# Patient Record
Sex: Male | Born: 1965 | Race: White | Hispanic: No | Marital: Married | Smoking: Never smoker
Health system: Southern US, Community
[De-identification: ages and names within clinical notes are randomized; demographics above are authoritative.]

## PROBLEM LIST (undated history)

## (undated) DIAGNOSIS — G4730 Sleep apnea, unspecified: Secondary | ICD-10-CM

## (undated) DIAGNOSIS — I10 Essential (primary) hypertension: Secondary | ICD-10-CM

## (undated) DIAGNOSIS — E119 Type 2 diabetes mellitus without complications: Secondary | ICD-10-CM

## (undated) DIAGNOSIS — I4891 Unspecified atrial fibrillation: Secondary | ICD-10-CM

## (undated) DIAGNOSIS — J342 Deviated nasal septum: Secondary | ICD-10-CM

## (undated) DIAGNOSIS — Z973 Presence of spectacles and contact lenses: Secondary | ICD-10-CM

## (undated) HISTORY — DX: Unspecified atrial fibrillation: I48.91

## (undated) HISTORY — PX: HERNIA REPAIR: SHX51

---

## 1986-08-09 HISTORY — PX: ANKLE FRACTURE SURGERY: SHX122

## 2013-11-19 ENCOUNTER — Ambulatory Visit (HOSPITAL_COMMUNITY)
Admission: RE | Admit: 2013-11-19 | Discharge: 2013-11-19 | Disposition: A | Discharge status: Home / Self Care | Payer: BC Managed Care – PPO | Source: Ambulatory Visit | Attending: Internal Medicine | Admitting: Internal Medicine

## 2013-11-19 ENCOUNTER — Other Ambulatory Visit (HOSPITAL_COMMUNITY): Payer: Self-pay | Admitting: Internal Medicine

## 2013-11-19 DIAGNOSIS — R911 Solitary pulmonary nodule: Secondary | ICD-10-CM | POA: Insufficient documentation

## 2013-11-19 DIAGNOSIS — R059 Cough, unspecified: Secondary | ICD-10-CM

## 2013-11-19 DIAGNOSIS — R042 Hemoptysis: Secondary | ICD-10-CM

## 2013-11-19 DIAGNOSIS — J438 Other emphysema: Secondary | ICD-10-CM | POA: Insufficient documentation

## 2013-11-19 DIAGNOSIS — R509 Fever, unspecified: Secondary | ICD-10-CM | POA: Insufficient documentation

## 2013-11-19 DIAGNOSIS — R918 Other nonspecific abnormal finding of lung field: Secondary | ICD-10-CM | POA: Insufficient documentation

## 2013-11-19 DIAGNOSIS — R05 Cough: Secondary | ICD-10-CM

## 2013-11-20 ENCOUNTER — Other Ambulatory Visit (HOSPITAL_COMMUNITY): Payer: Self-pay | Admitting: Internal Medicine

## 2013-11-20 DIAGNOSIS — J189 Pneumonia, unspecified organism: Secondary | ICD-10-CM

## 2013-11-22 ENCOUNTER — Ambulatory Visit (HOSPITAL_COMMUNITY): Payer: BC Managed Care – PPO

## 2015-05-16 ENCOUNTER — Other Ambulatory Visit (HOSPITAL_COMMUNITY): Payer: Self-pay | Admitting: Respiratory Therapy

## 2015-05-16 DIAGNOSIS — G4730 Sleep apnea, unspecified: Secondary | ICD-10-CM

## 2015-08-27 ENCOUNTER — Institutional Professional Consult (permissible substitution): Payer: Self-pay | Admitting: Neurology

## 2015-09-06 ENCOUNTER — Ambulatory Visit (HOSPITAL_BASED_OUTPATIENT_CLINIC_OR_DEPARTMENT_OTHER): Payer: BLUE CROSS/BLUE SHIELD | Attending: Pulmonary Disease | Admitting: Sleep Medicine

## 2015-09-06 VITALS — Ht 71.0 in | Wt 330.0 lb

## 2015-09-06 DIAGNOSIS — I493 Ventricular premature depolarization: Secondary | ICD-10-CM | POA: Insufficient documentation

## 2015-09-06 DIAGNOSIS — G4730 Sleep apnea, unspecified: Secondary | ICD-10-CM

## 2015-09-06 DIAGNOSIS — G4733 Obstructive sleep apnea (adult) (pediatric): Secondary | ICD-10-CM

## 2015-09-12 NOTE — Sleep Study (Signed)
  Hissop A. Merlene Laughter, MD     [email or web address]             NOCTURNAL POLYSOMNOGRAPHY   LOCATION: [facility]  Patient Name: Kenneth Bridges, Kenneth Bridges Date: 09/06/2015 Gender: Not Specified D.O.B: 1966/07/08 Age (years): 3 Referring Provider: Not Available Height (inches): 71 Interpreting Physician: Phillips Odor MD, ABSM Weight (lbs): 330 RPSGT: Rosebud Poles BMI: 46 MRN: [id number] Neck Size: 21.00 CLINICAL INFORMATION Sleep Study Type: Split Night CPAP Indication for sleep study: N/A Epworth Sleepiness Score: SLEEP STUDY TECHNIQUE As per the AASM Manual for the Scoring of Sleep and Associated Events v2.3 (April 2016) with a hypopnea requiring 4% desaturations. The channels recorded and monitored were frontal, central and occipital EEG, electrooculogram (EOG), submentalis EMG (chin), nasal and oral airflow, thoracic and abdominal wall motion, anterior tibialis EMG, snore microphone, electrocardiogram, and pulse oximetry. Continuous positive airway pressure (CPAP) was initiated when the patient met split night criteria and was titrated according to treat sleep-disordered breathing. MEDICATIONS Medications taken by the patient : N/A Medications administered by patient during sleep study : No sleep medicine administered. Prior to Admission medications   Not on File    RESPIRATORY PARAMETERS Diagnostic Total AHI (/hr): 82.3 RDI (/hr): 82.3 OA Index (/hr): 30.3 CA Index (/hr): 0.0 REM AHI (/hr): 76.4 NREM AHI (/hr): 83.1 Supine AHI (/hr): 81.7 Non-supine AHI (/hr): 84.71 Min O2 Sat (%): 70.00 Mean O2 (%): 89.72 Time below 88% (min): 53.1     Titration Optimal Pressure (cm): 13 AHI at Optimal Pressure (/hr): 11 Min O2 at Optimal Pressure (%): 89.00 Supine % at Optimal (%): N/A Sleep % at Optimal (%): N/A     SLEEP ARCHITECTURE The recording time for the entire night was 399.1 minutes. During a baseline period of 164.1 minutes, the patient slept for  148.7 minutes in REM and nonREM, yielding a sleep efficiency of 90.6%. Sleep onset after lights out was 7.9 minutes with a REM latency of 90.5 minutes. The patient spent 8.74% of the night in stage N1 sleep, 80.16% in stage N2 sleep, 0.00% in stage N3 and 11.10% in REM. During the titration period of 224.3 minutes, the patient slept for 203.7 minutes in REM and nonREM, yielding a sleep efficiency of 90.8%. Sleep onset after CPAP initiation was 6.7 minutes with a REM latency of 60.0 minutes. The patient spent 10.56% of the night in stage N1 sleep, 27.57% in stage N2 sleep, 26.52% in stage N3 and 35.35% in REM. CARDIAC DATA The 2 lead EKG demonstrated sinus rhythm. The mean heart rate was 71.52 beats per minute. Other EKG findings include: PVCs. LEG MOVEMENT DATA The total Periodic Limb Movements of Sleep (PLMS) were 1. The PLMS index was 0.17.  IMPRESSIONS - Severe obstructive sleep apnea occurred during the diagnostic portion of the study (AHI = 82.3/hour). The optimal CPAP is 13.   Delano Metz, MD Diplomate, American Board of Sleep Medicine.

## 2015-11-23 DIAGNOSIS — G4733 Obstructive sleep apnea (adult) (pediatric): Secondary | ICD-10-CM | POA: Diagnosis not present

## 2015-12-01 DIAGNOSIS — G4733 Obstructive sleep apnea (adult) (pediatric): Secondary | ICD-10-CM | POA: Diagnosis not present

## 2015-12-04 DIAGNOSIS — E291 Testicular hypofunction: Secondary | ICD-10-CM | POA: Diagnosis not present

## 2015-12-04 DIAGNOSIS — I10 Essential (primary) hypertension: Secondary | ICD-10-CM | POA: Diagnosis not present

## 2015-12-04 DIAGNOSIS — R7301 Impaired fasting glucose: Secondary | ICD-10-CM | POA: Diagnosis not present

## 2015-12-09 DIAGNOSIS — R7301 Impaired fasting glucose: Secondary | ICD-10-CM | POA: Diagnosis not present

## 2015-12-09 DIAGNOSIS — I10 Essential (primary) hypertension: Secondary | ICD-10-CM | POA: Diagnosis not present

## 2015-12-09 DIAGNOSIS — E782 Mixed hyperlipidemia: Secondary | ICD-10-CM | POA: Diagnosis not present

## 2015-12-09 DIAGNOSIS — E291 Testicular hypofunction: Secondary | ICD-10-CM | POA: Diagnosis not present

## 2015-12-23 DIAGNOSIS — G4733 Obstructive sleep apnea (adult) (pediatric): Secondary | ICD-10-CM | POA: Diagnosis not present

## 2016-01-22 DIAGNOSIS — G4733 Obstructive sleep apnea (adult) (pediatric): Secondary | ICD-10-CM | POA: Diagnosis not present

## 2016-01-22 DIAGNOSIS — J343 Hypertrophy of nasal turbinates: Secondary | ICD-10-CM | POA: Insufficient documentation

## 2016-01-22 DIAGNOSIS — J342 Deviated nasal septum: Secondary | ICD-10-CM | POA: Diagnosis not present

## 2016-01-23 DIAGNOSIS — G4733 Obstructive sleep apnea (adult) (pediatric): Secondary | ICD-10-CM | POA: Diagnosis not present

## 2016-02-04 ENCOUNTER — Other Ambulatory Visit: Payer: Self-pay | Admitting: Otolaryngology

## 2016-02-04 ENCOUNTER — Encounter (HOSPITAL_COMMUNITY): Payer: Self-pay

## 2016-02-04 ENCOUNTER — Encounter (HOSPITAL_COMMUNITY)
Admission: RE | Admit: 2016-02-04 | Discharge: 2016-02-04 | Disposition: A | Discharge status: Home / Self Care | Payer: BLUE CROSS/BLUE SHIELD | Source: Ambulatory Visit | Attending: Otolaryngology | Admitting: Otolaryngology

## 2016-02-04 DIAGNOSIS — J343 Hypertrophy of nasal turbinates: Secondary | ICD-10-CM | POA: Diagnosis not present

## 2016-02-04 DIAGNOSIS — Z01818 Encounter for other preprocedural examination: Secondary | ICD-10-CM | POA: Insufficient documentation

## 2016-02-04 DIAGNOSIS — I10 Essential (primary) hypertension: Secondary | ICD-10-CM | POA: Diagnosis not present

## 2016-02-04 DIAGNOSIS — J342 Deviated nasal septum: Secondary | ICD-10-CM | POA: Insufficient documentation

## 2016-02-04 DIAGNOSIS — Z01812 Encounter for preprocedural laboratory examination: Secondary | ICD-10-CM | POA: Insufficient documentation

## 2016-02-04 HISTORY — DX: Sleep apnea, unspecified: G47.30

## 2016-02-04 HISTORY — DX: Presence of spectacles and contact lenses: Z97.3

## 2016-02-04 HISTORY — DX: Deviated nasal septum: J34.2

## 2016-02-04 HISTORY — DX: Essential (primary) hypertension: I10

## 2016-02-04 LAB — BASIC METABOLIC PANEL
ANION GAP: 9 (ref 5–15)
BUN: 14 mg/dL (ref 6–20)
CALCIUM: 9.3 mg/dL (ref 8.9–10.3)
CO2: 25 mmol/L (ref 22–32)
Chloride: 104 mmol/L (ref 101–111)
Creatinine, Ser: 0.96 mg/dL (ref 0.61–1.24)
Glucose, Bld: 104 mg/dL — ABNORMAL HIGH (ref 65–99)
POTASSIUM: 3.9 mmol/L (ref 3.5–5.1)
Sodium: 138 mmol/L (ref 135–145)

## 2016-02-04 LAB — CBC
HEMATOCRIT: 43.3 % (ref 39.0–52.0)
HEMOGLOBIN: 15.3 g/dL (ref 13.0–17.0)
MCH: 30.5 pg (ref 26.0–34.0)
MCHC: 35.3 g/dL (ref 30.0–36.0)
MCV: 86.3 fL (ref 78.0–100.0)
Platelets: 167 10*3/uL (ref 150–400)
RBC: 5.02 MIL/uL (ref 4.22–5.81)
RDW: 12.9 % (ref 11.5–15.5)
WBC: 8 10*3/uL (ref 4.0–10.5)

## 2016-02-04 NOTE — Progress Notes (Signed)
Pt denies SOB, chest pain, and being under the care of a cardiologist. Pt denies having a stress test, echo and cardiac cath. Pt denies having an EKG and chest x ray within the last 12 months. Pt denies having labs drawn within the last 2 weeks.

## 2016-02-13 ENCOUNTER — Ambulatory Visit (HOSPITAL_COMMUNITY): Payer: BLUE CROSS/BLUE SHIELD | Admitting: Anesthesiology

## 2016-02-13 ENCOUNTER — Encounter (HOSPITAL_COMMUNITY)
Admission: RE | Disposition: A | Discharge status: Home / Self Care | Payer: Self-pay | Source: Ambulatory Visit | Attending: Otolaryngology

## 2016-02-13 ENCOUNTER — Observation Stay (HOSPITAL_COMMUNITY)
Admission: RE | Admit: 2016-02-13 | Discharge: 2016-02-14 | Disposition: A | Discharge status: Home / Self Care | Payer: BLUE CROSS/BLUE SHIELD | Source: Ambulatory Visit | Attending: Otolaryngology | Admitting: Otolaryngology

## 2016-02-13 ENCOUNTER — Encounter (HOSPITAL_COMMUNITY): Payer: Self-pay | Admitting: Surgery

## 2016-02-13 DIAGNOSIS — I10 Essential (primary) hypertension: Secondary | ICD-10-CM | POA: Diagnosis not present

## 2016-02-13 DIAGNOSIS — J343 Hypertrophy of nasal turbinates: Secondary | ICD-10-CM | POA: Diagnosis not present

## 2016-02-13 DIAGNOSIS — Z6841 Body Mass Index (BMI) 40.0 and over, adult: Secondary | ICD-10-CM | POA: Insufficient documentation

## 2016-02-13 DIAGNOSIS — J342 Deviated nasal septum: Principal | ICD-10-CM | POA: Diagnosis present

## 2016-02-13 DIAGNOSIS — G4733 Obstructive sleep apnea (adult) (pediatric): Secondary | ICD-10-CM | POA: Diagnosis not present

## 2016-02-13 HISTORY — PX: NASAL SEPTOPLASTY W/ TURBINOPLASTY: SHX2070

## 2016-02-13 SURGERY — SEPTOPLASTY, NOSE, WITH NASAL TURBINATE REDUCTION
Anesthesia: General | Site: Nose | Laterality: Bilateral

## 2016-02-13 MED ORDER — PROPOFOL 10 MG/ML IV BOLUS
INTRAVENOUS | Status: AC
  Filled 2016-02-13: qty 20

## 2016-02-13 MED ORDER — KCL IN DEXTROSE-NACL 20-5-0.45 MEQ/L-%-% IV SOLN
INTRAVENOUS | Status: DC
  Administered 2016-02-13: 13:00:00 via INTRAVENOUS
  Filled 2016-02-13: qty 1000

## 2016-02-13 MED ORDER — MIDAZOLAM HCL 5 MG/5ML IJ SOLN
INTRAMUSCULAR | Status: DC | PRN
  Administered 2016-02-13: 2 mg via INTRAVENOUS

## 2016-02-13 MED ORDER — HYDRALAZINE HCL 20 MG/ML IJ SOLN
INTRAMUSCULAR | Status: AC
  Administered 2016-02-13: 11:00:00
  Filled 2016-02-13: qty 1

## 2016-02-13 MED ORDER — SUCCINYLCHOLINE CHLORIDE 20 MG/ML IJ SOLN
INTRAMUSCULAR | Status: DC | PRN
  Administered 2016-02-13: 100 mg via INTRAVENOUS

## 2016-02-13 MED ORDER — SUGAMMADEX SODIUM 200 MG/2ML IV SOLN
INTRAVENOUS | Status: DC | PRN
  Administered 2016-02-13: 200 mg via INTRAVENOUS

## 2016-02-13 MED ORDER — DEXAMETHASONE SODIUM PHOSPHATE 10 MG/ML IJ SOLN
INTRAMUSCULAR | Status: AC
  Filled 2016-02-13: qty 1

## 2016-02-13 MED ORDER — PROPOFOL 10 MG/ML IV BOLUS
INTRAVENOUS | Status: DC | PRN
  Administered 2016-02-13: 200 mg via INTRAVENOUS
  Administered 2016-02-13: 50 mg via INTRAVENOUS

## 2016-02-13 MED ORDER — 0.9 % SODIUM CHLORIDE (POUR BTL) OPTIME
TOPICAL | Status: DC | PRN
  Administered 2016-02-13: 1000 mL

## 2016-02-13 MED ORDER — DEXAMETHASONE SODIUM PHOSPHATE 10 MG/ML IJ SOLN
INTRAMUSCULAR | Status: DC | PRN
  Administered 2016-02-13: 5 mg via INTRAVENOUS

## 2016-02-13 MED ORDER — PROMETHAZINE HCL 25 MG PO TABS: 25.0000 mg | ORAL_TABLET | Freq: Four times a day (QID) | ORAL | Status: DC | PRN

## 2016-02-13 MED ORDER — MIDAZOLAM HCL 2 MG/2ML IJ SOLN
INTRAMUSCULAR | Status: AC
  Filled 2016-02-13: qty 2

## 2016-02-13 MED ORDER — MUPIROCIN 2 % EX OINT
TOPICAL_OINTMENT | CUTANEOUS | Status: AC
  Filled 2016-02-13: qty 22

## 2016-02-13 MED ORDER — HYDRALAZINE HCL 20 MG/ML IJ SOLN
10.0000 mg | Freq: Once | INTRAMUSCULAR | Status: AC
  Administered 2016-02-13: 10 mg via INTRAVENOUS

## 2016-02-13 MED ORDER — ROCURONIUM BROMIDE 100 MG/10ML IV SOLN
INTRAVENOUS | Status: DC | PRN
  Administered 2016-02-13: 40 mg via INTRAVENOUS

## 2016-02-13 MED ORDER — ROCURONIUM BROMIDE 50 MG/5ML IV SOLN
INTRAVENOUS | Status: AC
  Filled 2016-02-13: qty 1

## 2016-02-13 MED ORDER — PROMETHAZINE HCL 25 MG RE SUPP: 25.0000 mg | Freq: Four times a day (QID) | RECTAL | Status: DC | PRN

## 2016-02-13 MED ORDER — HYDROCODONE-ACETAMINOPHEN 5-325 MG PO TABS
1.0000 | ORAL_TABLET | ORAL | Status: DC | PRN
  Administered 2016-02-13 – 2016-02-14 (×3): 2 via ORAL
  Filled 2016-02-13 (×3): qty 2

## 2016-02-13 MED ORDER — LIDOCAINE 2% (20 MG/ML) 5 ML SYRINGE
INTRAMUSCULAR | Status: AC
  Filled 2016-02-13: qty 5

## 2016-02-13 MED ORDER — MEPERIDINE HCL 25 MG/ML IJ SOLN: 6.2500 mg | INTRAMUSCULAR | Status: DC | PRN

## 2016-02-13 MED ORDER — FENTANYL CITRATE (PF) 100 MCG/2ML IJ SOLN
INTRAMUSCULAR | Status: DC | PRN
  Administered 2016-02-13 (×2): 50 ug via INTRAVENOUS
  Administered 2016-02-13: 100 ug via INTRAVENOUS

## 2016-02-13 MED ORDER — LIDOCAINE-EPINEPHRINE 1 %-1:100000 IJ SOLN
INTRAMUSCULAR | Status: AC
  Filled 2016-02-13: qty 1

## 2016-02-13 MED ORDER — IRBESARTAN 300 MG PO TABS
300.0000 mg | ORAL_TABLET | Freq: Every day | ORAL | Status: DC
  Administered 2016-02-13: 300 mg via ORAL
  Filled 2016-02-13: qty 1

## 2016-02-13 MED ORDER — CEFAZOLIN SODIUM-DEXTROSE 2-3 GM-% IV SOLR
2.0000 g | Freq: Once | INTRAVENOUS | Status: DC
  Administered 2016-02-13: 2 g via INTRAVENOUS

## 2016-02-13 MED ORDER — SUGAMMADEX SODIUM 500 MG/5ML IV SOLN
INTRAVENOUS | Status: AC
  Filled 2016-02-13: qty 5

## 2016-02-13 MED ORDER — PHENYLEPHRINE HCL 10 MG/ML IJ SOLN
INTRAMUSCULAR | Status: DC | PRN
  Administered 2016-02-13: 80 ug via INTRAVENOUS

## 2016-02-13 MED ORDER — PROMETHAZINE HCL 25 MG/ML IJ SOLN: 6.2500 mg | INTRAMUSCULAR | Status: DC | PRN

## 2016-02-13 MED ORDER — CHLORHEXIDINE GLUCONATE CLOTH 2 % EX PADS: 6.0000 | MEDICATED_PAD | Freq: Once | CUTANEOUS | Status: DC

## 2016-02-13 MED ORDER — HYDROMORPHONE HCL 1 MG/ML IJ SOLN: 0.2500 mg | INTRAMUSCULAR | Status: DC | PRN

## 2016-02-13 MED ORDER — CEPHALEXIN 500 MG PO CAPS: 500.0000 mg | ORAL_CAPSULE | Freq: Three times a day (TID) | ORAL | Status: DC

## 2016-02-13 MED ORDER — ONDANSETRON HCL 4 MG/2ML IJ SOLN
INTRAMUSCULAR | Status: DC | PRN
  Administered 2016-02-13: 4 mg via INTRAVENOUS

## 2016-02-13 MED ORDER — LACTATED RINGERS IV SOLN
INTRAVENOUS | Status: DC
  Administered 2016-02-13: 09:00:00 via INTRAVENOUS

## 2016-02-13 MED ORDER — OXYMETAZOLINE HCL 0.05 % NA SOLN
NASAL | Status: DC | PRN
  Administered 2016-02-13: 1

## 2016-02-13 MED ORDER — HYDROCODONE-ACETAMINOPHEN 5-325 MG PO TABS: 1.0000 | ORAL_TABLET | Freq: Four times a day (QID) | ORAL | Status: DC | PRN

## 2016-02-13 MED ORDER — KCL IN DEXTROSE-NACL 20-5-0.45 MEQ/L-%-% IV SOLN
INTRAVENOUS | Status: AC
  Filled 2016-02-13: qty 1000

## 2016-02-13 MED ORDER — CEFAZOLIN SODIUM-DEXTROSE 2-4 GM/100ML-% IV SOLN
INTRAVENOUS | Status: AC
  Filled 2016-02-13: qty 100

## 2016-02-13 MED ORDER — HYDROCHLOROTHIAZIDE 12.5 MG PO CAPS
12.5000 mg | ORAL_CAPSULE | Freq: Every day | ORAL | Status: DC
  Administered 2016-02-13: 12.5 mg via ORAL
  Filled 2016-02-13: qty 1

## 2016-02-13 MED ORDER — ONDANSETRON HCL 4 MG/2ML IJ SOLN
INTRAMUSCULAR | Status: AC
  Filled 2016-02-13: qty 2

## 2016-02-13 MED ORDER — OXYMETAZOLINE HCL 0.05 % NA SOLN
NASAL | Status: AC
  Filled 2016-02-13: qty 15

## 2016-02-13 MED ORDER — MORPHINE SULFATE (PF) 2 MG/ML IV SOLN: 2.0000 mg | INTRAVENOUS | Status: DC | PRN

## 2016-02-13 MED ORDER — CEPHALEXIN 500 MG PO CAPS
500.0000 mg | ORAL_CAPSULE | Freq: Three times a day (TID) | ORAL | Status: DC
  Administered 2016-02-13 – 2016-02-14 (×3): 500 mg via ORAL
  Filled 2016-02-13 (×3): qty 1

## 2016-02-13 MED ORDER — MUPIROCIN 2 % EX OINT
TOPICAL_OINTMENT | CUTANEOUS | Status: DC | PRN
  Administered 2016-02-13: 1 via NASAL

## 2016-02-13 MED ORDER — TELMISARTAN-HCTZ 80-12.5 MG PO TABS: 1.0000 | ORAL_TABLET | Freq: Every day | ORAL | Status: DC

## 2016-02-13 MED ORDER — HYDRALAZINE HCL 20 MG/ML IJ SOLN
5.0000 mg | Freq: Once | INTRAMUSCULAR | Status: AC
  Administered 2016-02-13: 5 mg via INTRAVENOUS

## 2016-02-13 MED ORDER — LIDOCAINE-EPINEPHRINE 1 %-1:100000 IJ SOLN
INTRAMUSCULAR | Status: DC | PRN
  Administered 2016-02-13: 20 mL

## 2016-02-13 MED ORDER — FENTANYL CITRATE (PF) 250 MCG/5ML IJ SOLN
INTRAMUSCULAR | Status: AC
  Filled 2016-02-13: qty 5

## 2016-02-13 SURGICAL SUPPLY — 19 items
BLADE INF TURB ROT M4 2 5PK (BLADE) ×2 IMPLANT
CANISTER SUCTION 2500CC (MISCELLANEOUS) ×2 IMPLANT
CRADLE DONUT ADULT HEAD (MISCELLANEOUS) IMPLANT
DRAPE PROXIMA HALF (DRAPES) ×2 IMPLANT
DRSG TELFA 3X8 NADH (GAUZE/BANDAGES/DRESSINGS) ×2 IMPLANT
GLOVE BIO SURGEON STRL SZ7.5 (GLOVE) ×2 IMPLANT
GOWN STRL REUS W/ TWL LRG LVL3 (GOWN DISPOSABLE) ×2 IMPLANT
GOWN STRL REUS W/TWL LRG LVL3 (GOWN DISPOSABLE) ×2
KIT BASIN OR (CUSTOM PROCEDURE TRAY) ×2 IMPLANT
KIT ROOM TURNOVER OR (KITS) ×2 IMPLANT
NEEDLE HYPO 25GX1X1/2 BEV (NEEDLE) IMPLANT
NEEDLE HYPO 25X1 1.5 SAFETY (NEEDLE) ×2 IMPLANT
NS IRRIG 1000ML POUR BTL (IV SOLUTION) ×2 IMPLANT
PAD ARMBOARD 7.5X6 YLW CONV (MISCELLANEOUS) ×2 IMPLANT
PATTIES SURGICAL .5 X3 (DISPOSABLE) ×2 IMPLANT
SPLINT NASAL DOYLE BI-VL (GAUZE/BANDAGES/DRESSINGS) ×2 IMPLANT
SUT CHROMIC 4 0 PS 2 18 (SUTURE) ×2 IMPLANT
SUT CHROMIC GUT 2 0 PS 2 27 (SUTURE) ×4 IMPLANT
TRAY ENT MC OR (CUSTOM PROCEDURE TRAY) ×2 IMPLANT

## 2016-02-13 NOTE — Transfer of Care (Signed)
Immediate Anesthesia Transfer of Care Note  Patient: Kenneth Bridges  Procedure(s) Performed: Procedure(s): NASAL SEPTOPLASTY WITH TURBINATE REDUCTION (Bilateral)  Patient Location: PACU  Anesthesia Type:General  Level of Consciousness: awake, alert  and oriented  Airway & Oxygen Therapy: Patient Spontanous Breathing and Patient connected to nasal cannula oxygen  Post-op Assessment: Report given to RN, Post -op Vital signs reviewed and stable and Patient moving all extremities X 4  Post vital signs: Reviewed and stable  Last Vitals:  Filed Vitals:   02/13/16 0804  BP: 165/94  Pulse: 63  Temp: 37.1 C  Resp: 18    Last Pain: There were no vitals filed for this visit.    Patients Stated Pain Goal: 4 (02/13/16 0757)  Complications: No apparent anesthesia complications

## 2016-02-13 NOTE — Brief Op Note (Signed)
02/13/2016  10:45 AM  PATIENT:  Kenneth Bridges  50 y.o. male  PRE-OPERATIVE DIAGNOSIS:  deviated septum/turbinate hypertrophy  POST-OPERATIVE DIAGNOSIS:  deviated septum/turbinate hypertrophy  PROCEDURE:  Procedure(s): NASAL SEPTOPLASTY WITH TURBINATE REDUCTION (Bilateral)  SURGEON:  Surgeon(s) and Role:    * Christia Readingwight Skyler Dusing, MD - Primary  PHYSICIAN ASSISTANT:   ASSISTANTS: none   ANESTHESIA:   general  EBL:     BLOOD ADMINISTERED:none  DRAINS: none   LOCAL MEDICATIONS USED:  LIDOCAINE   SPECIMEN:  No Specimen  DISPOSITION OF SPECIMEN:  N/A  COUNTS:  YES  TOURNIQUET:  * No tourniquets in log *  DICTATION: .Other Dictation: Dictation Number [id number]  PLAN OF CARE: Admit for overnight observation  PATIENT DISPOSITION:  PACU - hemodynamically stable.   Delay start of Pharmacological VTE agent (>24hrs) due to surgical blood loss or risk of bleeding: yes

## 2016-02-13 NOTE — Anesthesia Preprocedure Evaluation (Signed)
Anesthesia Evaluation  Patient identified by MRN, date of birth, ID band Patient awake    Reviewed: Allergy & Precautions, NPO status , Patient's Chart, lab work & pertinent test results  Airway Mallampati: II  TM Distance: >3 FB Neck ROM: Full    Dental no notable dental hx.    Pulmonary sleep apnea ,    Pulmonary exam normal breath sounds clear to auscultation       Cardiovascular hypertension, Pt. on medications Normal cardiovascular exam Rhythm:Regular Rate:Normal     Neuro/Psych negative neurological ROS  negative psych ROS   GI/Hepatic negative GI ROS, Neg liver ROS,   Endo/Other  Morbid obesity  Renal/GU negative Renal ROS     Musculoskeletal negative musculoskeletal ROS (+)   Abdominal   Peds  Hematology negative hematology ROS (+)   Anesthesia Other Findings   Reproductive/Obstetrics negative OB ROS                             Anesthesia Physical Anesthesia Plan  ASA: III  Anesthesia Plan: General   Post-op Pain Management:    Induction: Intravenous  Airway Management Planned: Oral ETT  Additional Equipment:   Intra-op Plan:   Post-operative Plan: Extubation in OR  Informed Consent: I have reviewed the patients History and Physical, chart, labs and discussed the procedure including the risks, benefits and alternatives for the proposed anesthesia with the patient or authorized representative who has indicated his/her understanding and acceptance.   Dental advisory given  Plan Discussed with: CRNA  Anesthesia Plan Comments:         Anesthesia Quick Evaluation

## 2016-02-13 NOTE — H&P (Signed)
Kenneth BellsCharles Bridges is an 50 y.o. male.   Chief Complaint: nasal obstruction HPI: 50 year old male with sleep apnea and nasal obstruction due to septal deviation and turbinate hypertrophy.  Nasal steroid spray has not been very helpful.  Past Medical History  Diagnosis Date  . Wears glasses   . Hypertension   . 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 Sleep apnea     Past Surgical History  Procedure Laterality Date  . Hernia repair      Family History  Problem Relation Age of Onset  . Cancer Mother    Social History:  reports that he has never smoked. He has never used smokeless tobacco. He reports that he drinks alcohol. He reports that he does not use illicit drugs.  Allergies: Not on File  Medications Prior to Admission  Medication Sig Dispense Refill  . telmisartan-hydrochlorothiazide (MICARDIS HCT) 80-12.5 MG tablet Take 1 tablet by mouth daily.      No results found for this or any previous visit (from the past 48 hour(s)). No results found.  Review of Systems  All other systems reviewed and are negative.   Blood pressure 165/94, pulse 63, temperature 98.8 F (37.1 C), resp. rate 18, height 5\' 11"  (1.803 m), weight 143.337 kg (316 lb), SpO2 94 %. Physical Exam  Constitutional: He is oriented to person, place, and time. He appears well-developed and well-nourished. No distress.  HENT:  Head: Normocephalic and atraumatic.  Right Ear: External ear normal.  Left Ear: External ear normal.  Nose: Nose normal.  Mouth/Throat: Oropharynx is clear and moist.  Eyes: Conjunctivae and EOM are normal. Pupils are equal, round, and reactive to light.  Neck: Normal range of motion. Neck supple.  Cardiovascular: Normal rate.   Respiratory: Effort normal.  Musculoskeletal: Normal range of motion.  Neurological: He is alert and oriented to person, place, and time. No cranial nerve deficit.  Skin: Skin is warm and dry.  Psychiatric: He has a normal  mood and affect. His behavior is normal. Judgment and thought content normal.     Assessment/Plan Septal deviation, turbinate hypertrophy and sleep apnea To OR for septoplasty and turbinate reduction.  Overnight observation after surgery.  Christia ReadingBATES, Sylver Vantassell, MD 02/13/2016, 9:27 AM

## 2016-02-13 NOTE — Anesthesia Procedure Notes (Signed)
Procedure Name: Intubation Date/Time: 02/13/2016 10:05 AM Performed by: Marena ChancyBECKNER, Markeem Noreen S Pre-anesthesia Checklist: Patient identified, Emergency Drugs available, Suction available and Patient being monitored Patient Re-evaluated:Patient Re-evaluated prior to inductionOxygen Delivery Method: Circle system utilized Preoxygenation: Pre-oxygenation with 100% oxygen Intubation Type: IV induction Ventilation: Oral airway inserted - appropriate to patient size and Two handed mask ventilation required Laryngoscope Size: Glidescope and 3 Grade View: Grade I Tube type: Oral Tube size: 7.5 mm Number of attempts: 1 Airway Equipment and Method: Rigid stylet Placement Confirmation: ETT inserted through vocal cords under direct vision,  positive ETCO2 and breath sounds checked- equal and bilateral Secured at: 22 cm Tube secured with: Tape Dental Injury: Teeth and Oropharynx as per pre-operative assessment

## 2016-02-14 DIAGNOSIS — J342 Deviated nasal septum: Secondary | ICD-10-CM | POA: Diagnosis not present

## 2016-02-14 DIAGNOSIS — J343 Hypertrophy of nasal turbinates: Secondary | ICD-10-CM | POA: Diagnosis not present

## 2016-02-14 DIAGNOSIS — G4733 Obstructive sleep apnea (adult) (pediatric): Secondary | ICD-10-CM | POA: Diagnosis not present

## 2016-02-14 DIAGNOSIS — Z6841 Body Mass Index (BMI) 40.0 and over, adult: Secondary | ICD-10-CM | POA: Diagnosis not present

## 2016-02-14 DIAGNOSIS — I10 Essential (primary) hypertension: Secondary | ICD-10-CM | POA: Diagnosis not present

## 2016-02-14 NOTE — Op Note (Signed)
NAMETORIE, PRIEBE NO.:  [id number]  MEDICAL RECORD NO.:  [id number]  LOCATION:  [facility]                        FACILITY:  [facility]  PHYSICIAN:  Antony Contras, MD     DATE OF BIRTH:  August 07, 1966  DATE OF PROCEDURE:  02/13/2016 DATE OF DISCHARGE:                              OPERATIVE REPORT   PREOPERATIVE DIAGNOSES: 1. Nasal septal deviation. 2. Inferior turbinate hypertrophy. 3. Obstructive sleep apnea.  POSTOPERATIVE DIAGNOSES: 1. Nasal septal deviation. 2. Inferior turbinate hypertrophy. 3. Obstructive sleep apnea.  PROCEDURES: 1. Nasal septoplasty. 2. Bilateral inferior turbinate reductions.  SURGEON:  Antony Contras, MD  ANESTHESIA:  General endotracheal anesthesia.  COMPLICATIONS:  None.  INDICATION:  The patient is a 50 year old male with a long history of sleep apnea and nasal obstruction, found to be related to septal deviation and turbinate hypertrophy.  He has had limited benefit from nasal steroid therapy and presents to the operating room for surgical management.  FINDINGS:  The nasal septum had a significant deviation toward the left with a fold in the cartilage as well as a spur inferiorly that extended out in both sides of the nasal passages.  Inferior turbinates were enlarged as well.  DESCRIPTION OF PROCEDURE:  The patient was identified in the holding room, informed consent having been obtained including the discussion of risks, benefits and alternatives, the patient was brought to the operative suite and put on the operative table in supine position. Anesthesia was induced and the patient was intubated by the Anesthesia Team without difficulty.  The patient was given intravenous steroids and antibiotics during the case.  The eyes were taped closed and the midface was prepped and draped in sterile fashion.  Afrin soaked pledgets were placed on both sides of the nose for several minutes and then removed. The nasal septum was  injected on both sides using 1% lidocaine with 1:100,000 epinephrine.  A Killian incision was made on the left side and the subperichondrial flap was elevated down the left side of the septum. The folds and the cartilage were vertical in orientation and trying to dissect the soft tissues off the posterior extent of the fold was difficult and a hole resulted in the soft tissue flap.  The cartilage was incised vertically anterior to the fold and the anterior segment of the fold was then removed using a Therapist, nutritional.  The soft tissues were then elevated down the each side of the posterior extent of the fold at this point successfully.  The remainder of that portion of the quadrangular cartilage was then removed in a piecemeal fashion and it was a portion of the posterior septal bone.  Soft tissues were then dissected off each side of the inferior septum where there was a spur extending to both sides.  An osteotome was then used to remove the septal spur.  At this point, the soft tissues were redraped.  The left- sided flap had a hole and there was also a hole performed on the right side during removal of the spur.  Soft tissues were redraped and the septum was found to be in good midline position.  At this point, the inferior turbinates were injected with 1% lidocaine  with 1:100,000 epinephrine.  The anterior extent of each inferior turbinate was then incised with a 15-blade scalpel and soft tissues were elevated off the underlying bone using a Therapist, nutritionalreer elevator on both sides.  Submucosal tissues were then removed using the microdebrider with a turbinate blade.  Soft tissues were then redraped and the turbinates were lateralized on both sides.  The nasal passages were suctioned.  Doyle splints coated with Bactroban ointment were then placed to both sides of the nasal passages.  The Killian incision was closed with 4-0 chromic suture in a simple interrupted fashion.  The stents were then placed  in the proper position and secured with a single 2-0 chromic through and through mattress suture.  The throat was suctioned and the patient was returned to anesthesia for wake-up.  He was extubated and moved to the recovery room in stable condition.     Antony Contraswight D Nemiah Bubar, MD     DDB/MEDQ  D:  02/13/2016  T:  02/14/2016  Job:  [id number]

## 2016-02-14 NOTE — Progress Notes (Signed)
Pt discharged to home via ambulatory accompanied by wife.  DC instructions reviewed and copy given.  Rx given and explained.

## 2016-02-14 NOTE — Discharge Summary (Signed)
Physician Discharge Summary  Patient ID: Kenneth Bridges MRN: [id number] DOB/AGE: 52-28-1967 52 y.o.  Admit date: 02/13/2016 Discharge date: 02/14/2016  Admission Diagnoses:  Discharge Diagnoses:  Active Problems:   Nasal septal deviation   Discharged Condition: good  Hospital Course: Patient admitted to the hospital after septoplasty and turbinate reduction for his sleep apnea. He is done well. No problems or complaints. Bleeding has stopped. He has no sleepiness and is fully awake this morning. He is ready to go home. Will follow-up on Wednesday with [name] sooner if he has any issues.  Consults: None  Significant Diagnostic Studies: None  Treatments: surgery: Septoplasty and turbinate reduction  Discharge Exam: Blood pressure 162/87, pulse 61, temperature 97.9 F (36.6 C), temperature source Oral, resp. rate 18, height 5\' 11"  (1.803 m), weight 143.29 kg (315 lb 14.4 oz), SpO2 96 %. Awake and alert. He has some crusting in both sides of his nose but no bleeding. Oral cavity/oropharynx-no lesions. Neck no swelling. Heart is regular. Lungs are normal effort. Abdomen is soft. There is no swelling of the extremities.  Disposition: Final discharge disposition not confirmed  Discharge Instructions    Diet - low sodium heart healthy    Complete by:  As directed      Discharge instructions    Complete by:  As directed   Keep head elevated.  Change drip pad as needed until bleeding stops.  Spray each nasal passage with saline spray every 2-4 hours while awake.  Use Afrin-type spray if bleeding becomes heavy.  No strenuous activity.  Normal diet.     Increase activity slowly    Complete by:  As directed             Medication List    TAKE these medications        cephALEXin 500 MG capsule  Commonly known as:  KEFLEX  Take 1 capsule (500 mg total) by mouth 3 (three) times daily.     HYDROcodone-acetaminophen 5-325 MG tablet  Commonly known as:  NORCO/VICODIN  Take 1-2  tablets by mouth every 6 (six) hours as needed for moderate pain.     telmisartan-hydrochlorothiazide 80-12.5 MG tablet  Commonly known as:  MICARDIS HCT  Take 1 tablet by mouth daily.           Follow-up Information    Follow up with BATES, DWIGHT, MD. Schedule an appointment as soon as possible for a visit on 02/18/2016.   Specialty:  Otolaryngology   Contact information:   [address] [place] [address] [phone]       Signed: Suzanna ObeyBYERS, Kenneth Bridges 02/14/2016, 5:55 AM

## 2016-02-14 NOTE — Anesthesia Postprocedure Evaluation (Signed)
Anesthesia Post Note  Patient: Kenneth Bridges  Procedure(s) Performed: Procedure(s) (LRB): NASAL SEPTOPLASTY WITH TURBINATE REDUCTION (Bilateral)  Patient location during evaluation: PACU Anesthesia Type: General Level of consciousness: sedated and patient cooperative Pain management: pain level controlled Vital Signs Assessment: post-procedure vital signs reviewed and stable Respiratory status: spontaneous breathing Cardiovascular status: stable Anesthetic complications: no    Last Vitals:  Filed Vitals:   02/14/16 0245 02/14/16 0517  BP: 149/79 162/87  Pulse: 66 61  Temp: 37.2 C 36.6 C  Resp: 18 18    Last Pain:  Filed Vitals:   02/14/16 0544  PainSc: 4                  Lewie LoronJohn Dwain Huhn

## 2016-02-16 ENCOUNTER — Encounter (HOSPITAL_COMMUNITY): Payer: Self-pay | Admitting: Otolaryngology

## 2016-02-22 DIAGNOSIS — G4733 Obstructive sleep apnea (adult) (pediatric): Secondary | ICD-10-CM | POA: Diagnosis not present

## 2016-03-17 ENCOUNTER — Telehealth: Payer: Self-pay

## 2016-03-17 NOTE — Telephone Encounter (Signed)
Pt received a triage letter from DS and needs to schedule colonoscopy. 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 He would like to be triaged today if possible. I told him that I would let DS know to be expecting his call.

## 2016-03-24 DIAGNOSIS — G4733 Obstructive sleep apnea (adult) (pediatric): Secondary | ICD-10-CM | POA: Diagnosis not present

## 2016-04-01 NOTE — Telephone Encounter (Signed)
See separate triage.  

## 2016-04-13 NOTE — Telephone Encounter (Signed)
Chronic ETOH use (6 pack every weekend), on Vicodin 1-2 tab q 6 hours. Should have OV to triage for possible need for sedation augmentation.

## 2016-04-13 NOTE — Telephone Encounter (Signed)
Gastroenterology Pre-Procedure Review  Request Date: 03/17/2016 Requesting Physician: Dwana MelenaZack Bridges  PATIENT REVIEW QUESTIONS: The patient responded to the following health history questions as indicated:    1. Diabetes Melitis: no 2. Joint replacements in the past 12 months: no 3. Major health problems in the past 3 months: no 4. Has an artificial valve or MVP: no 5. Has a defibrillator: no 6. Has been advised in past to take antibiotics in advance of a procedure like teeth cleaning: no 7. Family history of colon cancer: No  8. Alcohol Use: YES   A 6 pack on weekends 9. History of sleep apnea: YES    MEDICATIONS & ALLERGIES:    Patient reports the following regarding taking any blood thinners:   Plavix? no Aspirin? no Coumadin? no  Patient confirms/reports the following medications:  Current Outpatient Prescriptions  Medication Sig Dispense Refill  . telmisartan-hydrochlorothiazide (MICARDIS HCT) 80-12.5 MG tablet Take 1 tablet by mouth daily.    . cephALEXin (KEFLEX) 500 MG capsule Take 1 capsule (500 mg total) by mouth 3 (three) times daily. (Patient not taking: Reported on 03/17/2016) 15 capsule 0  . HYDROcodone-acetaminophen (NORCO/VICODIN) 5-325 MG tablet Take 1-2 tablets by mouth every 6 (six) hours as needed for moderate pain. (Patient not taking: Reported on 03/17/2016) 30 tablet 0   No current facility-administered medications for this visit.     Patient confirms/reports the following allergies:  No Known Allergies  No orders of the defined types were placed in this encounter.   AUTHORIZATION INFORMATION Primary Insurance:   ID #:  Group #:  Pre-Cert / Auth required:  Pre-Cert / Auth #:   Secondary Insurance:   ID #:   Group #:  Pre-Cert / Auth required: Pre-Cert / Auth #:   SCHEDULE INFORMATION: Procedure has been scheduled as follows:  Date:                      Time:   Location:   This Gastroenterology Pre-Precedure Review Form is being routed to the following  provider(s): R. Roetta SessionsMichael Rourk, MD

## 2016-04-13 NOTE — Telephone Encounter (Signed)
Pt is aware and OV has been scheduled with Wynne DustEric Gill, NP on 05/04/2016 at 11:00 AM.

## 2016-04-24 DIAGNOSIS — G4733 Obstructive sleep apnea (adult) (pediatric): Secondary | ICD-10-CM | POA: Diagnosis not present

## 2016-05-04 ENCOUNTER — Ambulatory Visit: Payer: BLUE CROSS/BLUE SHIELD | Admitting: Nurse Practitioner

## 2016-05-04 ENCOUNTER — Telehealth: Payer: Self-pay | Admitting: Nurse Practitioner

## 2016-05-04 ENCOUNTER — Encounter: Payer: Self-pay | Admitting: Nurse Practitioner

## 2016-05-04 NOTE — Telephone Encounter (Signed)
PATIENT WAS A NO SHOW AND LETTER SENT  °

## 2016-05-05 ENCOUNTER — Encounter: Payer: Self-pay | Admitting: Gastroenterology

## 2016-05-05 ENCOUNTER — Other Ambulatory Visit: Payer: Self-pay

## 2016-05-05 ENCOUNTER — Ambulatory Visit (INDEPENDENT_AMBULATORY_CARE_PROVIDER_SITE_OTHER): Payer: BLUE CROSS/BLUE SHIELD | Admitting: Gastroenterology

## 2016-05-05 DIAGNOSIS — Z1211 Encounter for screening for malignant neoplasm of colon: Secondary | ICD-10-CM | POA: Diagnosis not present

## 2016-05-05 MED ORDER — PEG 3350-KCL-NA BICARB-NACL 420 G PO SOLR: 4000.0000 mL | ORAL | 0 refills | Status: DC

## 2016-05-05 NOTE — Telephone Encounter (Signed)
Noted  

## 2016-05-05 NOTE — Assessment & Plan Note (Signed)
49 year o21ld male with need for initial screening colonoscopy. No concerning lower or upper GI symptoms. No family history of colon cancer of polyps. Very minimal social alcohol use (a few beers on the weekend) reported. No chronic narcotics or any other medications that would cause issues with sedation. I feel he would do fine with the addition of Phenergan to augment standard sedation.   Proceed with TCS with Dr. Jena Gaussourk in near future: the risks, benefits, and alternatives have been discussed with the patient in detail. The patient states understanding and desires to proceed. Phenergan 25 mg IV on call

## 2016-05-05 NOTE — Patient Instructions (Signed)
We have scheduled you for a colonoscopy with Dr. Rourk in the near future.  Further recommendations to follow!   

## 2016-05-05 NOTE — Progress Notes (Signed)
Primary Care Physician:  Dwana MelenaZack Hall, MD Primary Gastroenterologist:  Dr. Jena Gaussourk   Chief Complaint  Patient presents with  . Colonoscopy    no problems    HPI:   Kenneth Bridges is a 50 y.o. male presenting today at the request of Dr. Margo AyeHall for a colonoscopy. He was brought in for an office visit prior to colonoscopy to assess sedation requirements.   No rectal bleeding. No constipation or diarrhea. No abdominal pain. No nausea or vomiting. No reflux issues. No dysphagia. No prior colonoscopy. No FH of colon cancer. No FH of polyps. He drinks 2 beers on Friday and Saturday night on the weekends. There was a question of narcotic use, but this was only a short time several months ago for a surgery that he had. He does not take any pain medication routinely.   Past Medical History:  Diagnosis Date  . Hypertension   . 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 Sleep apnea   . Wears glasses     Past Surgical History:  Procedure Laterality Date  . HERNIA REPAIR    . NASAL SEPTOPLASTY W/ TURBINOPLASTY  02/13/2016   NASAL SEPTOPLASTY WITH TURBINATE REDUCTION (Bilateral)  . NASAL SEPTOPLASTY W/ TURBINOPLASTY Bilateral 02/13/2016   Procedure: NASAL SEPTOPLASTY WITH TURBINATE REDUCTION;  Surgeon: Christia Readingwight Bates, MD;  [facility];  Service: ENT;  Laterality: Bilateral;    Current Outpatient Prescriptions  Medication Sig Dispense Refill  . telmisartan-hydrochlorothiazide (MICARDIS HCT) 80-12.5 MG tablet Take 1 tablet by mouth daily.     No current facility-administered medications for this visit.     Allergies as of 05/05/2016  . (No Known Allergies)    Family History  Problem Relation Age of Onset  . Cancer Mother   . Colon cancer Neg Hx   . Colon polyps Neg Hx     Social History   Social History  . Marital status: Married    Spouse name: N/A  . Number of children: N/A  . Years of education: N/A   Occupational History  . Not on file.     Social History Main Topics  . Smoking status: Never Smoker  . Smokeless tobacco: Never Used  . Alcohol use Yes     Comment: social beer drinker on the weekends (drinks 2 beers on Friday and 2 beers on Saturday)   . Drug use: No  . Sexual activity: Not on file   Other Topics Concern  . Not on file   Social History Narrative  . No narrative on file    Review of Systems: Gen: Denies any fever, chills, fatigue, weight loss, lack of appetite.  CV: Denies chest pain, heart palpitations, peripheral edema, syncope.  Resp: Denies shortness of breath at rest or with exertion. Denies wheezing or cough.  GI: see HPI  GU : Denies urinary burning, urinary frequency, urinary hesitancy MS: Denies joint pain, muscle weakness, cramps, or limitation of movement.  Derm: Denies rash, itching, dry skin Psych: Denies depression, anxiety, memory loss, and confusion Heme: Denies bruising, bleeding, and enlarged lymph nodes.  Physical Exam: BP (!) 159/91   Pulse 67   Temp 98.3 F (36.8 C) (Oral)   Ht 5\' 11"  (1.803 m)   Wt (!) 308 lb 9.6 oz (140 kg)   BMI 43.04 kg/m  General:   Alert and oriented. Pleasant and cooperative. Well-nourished and well-developed.  Head:  Normocephalic and atraumatic. Eyes:  Without icterus, sclera clear  and conjunctiva pink.  Ears:  Normal auditory acuity. Nose:  No deformity, discharge,  or lesions. Mouth:  No deformity or lesions, oral mucosa pink.  Lungs:  Clear to auscultation bilaterally. No wheezes, rales, or rhonchi. No distress.  Heart:  S1, S2 present without murmurs appreciated.  Abdomen:  +BS, soft, non-tender and non-distended. No HSM noted. No guarding or rebound. No masses appreciated.  Rectal:  Deferred  Msk:  Symmetrical without gross deformities. Normal posture. Extremities:  Without edema. Neurologic:  Alert and  oriented x4;  grossly normal neurologically. Psych:  Alert and cooperative. Normal mood and affect.

## 2016-05-05 NOTE — Progress Notes (Signed)
cc'ed to pcp °

## 2016-05-19 ENCOUNTER — Encounter (HOSPITAL_COMMUNITY)
Admission: RE | Disposition: A | Discharge status: Home / Self Care | Payer: Self-pay | Source: Ambulatory Visit | Attending: Internal Medicine

## 2016-05-19 ENCOUNTER — Ambulatory Visit (HOSPITAL_COMMUNITY)
Admission: RE | Admit: 2016-05-19 | Discharge: 2016-05-19 | Disposition: A | Discharge status: Home / Self Care | Payer: BLUE CROSS/BLUE SHIELD | Source: Ambulatory Visit | Attending: Internal Medicine | Admitting: Internal Medicine

## 2016-05-19 ENCOUNTER — Encounter (HOSPITAL_COMMUNITY): Payer: Self-pay | Admitting: *Deleted

## 2016-05-19 DIAGNOSIS — K5730 Diverticulosis of large intestine without perforation or abscess without bleeding: Secondary | ICD-10-CM | POA: Insufficient documentation

## 2016-05-19 DIAGNOSIS — I10 Essential (primary) hypertension: Secondary | ICD-10-CM | POA: Insufficient documentation

## 2016-05-19 DIAGNOSIS — G4730 Sleep apnea, unspecified: Secondary | ICD-10-CM | POA: Diagnosis not present

## 2016-05-19 DIAGNOSIS — Z1212 Encounter for screening for malignant neoplasm of rectum: Secondary | ICD-10-CM | POA: Diagnosis not present

## 2016-05-19 DIAGNOSIS — Z1211 Encounter for screening for malignant neoplasm of colon: Secondary | ICD-10-CM | POA: Diagnosis not present

## 2016-05-19 DIAGNOSIS — Z79899 Other long term (current) drug therapy: Secondary | ICD-10-CM | POA: Insufficient documentation

## 2016-05-19 HISTORY — PX: COLONOSCOPY: SHX5424

## 2016-05-19 SURGERY — COLONOSCOPY
Anesthesia: Moderate Sedation

## 2016-05-19 MED ORDER — SODIUM CHLORIDE 0.9% FLUSH
INTRAVENOUS | Status: AC
  Filled 2016-05-19: qty 10

## 2016-05-19 MED ORDER — MEPERIDINE HCL 100 MG/ML IJ SOLN
INTRAMUSCULAR | Status: DC | PRN
  Administered 2016-05-19: 50 mg via INTRAVENOUS

## 2016-05-19 MED ORDER — PROMETHAZINE HCL 25 MG/ML IJ SOLN
25.0000 mg | Freq: Once | INTRAMUSCULAR | Status: AC
  Administered 2016-05-19: 25 mg via INTRAVENOUS

## 2016-05-19 MED ORDER — PROMETHAZINE HCL 25 MG/ML IJ SOLN
INTRAMUSCULAR | Status: AC
  Filled 2016-05-19: qty 1

## 2016-05-19 MED ORDER — SODIUM CHLORIDE 0.9 % IV SOLN
INTRAVENOUS | Status: DC
  Administered 2016-05-19: 09:00:00 via INTRAVENOUS

## 2016-05-19 MED ORDER — MEPERIDINE HCL 100 MG/ML IJ SOLN
INTRAMUSCULAR | Status: AC
  Filled 2016-05-19: qty 2

## 2016-05-19 MED ORDER — MIDAZOLAM HCL 5 MG/5ML IJ SOLN
INTRAMUSCULAR | Status: AC
  Filled 2016-05-19: qty 10

## 2016-05-19 MED ORDER — MIDAZOLAM HCL 5 MG/5ML IJ SOLN
INTRAMUSCULAR | Status: DC | PRN
  Administered 2016-05-19: 1 mg via INTRAVENOUS
  Administered 2016-05-19: 2 mg via INTRAVENOUS

## 2016-05-19 MED ORDER — ONDANSETRON HCL 4 MG/2ML IJ SOLN
INTRAMUSCULAR | Status: AC
  Filled 2016-05-19: qty 2

## 2016-05-19 MED ORDER — ONDANSETRON HCL 4 MG/2ML IJ SOLN
INTRAMUSCULAR | Status: DC | PRN
  Administered 2016-05-19: 4 mg via INTRAVENOUS

## 2016-05-19 NOTE — H&P (View-Only) (Signed)
Primary Care Physician:  Dwana MelenaZack Hall, MD Primary Gastroenterologist:  Dr. Jena Gaussourk   Chief Complaint  Patient presents with  . Colonoscopy    no problems    HPI:   Kenneth Bridges is a 50 y.o. male presenting today at the request of Dr. Margo AyeHall for a colonoscopy. He was brought in for an office visit prior to colonoscopy to assess sedation requirements.   No rectal bleeding. No constipation or diarrhea. No abdominal pain. No nausea or vomiting. No reflux issues. No dysphagia. No prior colonoscopy. No FH of colon cancer. No FH of polyps. He drinks 2 beers on Friday and Saturday night on the weekends. There was a question of narcotic use, but this was only a short time several months ago for a surgery that he had. He does not take any pain medication routinely.   Past Medical History:  Diagnosis Date  . Hypertension   . 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 Sleep apnea   . Wears glasses     Past Surgical History:  Procedure Laterality Date  . HERNIA REPAIR    . NASAL SEPTOPLASTY W/ TURBINOPLASTY  02/13/2016   NASAL SEPTOPLASTY WITH TURBINATE REDUCTION (Bilateral)  . NASAL SEPTOPLASTY W/ TURBINOPLASTY Bilateral 02/13/2016   Procedure: NASAL SEPTOPLASTY WITH TURBINATE REDUCTION;  Surgeon: Christia Readingwight Bates, MD;  [facility];  Service: ENT;  Laterality: Bilateral;    Current Outpatient Prescriptions  Medication Sig Dispense Refill  . telmisartan-hydrochlorothiazide (MICARDIS HCT) 80-12.5 MG tablet Take 1 tablet by mouth daily.     No current facility-administered medications for this visit.     Allergies as of 05/05/2016  . (No Known Allergies)    Family History  Problem Relation Age of Onset  . Cancer Mother   . Colon cancer Neg Hx   . Colon polyps Neg Hx     Social History   Social History  . Marital status: Married    Spouse name: N/A  . Number of children: N/A  . Years of education: N/A   Occupational History  . Not on file.     Social History Main Topics  . Smoking status: Never Smoker  . Smokeless tobacco: Never Used  . Alcohol use Yes     Comment: social beer drinker on the weekends (drinks 2 beers on Friday and 2 beers on Saturday)   . Drug use: No  . Sexual activity: Not on file   Other Topics Concern  . Not on file   Social History Narrative  . No narrative on file    Review of Systems: Gen: Denies any fever, chills, fatigue, weight loss, lack of appetite.  CV: Denies chest pain, heart palpitations, peripheral edema, syncope.  Resp: Denies shortness of breath at rest or with exertion. Denies wheezing or cough.  GI: see HPI  GU : Denies urinary burning, urinary frequency, urinary hesitancy MS: Denies joint pain, muscle weakness, cramps, or limitation of movement.  Derm: Denies rash, itching, dry skin Psych: Denies depression, anxiety, memory loss, and confusion Heme: Denies bruising, bleeding, and enlarged lymph nodes.  Physical Exam: BP (!) 159/91   Pulse 67   Temp 98.3 F (36.8 C) (Oral)   Ht 5\' 11"  (1.803 m)   Wt (!) 308 lb 9.6 oz (140 kg)   BMI 43.04 kg/m  General:   Alert and oriented. Pleasant and cooperative. Well-nourished and well-developed.  Head:  Normocephalic and atraumatic. Eyes:  Without icterus, sclera clear  and conjunctiva pink.  Ears:  Normal auditory acuity. Nose:  No deformity, discharge,  or lesions. Mouth:  No deformity or lesions, oral mucosa pink.  Lungs:  Clear to auscultation bilaterally. No wheezes, rales, or rhonchi. No distress.  Heart:  S1, S2 present without murmurs appreciated.  Abdomen:  +BS, soft, non-tender and non-distended. No HSM noted. No guarding or rebound. No masses appreciated.  Rectal:  Deferred  Msk:  Symmetrical without gross deformities. Normal posture. Extremities:  Without edema. Neurologic:  Alert and  oriented x4;  grossly normal neurologically. Psych:  Alert and cooperative. Normal mood and affect.

## 2016-05-19 NOTE — Op Note (Signed)
[facility] Patient Name: Kenneth Bridges Procedure Date: 05/19/2016 9:29 AM MRN: [id number] Date of Birth: February 02, 1966 Attending MD: Gennette Pac , MD CSN: [id number] Age: 50 Admit Type: Outpatient Procedure:                Colonoscopy Indications:              Screening for colorectal malignant neoplasm Providers:                Gennette Pac, MD, Nena Polio, RN, Birder Robson, Technician Referring MD:              Medicines:                Midazolam 3 mg IV, Meperidine 50 mg IV,                            Promethazine 25 mg IV Complications:            No immediate complications. Estimated Blood Loss:     Estimated blood loss: none. Procedure:                Pre-Anesthesia Assessment:                           - Prior to the procedure, a History and Physical                            was performed, and patient medications and                            allergies were reviewed. The patient's tolerance of                            previous anesthesia was also reviewed. The risks                            and benefits of the procedure and the sedation                            options and risks were discussed with the patient.                            All questions were answered, and informed consent                            was obtained. Prior Anticoagulants: The patient has                            taken no previous anticoagulant or antiplatelet                            agents. ASA Grade Assessment: II - A patient with  mild systemic disease. After reviewing the risks                            and benefits, the patient was deemed in                            satisfactory condition to undergo the procedure.                           After obtaining informed consent, the colonoscope                            was passed under direct vision. Throughout the                            procedure,  the patient's blood pressure, pulse, and                            oxygen saturations were monitored continuously. The                            EC-[id number] ([id number] scope was introduced through                            the anus and advanced to the the cecum, identified                            by appendiceal orifice and ileocecal valve. The                            colonoscopy was performed without difficulty. The                            patient tolerated the procedure well. The quality                            of the bowel preparation was adequate. The entire                            colon was well visualized. The ileocecal valve,                            appendiceal orifice, and rectum were photographed. Scope In: 9:39:46 AM Scope Out: 9:52:29 AM Scope Withdrawal Time: 0 hours 8 minutes 12 seconds  Total Procedure Duration: 0 hours 12 minutes 43 seconds  Findings:      The perianal and digital rectal examinations were normal.      Scattered small and large-mouthed diverticula were found in the sigmoid       colon and descending colon.      The exam was otherwise without abnormality on direct and retroflexion       views. Impression:               - Diverticulosis in the sigmoid colon and in the  descending colon.                           - The examination was otherwise normal on direct                            and retroflexion views.                           - No specimens collected. Moderate Sedation:      Moderate (conscious) sedation was administered by the endoscopy nurse       and supervised by the endoscopist. The following parameters were       monitored: oxygen saturation, heart rate, blood pressure, respiratory       rate, EKG, adequacy of pulmonary ventilation, and response to care.       Total physician intraservice time was 18 minutes. Recommendation:           - Patient has a contact number available for                             emergencies. The signs and symptoms of potential                            delayed complications were discussed with the                            patient. Return to normal activities tomorrow.                            Written discharge instructions were provided to the                            patient.                           - Resume previous diet.                           - Continue present medications.                           - Repeat colonoscopy in 10 years for screening                            purposes.                           - Return to GI clinic PRN. Procedure Code(s):        --- Professional ---                           [id number], Colonoscopy, flexible; diagnostic, including                            collection of specimen(s) by brushing or washing,  when performed (separate procedure)                           99152, Moderate sedation services provided by the                            same physician or other qualified health care                            professional performing the diagnostic or                            therapeutic service that the sedation supports,                            requiring the presence of an independent trained                            observer to assist in the monitoring of the                            patient's level of consciousness and physiological                            status; initial 15 minutes of intraservice time,                            patient age 29 years or older Diagnosis Code(s):        --- Professional ---                           Z12.11, Encounter for screening for malignant                            neoplasm of colon                           K57.30, Diverticulosis of large intestine without                            perforation or abscess without bleeding CPT copyright 2016 American Medical Association. All rights reserved. The codes documented in this report are  preliminary and upon coder review may  be revised to meet current compliance requirements. Gerrit Friends. Rosanne Wohlfarth, MD Gennette Pac, MD 05/19/2016 10:00:31 AM This report has been signed electronically. Number of Addenda: 0

## 2016-05-19 NOTE — Discharge Instructions (Signed)
Colonoscopy Discharge Instructions  Read the instructions outlined below and refer to this sheet in the next few weeks. These discharge instructions provide you with general information on caring for yourself after you leave the hospital. Your doctor may also give you specific instructions. While your treatment has been planned according to the most current medical practices available, unavoidable complications occasionally occur. If you have any problems or questions after discharge, call Dr. Jena Gaussourk at [phone]. ACTIVITY  You may resume your regular activity, but move at a slower pace for the next 24 hours.   Take frequent rest periods for the next 24 hours.   Walking will help get rid of the air and reduce the bloated feeling in your belly (abdomen).   No driving for 24 hours (because of the medicine (anesthesia) used during the test).    Do not sign any important legal documents or operate any machinery for 24 hours (because of the anesthesia used during the test).  NUTRITION  Drink plenty of fluids.   You may resume your normal diet as instructed by your doctor.   Begin with a light meal and progress to your normal diet. Heavy or fried foods are harder to digest and may make you feel sick to your stomach (nauseated).   Avoid alcoholic beverages for 24 hours or as instructed.  MEDICATIONS  You may resume your normal medications unless your doctor tells you otherwise.  WHAT YOU CAN EXPECT TODAY  Some feelings of bloating in the abdomen.   Passage of more gas than usual.   Spotting of blood in your stool or on the toilet paper.  IF YOU HAD POLYPS REMOVED DURING THE COLONOSCOPY:  No aspirin products for 7 days or as instructed.   No alcohol for 7 days or as instructed.   Eat a soft diet for the next 24 hours.  FINDING OUT THE RESULTS OF YOUR TEST Not all test results are available during your visit. If your test results are not back during the visit, make an appointment  with your caregiver to find out the results. Do not assume everything is normal if you have not heard from your caregiver or the medical facility. It is important for you to follow up on all of your test results.  SEEK IMMEDIATE MEDICAL ATTENTION IF:  You have more than a spotting of blood in your stool.   Your belly is swollen (abdominal distention).   You are nauseated or vomiting.   You have a temperature over 101.   You have abdominal pain or discomfort that is severe or gets worse throughout the day.    Diverticulosis information provided  Repeat colonoscopy for screening purposes in 10 years    Diverticulosis Diverticulosis is the condition that develops when small pouches (diverticula) form in the wall of your colon. Your colon, or large intestine, is where water is absorbed and stool is formed. The pouches form when the inside layer of your colon pushes through weak spots in the outer layers of your colon. CAUSES  No one knows exactly what causes diverticulosis. RISK FACTORS  Being older than 50. Your risk for this condition increases with age. Diverticulosis is rare in people younger than 40 years. By age 52, almost everyone has it.  Eating a low-fiber diet.  Being frequently constipated.  Being overweight.  Not getting enough exercise.  Smoking.  Taking over-the-counter pain medicines, like aspirin and ibuprofen. SYMPTOMS  Most people with diverticulosis do not have symptoms. DIAGNOSIS  Because diverticulosis  often has no symptoms, health care providers often discover the condition during an exam for other colon problems. In many cases, a health care provider will diagnose diverticulosis while using a flexible scope to examine the colon (colonoscopy). TREATMENT  If you have never developed an infection related to diverticulosis, you may not need treatment. If you have had an infection before, treatment may include:  Eating more fruits, vegetables, and  grains.  Taking a fiber supplement.  Taking a live bacteria supplement (probiotic).  Taking medicine to relax your colon. HOME CARE INSTRUCTIONS   Drink at least 6-8 glasses of water each day to prevent constipation.  Try not to strain when you have a bowel movement.  Keep all follow-up appointments. If you have had an infection before:  Increase the fiber in your diet as directed by your health care provider or dietitian.  Take a dietary fiber supplement if your health care provider approves.  Only take medicines as directed by your health care provider. SEEK MEDICAL CARE IF:   You have abdominal pain.  You have bloating.  You have cramps.  You have not gone to the bathroom in 3 days. SEEK IMMEDIATE MEDICAL CARE IF:   Your pain gets worse.  Yourbloating becomes very bad.  You have a fever or chills, and your symptoms suddenly get worse.  You begin vomiting.  You have bowel movements that are bloody or black. MAKE SURE YOU:  Understand these instructions.  Will watch your condition.  Will get help right away if you are not doing well or get worse.   This information is not intended to replace advice given to you by your health care provider. Make sure you discuss any questions you have with your health care provider.   Document Released: 04/22/2004 Document Revised: 07/31/2013 Document Reviewed: 06/20/2013 Elsevier Interactive Patient Education [employer].

## 2016-05-19 NOTE — Interval H&P Note (Signed)
History and Physical Interval Note:  05/19/2016 9:27 AM  Kenneth Bridges  has presented today for surgery, with the diagnosis of screening  The various methods of treatment have been discussed with the patient and family. After consideration of risks, benefits and other options for treatment, the patient has consented to  Procedure(s) with comments: COLONOSCOPY (N/A) - 9:30 am as a surgical intervention .  The patient's history has been reviewed, patient examined, no change in status, stable for surgery.  I have reviewed the patient's chart and labs.  Questions were answered to the patient's satisfaction.     No change.   The risks, benefits, limitations, alternatives and imponderables have been reviewed with the patient. Questions have been answered. All parties are agreeable.   Eula Listenobert Laurna Shetley

## 2016-05-22 ENCOUNTER — Emergency Department (HOSPITAL_COMMUNITY): Payer: BLUE CROSS/BLUE SHIELD

## 2016-05-22 ENCOUNTER — Encounter (HOSPITAL_COMMUNITY): Payer: Self-pay | Admitting: *Deleted

## 2016-05-22 ENCOUNTER — Emergency Department (HOSPITAL_COMMUNITY)
Admission: EM | Admit: 2016-05-22 | Discharge: 2016-05-22 | Disposition: A | Discharge status: Home / Self Care | Payer: BLUE CROSS/BLUE SHIELD | Attending: Emergency Medicine | Admitting: Emergency Medicine

## 2016-05-22 DIAGNOSIS — Z79899 Other long term (current) drug therapy: Secondary | ICD-10-CM | POA: Diagnosis not present

## 2016-05-22 DIAGNOSIS — R42 Dizziness and giddiness: Secondary | ICD-10-CM | POA: Diagnosis present

## 2016-05-22 DIAGNOSIS — I10 Essential (primary) hypertension: Secondary | ICD-10-CM | POA: Insufficient documentation

## 2016-05-22 DIAGNOSIS — H81399 Other peripheral vertigo, unspecified ear: Secondary | ICD-10-CM

## 2016-05-22 DIAGNOSIS — R51 Headache: Secondary | ICD-10-CM | POA: Diagnosis not present

## 2016-05-22 LAB — COMPREHENSIVE METABOLIC PANEL
ALBUMIN: 4 g/dL (ref 3.5–5.0)
ALT: 38 U/L (ref 17–63)
ANION GAP: 6 (ref 5–15)
AST: 27 U/L (ref 15–41)
Alkaline Phosphatase: 49 U/L (ref 38–126)
BILIRUBIN TOTAL: 0.8 mg/dL (ref 0.3–1.2)
BUN: 15 mg/dL (ref 6–20)
CHLORIDE: 105 mmol/L (ref 101–111)
CO2: 26 mmol/L (ref 22–32)
Calcium: 9 mg/dL (ref 8.9–10.3)
Creatinine, Ser: 0.88 mg/dL (ref 0.61–1.24)
Glucose, Bld: 134 mg/dL — ABNORMAL HIGH (ref 65–99)
POTASSIUM: 3.8 mmol/L (ref 3.5–5.1)
SODIUM: 137 mmol/L (ref 135–145)
TOTAL PROTEIN: 7.2 g/dL (ref 6.5–8.1)

## 2016-05-22 LAB — CBC WITH DIFFERENTIAL/PLATELET
BASOS ABS: 0 10*3/uL (ref 0.0–0.1)
Basophils Relative: 0 %
EOS PCT: 1 %
Eosinophils Absolute: 0.1 10*3/uL (ref 0.0–0.7)
HCT: 40.5 % (ref 39.0–52.0)
Hemoglobin: 14.6 g/dL (ref 13.0–17.0)
LYMPHS PCT: 16 %
Lymphs Abs: 0.9 10*3/uL (ref 0.7–4.0)
MCH: 31.1 pg (ref 26.0–34.0)
MCHC: 36 g/dL (ref 30.0–36.0)
MCV: 86.2 fL (ref 78.0–100.0)
Monocytes Absolute: 0.4 10*3/uL (ref 0.1–1.0)
Monocytes Relative: 7 %
Neutro Abs: 4.5 10*3/uL (ref 1.7–7.7)
Neutrophils Relative %: 76 %
PLATELETS: 175 10*3/uL (ref 150–400)
RBC: 4.7 MIL/uL (ref 4.22–5.81)
RDW: 12.4 % (ref 11.5–15.5)
WBC: 5.9 10*3/uL (ref 4.0–10.5)

## 2016-05-22 LAB — URINALYSIS, ROUTINE W REFLEX MICROSCOPIC
Bilirubin Urine: NEGATIVE
Glucose, UA: NEGATIVE mg/dL
Hgb urine dipstick: NEGATIVE
Ketones, ur: 40 mg/dL — AB
Leukocytes, UA: NEGATIVE
Nitrite: NEGATIVE
Protein, ur: NEGATIVE mg/dL
Specific Gravity, Urine: 1.01 (ref 1.005–1.030)
pH: 7 (ref 5.0–8.0)

## 2016-05-22 LAB — TROPONIN I

## 2016-05-22 MED ORDER — MECLIZINE HCL 12.5 MG PO TABS
25.0000 mg | ORAL_TABLET | Freq: Once | ORAL | Status: AC
  Administered 2016-05-22: 25 mg via ORAL
  Filled 2016-05-22: qty 2

## 2016-05-22 MED ORDER — ONDANSETRON 4 MG PO TBDP: 4.0000 mg | ORAL_TABLET | Freq: Three times a day (TID) | ORAL | 0 refills | Status: DC | PRN

## 2016-05-22 MED ORDER — MECLIZINE HCL 25 MG PO TABS: 25.0000 mg | ORAL_TABLET | Freq: Three times a day (TID) | ORAL | 0 refills | Status: DC | PRN

## 2016-05-22 MED ORDER — SODIUM CHLORIDE 0.9 % IV SOLN
1000.0000 mL | Freq: Once | INTRAVENOUS | Status: AC
  Administered 2016-05-22: 1000 mL via INTRAVENOUS

## 2016-05-22 MED ORDER — ONDANSETRON HCL 4 MG/2ML IJ SOLN
4.0000 mg | Freq: Once | INTRAMUSCULAR | Status: AC
  Administered 2016-05-22: 4 mg via INTRAVENOUS
  Filled 2016-05-22: qty 2

## 2016-05-22 NOTE — ED Notes (Signed)
Pt had an episode of dizziness and eye twitching. Family made this nurse aware. When nurse went to evaluate patient, he no longer had these symptoms. Kenneth MaskerKaren Sofia made aware.

## 2016-05-22 NOTE — ED Notes (Signed)
MD at bedside. 

## 2016-05-22 NOTE — Discharge Instructions (Signed)
See your Physician for recheck on Monday °

## 2016-05-22 NOTE — ED Triage Notes (Signed)
States he was vomiting and had blurred vision prior to arrival, admits to falling at home home from dizziness. Denies any head injury, also c/o urinary frequency

## 2016-05-23 NOTE — ED Provider Notes (Signed)
[facility] Provider Note   CSN: [id number] Arrival date & time: 05/22/16  4098     History   Chief Complaint Chief Complaint  Patient presents with  . Dizziness  Pt reports he awoke at 5:30 with a headache.  Pt reports getting up around 6:00 and feeling dizzy.  Pt reports he was naseated and vomitted.  Pt reports he had episodes of eyes twitching and double vision.  Pt reports it happened 3 times. Each time resolved spontaneously.   HPI Kenneth Bridges is a 50 y.o. male.  HPI  Past Medical History:  Diagnosis Date  . Hypertension   . 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 Sleep apnea   . Wears glasses     Patient Active Problem List   Diagnosis Date Noted  . Encounter for screening colonoscopy 05/05/2016  . Nasal septal deviation 02/13/2016    Past Surgical History:  Procedure Laterality Date  . HERNIA REPAIR    . NASAL SEPTOPLASTY W/ TURBINOPLASTY  02/13/2016   NASAL SEPTOPLASTY WITH TURBINATE REDUCTION (Bilateral)  . NASAL SEPTOPLASTY W/ TURBINOPLASTY Bilateral 02/13/2016   Procedure: NASAL SEPTOPLASTY WITH TURBINATE REDUCTION;  Surgeon: Christia Reading, MD;  Location: [facility];  Service: ENT;  Laterality: Bilateral;       Home Medications    Prior to Admission medications   Medication Sig Start Date End Date Taking? Authorizing Provider  telmisartan-hydrochlorothiazide (MICARDIS HCT) 80-12.5 MG tablet Take 1 tablet by mouth daily.   Yes Historical Provider, MD  meclizine (ANTIVERT) 25 MG tablet Take 1 tablet (25 mg total) by mouth 3 (three) times daily as needed for dizziness. 05/22/16   Elson Areas, PA-C  ondansetron (ZOFRAN ODT) 4 MG disintegrating tablet Take 1 tablet (4 mg total) by mouth every 8 (eight) hours as needed for nausea or vomiting. 05/22/16   Elson Areas, PA-C  polyethylene glycol-electrolytes (TRILYTE) 420 g solution Take 4,000 mLs by mouth as directed. Patient not taking: Reported on 05/22/2016  05/05/16   Corbin Ade, MD    Family History Family History  Problem Relation Age of Onset  . Cancer Mother   . Colon cancer Neg Hx   . Colon polyps Neg Hx     Social History Social History  Substance Use Topics  . Smoking status: Never Smoker  . Smokeless tobacco: Never Used  . Alcohol use Yes     Comment: social beer drinker on the weekends (drinks 2 beers on Friday and 2 beers on Saturday)      Allergies   Review of patient's allergies indicates no known allergies.   Review of Systems Review of Systems  All other systems reviewed and are negative.    Physical Exam Updated Vital Signs BP 160/93 (BP Location: Left Arm)   Pulse 66   Temp 98.1 F (36.7 C) (Oral)   Resp 18   Ht 5\' 11"  (1.803 m)   Wt (!) 138.3 kg   SpO2 96%   BMI 42.54 kg/m   Physical Exam  Constitutional: He appears well-developed and well-nourished.  HENT:  Head: Normocephalic and atraumatic.  Right Ear: External ear normal.  Left Ear: External ear normal.  Nose: Nose normal.  Mouth/Throat: Oropharynx is clear and moist.  Eyes: Conjunctivae are normal. Right eye exhibits no discharge. Left eye exhibits no discharge.  Slight nystagmus right  Neck: Neck supple.  Cardiovascular: Normal rate and regular rhythm.   No murmur heard. Pulmonary/Chest: Effort normal and breath  sounds normal. No respiratory distress.  Abdominal: Soft. There is no tenderness.  Musculoskeletal: He exhibits no edema.  Neurological: He is alert.  Skin: Skin is warm and dry.  Psychiatric: He has a normal mood and affect.  Nursing note and vitals reviewed.  Rn reports pt had an episode of twitching.  She describes nystagmus.    ED Treatments / Results  Labs (all labs ordered are listed, but only abnormal results are displayed) Labs Reviewed  COMPREHENSIVE METABOLIC PANEL - Abnormal; Notable for the following:       Result Value   Glucose, Bld 134 (*)    All other components within normal limits  URINALYSIS,  ROUTINE W REFLEX MICROSCOPIC (NOT AT [facility]) - Abnormal; Notable for the following:    Ketones, ur 40 (*)    All other components within normal limits  CBC WITH DIFFERENTIAL/PLATELET  TROPONIN I    EKG  EKG Interpretation None       Radiology Ct Head Wo Contrast  Result Date: 05/22/2016 CLINICAL DATA:  Two episodes of double vision and since waking with a headache at 5 a.m. EXAM: CT HEAD WITHOUT CONTRAST TECHNIQUE: Contiguous axial images were obtained from the base of the skull through the vertex without intravenous contrast. COMPARISON:  None. FINDINGS: Brain: No evidence of acute infarction, hemorrhage, hydrocephalus, extra-axial collection or mass lesion/mass effect. Vascular: No hyperdense vessel or unexpected calcification. Skull: No osseous abnormality. Sinuses/Orbits: Visualized paranasal sinuses are clear. Visualized mastoid sinuses are clear. Visualized orbits demonstrate no focal abnormality. Other: None IMPRESSION: No acute intracranial pathology. Electronically Signed   By: Elige KoHetal  Patel   On: 05/22/2016 10:45    Procedures Procedures (including critical care time)  Medications Ordered in ED Medications  0.9 %  sodium chloride infusion (0 mLs Intravenous Stopped 05/22/16 1305)  0.9 %  sodium chloride infusion (0 mLs Intravenous Stopped 05/22/16 1112)  ondansetron (ZOFRAN) injection 4 mg (4 mg Intravenous Given 05/22/16 1110)  meclizine (ANTIVERT) tablet 25 mg (25 mg Oral Given 05/22/16 1141)     Initial Impression / Assessment and Plan / ED Course  I have reviewed the triage vital signs and the nursing notes.  Pertinent labs & imaging results that were available during my care of the patient were reviewed by me and considered in my medical decision making (see chart for details).  Clinical Course    Pt had a colonoscopy on Thursday.  He has ketones in his urine.  Pt given IV fluids zofran and antivert.   Pt reports he feels much better.    Final Clinical  Impressions(s) / ED Diagnoses   Final diagnoses:  Peripheral vertigo, unspecified laterality    New Prescriptions Discharge Medication List as of 05/22/2016 12:48 PM    START taking these medications   Details  meclizine (ANTIVERT) 25 MG tablet Take 1 tablet (25 mg total) by mouth 3 (three) times daily as needed for dizziness., Starting Sat 05/22/2016, Print    ondansetron (ZOFRAN ODT) 4 MG disintegrating tablet Take 1 tablet (4 mg total) by mouth every 8 (eight) hours as needed for nausea or vomiting., Starting Sat 05/22/2016, Print      An After Visit Summary was printed and given to the patient.   Lonia SkinnerLeslie K Underhill FlatsSofia, PA-C 05/23/16 [id number]    Bethann BerkshireJoseph Zammit, MD 05/26/16 1226

## 2016-05-24 DIAGNOSIS — G4733 Obstructive sleep apnea (adult) (pediatric): Secondary | ICD-10-CM | POA: Diagnosis not present

## 2016-05-25 ENCOUNTER — Encounter (HOSPITAL_COMMUNITY): Payer: Self-pay | Admitting: Internal Medicine

## 2016-05-25 DIAGNOSIS — R42 Dizziness and giddiness: Secondary | ICD-10-CM | POA: Diagnosis not present

## 2016-05-28 DIAGNOSIS — Z23 Encounter for immunization: Secondary | ICD-10-CM | POA: Diagnosis not present

## 2016-06-22 DIAGNOSIS — E291 Testicular hypofunction: Secondary | ICD-10-CM | POA: Diagnosis not present

## 2016-06-22 DIAGNOSIS — Z1159 Encounter for screening for other viral diseases: Secondary | ICD-10-CM | POA: Diagnosis not present

## 2016-06-22 DIAGNOSIS — R7301 Impaired fasting glucose: Secondary | ICD-10-CM | POA: Diagnosis not present

## 2016-06-22 DIAGNOSIS — I10 Essential (primary) hypertension: Secondary | ICD-10-CM | POA: Diagnosis not present

## 2016-06-23 DIAGNOSIS — I10 Essential (primary) hypertension: Secondary | ICD-10-CM | POA: Diagnosis not present

## 2016-06-23 DIAGNOSIS — G4733 Obstructive sleep apnea (adult) (pediatric): Secondary | ICD-10-CM | POA: Diagnosis not present

## 2016-06-23 DIAGNOSIS — E782 Mixed hyperlipidemia: Secondary | ICD-10-CM | POA: Diagnosis not present

## 2016-06-23 DIAGNOSIS — R7301 Impaired fasting glucose: Secondary | ICD-10-CM | POA: Diagnosis not present

## 2016-08-19 DIAGNOSIS — J206 Acute bronchitis due to rhinovirus: Secondary | ICD-10-CM | POA: Diagnosis not present

## 2016-12-18 DIAGNOSIS — S30861A Insect bite (nonvenomous) of abdominal wall, initial encounter: Secondary | ICD-10-CM | POA: Diagnosis not present

## 2016-12-18 DIAGNOSIS — R0981 Nasal congestion: Secondary | ICD-10-CM | POA: Diagnosis not present

## 2016-12-18 DIAGNOSIS — R509 Fever, unspecified: Secondary | ICD-10-CM | POA: Diagnosis not present

## 2017-04-14 DIAGNOSIS — L237 Allergic contact dermatitis due to plants, except food: Secondary | ICD-10-CM | POA: Diagnosis not present

## 2017-04-14 DIAGNOSIS — Z6841 Body Mass Index (BMI) 40.0 and over, adult: Secondary | ICD-10-CM | POA: Diagnosis not present

## 2017-05-30 DIAGNOSIS — Z23 Encounter for immunization: Secondary | ICD-10-CM | POA: Diagnosis not present

## 2017-10-17 ENCOUNTER — Other Ambulatory Visit (HOSPITAL_COMMUNITY): Payer: Self-pay | Admitting: Internal Medicine

## 2017-10-17 DIAGNOSIS — S0093XA Contusion of unspecified part of head, initial encounter: Secondary | ICD-10-CM | POA: Diagnosis not present

## 2017-10-17 DIAGNOSIS — R600 Localized edema: Secondary | ICD-10-CM

## 2017-10-17 DIAGNOSIS — G44329 Chronic post-traumatic headache, not intractable: Secondary | ICD-10-CM

## 2017-10-18 ENCOUNTER — Ambulatory Visit (HOSPITAL_COMMUNITY)
Admission: RE | Admit: 2017-10-18 | Discharge: 2017-10-18 | Disposition: A | Discharge status: Home / Self Care | Payer: BLUE CROSS/BLUE SHIELD | Source: Ambulatory Visit | Attending: Internal Medicine | Admitting: Internal Medicine

## 2017-10-18 DIAGNOSIS — R600 Localized edema: Secondary | ICD-10-CM | POA: Diagnosis not present

## 2017-10-18 DIAGNOSIS — G44329 Chronic post-traumatic headache, not intractable: Secondary | ICD-10-CM | POA: Insufficient documentation

## 2017-10-18 DIAGNOSIS — S0003XA Contusion of scalp, initial encounter: Secondary | ICD-10-CM | POA: Diagnosis not present

## 2017-10-18 MED ORDER — IOPAMIDOL (ISOVUE-300) INJECTION 61%
75.0000 mL | Freq: Once | INTRAVENOUS | Status: AC | PRN
  Administered 2017-10-18: 75 mL via INTRAVENOUS

## 2017-12-09 DIAGNOSIS — S0093XA Contusion of unspecified part of head, initial encounter: Secondary | ICD-10-CM | POA: Diagnosis not present

## 2017-12-09 DIAGNOSIS — R7301 Impaired fasting glucose: Secondary | ICD-10-CM | POA: Diagnosis not present

## 2017-12-09 DIAGNOSIS — E782 Mixed hyperlipidemia: Secondary | ICD-10-CM | POA: Diagnosis not present

## 2017-12-09 DIAGNOSIS — I10 Essential (primary) hypertension: Secondary | ICD-10-CM | POA: Diagnosis not present

## 2017-12-14 DIAGNOSIS — E782 Mixed hyperlipidemia: Secondary | ICD-10-CM | POA: Diagnosis not present

## 2017-12-14 DIAGNOSIS — R7301 Impaired fasting glucose: Secondary | ICD-10-CM | POA: Diagnosis not present

## 2017-12-14 DIAGNOSIS — J060 Acute laryngopharyngitis: Secondary | ICD-10-CM | POA: Diagnosis not present

## 2017-12-14 DIAGNOSIS — I10 Essential (primary) hypertension: Secondary | ICD-10-CM | POA: Diagnosis not present

## 2018-01-10 DIAGNOSIS — G4733 Obstructive sleep apnea (adult) (pediatric): Secondary | ICD-10-CM | POA: Diagnosis not present

## 2018-01-10 DIAGNOSIS — E291 Testicular hypofunction: Secondary | ICD-10-CM | POA: Diagnosis not present

## 2018-01-10 DIAGNOSIS — R7301 Impaired fasting glucose: Secondary | ICD-10-CM | POA: Diagnosis not present

## 2018-01-10 DIAGNOSIS — I10 Essential (primary) hypertension: Secondary | ICD-10-CM | POA: Diagnosis not present

## 2018-01-10 DIAGNOSIS — F5221 Male erectile disorder: Secondary | ICD-10-CM | POA: Diagnosis not present

## 2018-01-10 DIAGNOSIS — J060 Acute laryngopharyngitis: Secondary | ICD-10-CM | POA: Diagnosis not present

## 2018-01-10 DIAGNOSIS — Z6841 Body Mass Index (BMI) 40.0 and over, adult: Secondary | ICD-10-CM | POA: Diagnosis not present

## 2018-01-10 DIAGNOSIS — E6609 Other obesity due to excess calories: Secondary | ICD-10-CM | POA: Diagnosis not present

## 2018-01-10 DIAGNOSIS — E782 Mixed hyperlipidemia: Secondary | ICD-10-CM | POA: Diagnosis not present

## 2018-05-16 DIAGNOSIS — R5383 Other fatigue: Secondary | ICD-10-CM | POA: Diagnosis not present

## 2018-05-16 DIAGNOSIS — N529 Male erectile dysfunction, unspecified: Secondary | ICD-10-CM | POA: Diagnosis not present

## 2018-05-16 DIAGNOSIS — G4730 Sleep apnea, unspecified: Secondary | ICD-10-CM | POA: Diagnosis not present

## 2018-06-07 DIAGNOSIS — Z23 Encounter for immunization: Secondary | ICD-10-CM | POA: Diagnosis not present

## 2018-06-29 DIAGNOSIS — E782 Mixed hyperlipidemia: Secondary | ICD-10-CM | POA: Diagnosis not present

## 2018-06-29 DIAGNOSIS — R7301 Impaired fasting glucose: Secondary | ICD-10-CM | POA: Diagnosis not present

## 2018-06-29 DIAGNOSIS — F5221 Male erectile disorder: Secondary | ICD-10-CM | POA: Diagnosis not present

## 2018-06-29 DIAGNOSIS — J060 Acute laryngopharyngitis: Secondary | ICD-10-CM | POA: Diagnosis not present

## 2018-06-29 DIAGNOSIS — E6609 Other obesity due to excess calories: Secondary | ICD-10-CM | POA: Diagnosis not present

## 2018-06-29 DIAGNOSIS — Z6841 Body Mass Index (BMI) 40.0 and over, adult: Secondary | ICD-10-CM | POA: Diagnosis not present

## 2018-06-29 DIAGNOSIS — I10 Essential (primary) hypertension: Secondary | ICD-10-CM | POA: Diagnosis not present

## 2018-07-04 DIAGNOSIS — R7301 Impaired fasting glucose: Secondary | ICD-10-CM | POA: Diagnosis not present

## 2018-07-04 DIAGNOSIS — I10 Essential (primary) hypertension: Secondary | ICD-10-CM | POA: Diagnosis not present

## 2018-07-04 DIAGNOSIS — G4733 Obstructive sleep apnea (adult) (pediatric): Secondary | ICD-10-CM | POA: Diagnosis not present

## 2018-07-04 DIAGNOSIS — E782 Mixed hyperlipidemia: Secondary | ICD-10-CM | POA: Diagnosis not present

## 2018-11-07 DIAGNOSIS — Z6841 Body Mass Index (BMI) 40.0 and over, adult: Secondary | ICD-10-CM | POA: Diagnosis not present

## 2018-11-07 DIAGNOSIS — R7301 Impaired fasting glucose: Secondary | ICD-10-CM | POA: Diagnosis not present

## 2018-11-07 DIAGNOSIS — E6609 Other obesity due to excess calories: Secondary | ICD-10-CM | POA: Diagnosis not present

## 2018-11-07 DIAGNOSIS — I10 Essential (primary) hypertension: Secondary | ICD-10-CM | POA: Diagnosis not present

## 2018-11-07 DIAGNOSIS — F5221 Male erectile disorder: Secondary | ICD-10-CM | POA: Diagnosis not present

## 2018-11-07 DIAGNOSIS — E782 Mixed hyperlipidemia: Secondary | ICD-10-CM | POA: Diagnosis not present

## 2018-11-07 DIAGNOSIS — N529 Male erectile dysfunction, unspecified: Secondary | ICD-10-CM | POA: Diagnosis not present

## 2018-11-08 DIAGNOSIS — E291 Testicular hypofunction: Secondary | ICD-10-CM | POA: Diagnosis not present

## 2018-11-08 DIAGNOSIS — I10 Essential (primary) hypertension: Secondary | ICD-10-CM | POA: Diagnosis not present

## 2018-11-08 DIAGNOSIS — E1169 Type 2 diabetes mellitus with other specified complication: Secondary | ICD-10-CM | POA: Diagnosis not present

## 2018-11-08 DIAGNOSIS — E782 Mixed hyperlipidemia: Secondary | ICD-10-CM | POA: Diagnosis not present

## 2018-12-06 DIAGNOSIS — R509 Fever, unspecified: Secondary | ICD-10-CM | POA: Diagnosis not present

## 2018-12-06 DIAGNOSIS — W57XXXA Bitten or stung by nonvenomous insect and other nonvenomous arthropods, initial encounter: Secondary | ICD-10-CM | POA: Diagnosis not present

## 2019-03-06 DIAGNOSIS — M722 Plantar fascial fibromatosis: Secondary | ICD-10-CM | POA: Diagnosis not present

## 2019-04-10 DIAGNOSIS — M722 Plantar fascial fibromatosis: Secondary | ICD-10-CM | POA: Diagnosis not present

## 2019-04-10 DIAGNOSIS — M25561 Pain in right knee: Secondary | ICD-10-CM | POA: Diagnosis not present

## 2019-05-31 DIAGNOSIS — K529 Noninfective gastroenteritis and colitis, unspecified: Secondary | ICD-10-CM | POA: Diagnosis not present

## 2019-06-13 DIAGNOSIS — M25561 Pain in right knee: Secondary | ICD-10-CM | POA: Diagnosis not present

## 2019-06-26 DIAGNOSIS — Z23 Encounter for immunization: Secondary | ICD-10-CM | POA: Diagnosis not present

## 2019-07-11 ENCOUNTER — Ambulatory Visit: Payer: BC Managed Care – PPO

## 2019-07-11 ENCOUNTER — Other Ambulatory Visit: Payer: Self-pay

## 2019-07-11 ENCOUNTER — Ambulatory Visit (INDEPENDENT_AMBULATORY_CARE_PROVIDER_SITE_OTHER): Payer: BLUE CROSS/BLUE SHIELD | Admitting: Orthopedic Surgery

## 2019-07-11 ENCOUNTER — Encounter: Payer: Self-pay | Admitting: Orthopedic Surgery

## 2019-07-11 VITALS — BP 176/89 | HR 60 | Temp 98.8°F | Ht 71.0 in | Wt 300.0 lb

## 2019-07-11 DIAGNOSIS — G8929 Other chronic pain: Secondary | ICD-10-CM

## 2019-07-11 DIAGNOSIS — M25561 Pain in right knee: Secondary | ICD-10-CM

## 2019-07-11 DIAGNOSIS — M23321 Other meniscus derangements, posterior horn of medial meniscus, right knee: Secondary | ICD-10-CM

## 2019-07-11 NOTE — Progress Notes (Signed)
Kenneth Bridges  07/11/2019  Body mass index is 41.84 kg/m.   HISTORY SECTION :  Chief Complaint  Patient presents with  . Knee Pain    right knee pain since August    HPI The patient presents for evaluation of  (mild/moderate/severe/ ) severe pain, in the (right /left) right knee, for 4 months, associated with swelling trouble climbing stairs trouble kneeling.  Prior treatment 1 round of prednisone followed by 1 round of anti-inflammatories followed by a second round of prednisone  53 year old male woke up with pain in his knee medial side since that time he can get back to normal activities and can exercise he lost 30 pounds and wants to get back to his routine   Review of Systems  All other systems reviewed and are negative.    has a past medical history of Hypertension, Nasal septal deviation, Sleep apnea, and Wears glasses.   Past Surgical History:  Procedure Laterality Date  . ANKLE FRACTURE SURGERY Left 1988  . COLONOSCOPY N/A 05/19/2016   Procedure: COLONOSCOPY;  Surgeon: Daneil Dolin, MD;  Location[facility];  Service: Endoscopy;  Laterality: N/A;  9:30 am  . HERNIA REPAIR    . NASAL SEPTOPLASTY W/ TURBINOPLASTY  02/13/2016   NASAL SEPTOPLASTY WITH TURBINATE REDUCTION (Bilateral)  . NASAL SEPTOPLASTY W/ TURBINOPLASTY Bilateral 02/13/2016   Procedure: NASAL SEPTOPLASTY WITH TURBINATE REDUCTION;  Surgeon: Melida Quitter, MD;  Location: [facility];  Service: ENT;  Laterality: Bilateral;    Body mass index is 41.84 kg/m.   No Known Allergies   Current Outpatient Medications:  .  telmisartan-hydrochlorothiazide (MICARDIS HCT) 80-12.5 MG tablet, Take 1 tablet by mouth daily., Disp: , Rfl:    PHYSICAL EXAM SECTION: 1) BP (!) 176/89   Pulse 60   Temp 98.8 F (37.1 C)   Ht 5\' 11"  (1.803 m)   Wt 300 lb (136.1 kg)   BMI 41.84 kg/m   Body mass index is 41.84 kg/m. General appearance: Well-developed well-nourished no gross deformities  2) Cardiovascular  normal pulse and perfusion in the lower  extremities normal color without edema  3) Neurologically deep tendon reflexes are equal and normal, no sensation loss or deficits no pathologic reflexes  4) Psychological: Awake alert and oriented x3 mood and affect normal  5) Skin no lacerations or ulcerations no nodularity no palpable masses, no erythema or nodularity  6) Musculoskeletal:   Left knee normal strength and range of motion no tenderness or swelling McMurray's negative  Right knee tender medial joint line positive McMurray's effusion decreased full extension flexion is limited to 125 degrees strength and muscle tone normal   MEDICAL DECISION SECTION:  Encounter Diagnoses  Name Primary?  . Chronic pain of right knee Yes  . Derangement of posterior horn of medial meniscus of right knee     Imaging X-rays show arthritis medial compartment moderate  Plan:  (Rx., Inj., surg., Frx, MRI/CT, XR:2)  MRI right knee  1:50 PM Arther Abbott, MD  07/11/2019

## 2019-07-11 NOTE — Patient Instructions (Addendum)
We will obtain pre-certification from the insurer and call you to schedule the study. Dr Harrison will call you with the results   Meniscus Tear  A meniscus tear is a knee injury that happens when a piece of the meniscus is torn. The meniscus is a thick, rubbery, wedge-shaped cartilage in the knee. Two menisci are located in each knee. They sit between the upper bone (femur) and lower bone (tibia) that make up the knee joint. Each meniscus acts as a shock absorber for the knee. A torn meniscus is one of the most common types of knee injuries. This injury can range from mild to severe. Surgery may be needed to repair a severe tear. What are the causes? This condition may be caused by any kneeling, squatting, twisting, or pivoting movement. Sports-related injuries are the most common cause. These often occur from:  Running and stopping suddenly. ? Changing direction. ? Being tackled or knocked off your feet.  Lifting or carrying heavy weights. As people get older, their menisci get thinner and weaker. In these people, tears can happen more easily, such as from climbing stairs. What increases the risk? You are more likely to develop this condition if you:  Play contact sports.  Have a job that requires kneeling or squatting.  Are male.  Are over 40 years old. What are the signs or symptoms? Symptoms of this condition include:  Knee pain, especially at the side of the knee joint. You may feel pain when the injury occurs, or you may only hear a pop and feel pain later.  A feeling that your knee is clicking, catching, locking, or giving way.  Not being able to fully bend or extend your knee.  Bruising or swelling in your knee. How is this diagnosed? This condition may be diagnosed based on your symptoms and a physical exam. You may also have tests, such as:  X-rays.  MRI.  A procedure to look inside your knee with a narrow surgical telescope (arthroscopy). You may be referred to  a knee specialist (orthopedic surgeon). How is this treated? Treatment for this injury depends on the severity of the tear. Treatment for a mild tear may include:  Rest.  Medicine to reduce pain and swelling. This is usually a nonsteroidal anti-inflammatory drug (NSAID), like ibuprofen.  A knee brace, sleeve, or wrap.  Using crutches or a walker to keep weight off your knee and to help you walk.  Exercises to strengthen your knee (physical therapy). You may need surgery if you have a severe tear or if other treatments are not working. Follow these instructions at home: If you have a brace, sleeve, or wrap:  Wear it as told by your health care provider. Remove it only as told by your health care provider.  Loosen the brace, sleeve, or wrap if your toes tingle, become numb, or turn cold and blue.  Keep the brace, sleeve, or wrap clean and dry.  If the brace, sleeve, or wrap is not waterproof: ? Do not let it get wet. ? Cover it with a watertight covering when you take a bath or shower. Managing pain and swelling   Take over-the-counter and prescription medicines only as told by your health care provider.  If directed, put ice on your knee: ? If you have a removable brace, sleeve, or wrap, remove it as told by your health care provider. ? Put ice in a plastic bag. ? Place a towel between your skin and the bag. ? Leave   ice on for 20 minutes, 2-3 times per day.  Move your toes often to avoid stiffness and to lessen swelling.  Raise (elevate) the injured area above the level of your heart while you are sitting or lying down. Activity  Do not use the injured limb to support your body weight until your health care provider says that you can. Use crutches or a walker as told by your health care provider.  Return to your normal activities as told by your health care provider. Ask your health care provider what activities are safe for you.  Perform range-of-motion exercises only  as told by your health care provider.  Begin doing exercises to strengthen your knee and leg muscles only as told by your health care provider. After you recover, your health care provider may recommend these exercises to help prevent another injury. General instructions  Use a knee brace, sleeve, or wrap as told by your health care provider.  Ask your health care provider when it is safe to drive if you have a brace, sleeve, or wrap on your knee.  Do not use any products that contain nicotine or tobacco, such as cigarettes, e-cigarettes, and chewing tobacco. If you need help quitting, ask your health care provider.  Ask your health care provider if the medicine prescribed to you: ? Requires you to avoid driving or using heavy machinery. ? Can cause constipation. You may need to take these actions to prevent or treat constipation:  Drink enough fluid to keep your urine pale yellow.  Take over-the-counter or prescription medicines.  Eat foods that are high in fiber, such as beans, whole grains, and fresh fruits and vegetables.  Limit foods that are high in fat and processed sugars, such as fried or sweet foods.  Keep all follow-up visits as told by your health care provider. This is important. Contact a health care provider if:  You have a fever.  Your knee becomes red, tender, or swollen.  Your pain medicine is not helping.  Your symptoms get worse or do not improve after 2 weeks of home care. Summary  A meniscus tear is a knee injury that happens when a piece of the meniscus is torn.  Treatment for this injury depends on the severity of the tear. You may need surgery if you have a severe tear or if other treatments are not working.  Rest, ice, and raise (elevate) your injured knee as told by your health care provider. This will help lessen pain and swelling.  Contact a health care provider if you have new symptoms, or your symptoms get worse or do not improve after 2 weeks  of home care.  Keep all follow-up visits as told by your health care provider. This is important. This information is not intended to replace advice given to you by your health care provider. Make sure you discuss any questions you have with your health care provider. Document Released: 10/16/2002 Document Revised: 02/07/2018 Document Reviewed: 02/07/2018 Elsevier Patient Education  2020 [employer].

## 2019-08-07 ENCOUNTER — Ambulatory Visit
Admission: RE | Admit: 2019-08-07 | Discharge: 2019-08-07 | Disposition: A | Discharge status: Home / Self Care | Payer: BLUE CROSS/BLUE SHIELD | Source: Ambulatory Visit | Attending: Orthopedic Surgery | Admitting: Orthopedic Surgery

## 2019-08-07 ENCOUNTER — Other Ambulatory Visit: Payer: Self-pay

## 2019-08-07 ENCOUNTER — Other Ambulatory Visit: Payer: BC Managed Care – PPO

## 2019-08-07 DIAGNOSIS — M23221 Derangement of posterior horn of medial meniscus due to old tear or injury, right knee: Secondary | ICD-10-CM | POA: Diagnosis not present

## 2019-08-07 DIAGNOSIS — G8929 Other chronic pain: Secondary | ICD-10-CM

## 2019-08-07 DIAGNOSIS — M25561 Pain in right knee: Secondary | ICD-10-CM

## 2019-08-07 DIAGNOSIS — S83231A Complex tear of medial meniscus, current injury, right knee, initial encounter: Secondary | ICD-10-CM | POA: Diagnosis not present

## 2019-08-09 ENCOUNTER — Telehealth: Payer: Self-pay | Admitting: Radiology

## 2019-08-09 ENCOUNTER — Telehealth: Payer: Self-pay | Admitting: Orthopedic Surgery

## 2019-08-09 NOTE — Telephone Encounter (Signed)
Patient called and asked that you please call him with the MRI results.

## 2019-08-09 NOTE — Telephone Encounter (Signed)
IMPRESSION: Complex tear of the medial meniscus with posterior parameniscal cyst. Associated focal osteochondral lesion weight-bearing surfaces medial femoral condyle with loss of cartilage and reactive marrow edema.  Joint effusion.   Electronically Signed   By: Leticia Penna M.D.   On: 08/07/2019 08:38

## 2019-08-13 ENCOUNTER — Telehealth: Payer: Self-pay | Admitting: Orthopedic Surgery

## 2019-08-13 NOTE — Telephone Encounter (Signed)
If he can not hear over phone, make him an appointment to discuss, thanks

## 2019-08-13 NOTE — Telephone Encounter (Signed)
Called back to patient; scheduled; aware.

## 2019-08-13 NOTE — Telephone Encounter (Signed)
Patient states he and Dr Romeo Apple have 'missed each other on phone' regarding his MRI results. York Spaniel he is aware of the findings; asking what the next step is - another appointment or surgery? 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

## 2019-08-15 ENCOUNTER — Telehealth: Payer: Self-pay | Admitting: Orthopedic Surgery

## 2019-08-15 NOTE — Telephone Encounter (Signed)
Message left to call the office between [phone] and 130 regarding his MRI

## 2019-08-20 ENCOUNTER — Other Ambulatory Visit: Payer: Self-pay

## 2019-08-20 ENCOUNTER — Encounter: Payer: Self-pay | Admitting: Orthopedic Surgery

## 2019-08-20 ENCOUNTER — Ambulatory Visit (INDEPENDENT_AMBULATORY_CARE_PROVIDER_SITE_OTHER): Payer: BLUE CROSS/BLUE SHIELD | Admitting: Orthopedic Surgery

## 2019-08-20 VITALS — BP 176/92 | HR 68 | Ht 71.0 in | Wt 300.0 lb

## 2019-08-20 DIAGNOSIS — G8929 Other chronic pain: Secondary | ICD-10-CM

## 2019-08-20 DIAGNOSIS — M25561 Pain in right knee: Secondary | ICD-10-CM

## 2019-08-20 DIAGNOSIS — M23321 Other meniscus derangements, posterior horn of medial meniscus, right knee: Secondary | ICD-10-CM | POA: Diagnosis not present

## 2019-08-20 MED ORDER — ACETAMINOPHEN-CODEINE #3 300-30 MG PO TABS
1.0000 | ORAL_TABLET | Freq: Four times a day (QID) | ORAL | 0 refills | Status: DC | PRN
Start: 2019-08-20 — End: 2019-08-28

## 2019-08-20 MED ORDER — ACETAMINOPHEN-CODEINE #3 300-30 MG PO TABS: 1.0000 | ORAL_TABLET | Freq: Four times a day (QID) | ORAL | 0 refills | Status: DC | PRN

## 2019-08-20 NOTE — Patient Instructions (Addendum)
You have surgery on your right knee  You will follow-up about 1 week after surgery  We plan to remove the torn meniscus and drill into the bone to stimulate new cartilage  It will take 4 weeks to recover  Please read the information below  Meniscus Injury, Arthroscopy   Arthroscopy is a surgical procedure that involves the use of a small scope that has a camera and surgical instruments on the end (arthroscope). An arthroscope can be used to repair your meniscus injury.  LET [facility] CARE PROVIDER KNOW ABOUT:  Any allergies you have.  All medicines you are taking, including vitamins, herbs, eyedrops, creams, and over-the-counter medicines.  Any recent colds or infections you have had or currently have.  Previous problems you or members of your family have had with the use of anesthetics.  Any blood disorders or blood clotting problems you have.  Previous surgeries you have had.  Medical conditions you have. RISKS AND COMPLICATIONS Generally, this is a safe procedure. However, as with any procedure, problems can occur. Possible problems include:  Damage to nerves or blood vessels.  Excess bleeding.  Blood clots.  Infection. BEFORE THE PROCEDURE  Do not eat or drink for 6-8 hours before the procedure.  Take medicines as directed by your surgeon. Ask your surgeon about changing or stopping your regular medicines.  You may have lab tests the morning of surgery. PROCEDURE  You will be given one of the following:   A medicine that numbs the area (local anesthesia).  A medicine that makes you go to sleep (general anesthesia).  A medicine injected into your spine that numbs your body below the waist (spinal anesthesia). Most often, several small cuts (incisions) are made in the knee. The arthroscope and instruments go into the incisions to repair the damage. The torn portion of the meniscus is removed.   AFTER THE PROCEDURE  You will be taken to the recovery area  where your progress will be monitored. When you are awake, stable, and taking fluids without complications, you will be allowed to go home. This is usually the same day. A torn or stretched ligament (ligament sprain) may take 6-8 weeks to heal.   It takes about the 4-6 WEEKS if your surgeon removed a torn meniscus.  A repaired meniscus may require 6-12 weeks of recovery time.  A torn ligament needing reconstructive surgery may take 6-12 months to heal fully.   This information is not intended to replace advice given to you by your health care provider. Make sure you discuss any questions you have with your health care provider. You have decided to proceed with operative arthroscopy of the knee. You have decided not to continue with nonoperative measures such as but not limited to oral medication, weight loss, activity modification, physical therapy, bracing, or injection.  We will perform operative arthroscopy of the knee. Some of the risks associated with arthroscopic surgery of the knee include but are not limited to Bleeding Infection Swelling Stiffness Blood clot Pain Need for knee replacement surgery    In compliance with recent [place] law in federal regulation regarding opioid use and abuse and addiction, we will taper (stop) opioid medication after 2 weeks.  If you're not comfortable with these risks and would like to continue with nonoperative treatment please let Dr. Romeo Apple know prior to your surgery.

## 2019-08-20 NOTE — Progress Notes (Signed)
Chief Complaint  Patient presents with  . Knee Pain    right   . Results    review MRI     Kenneth Bridges is 54 years old he presented to [place] with a 63-month history of pain and swelling in his right knee which has not gotten any better.  We sent him for MRI he comes back for discussion of his results and further treatment recommendations  Prior nonoperative treatment included 2 rounds of prednisone and 1 round of anti-inflammatories as well as activity modification  He continues to have a negative review of systems in terms of his overall medical health  He does have a history of hypertension nasal septal deviation sleep apnea and he does wear glasses  Past Surgical History:  Procedure Laterality Date  . ANKLE FRACTURE SURGERY Left 1988  . COLONOSCOPY N/A 05/19/2016   Procedure: COLONOSCOPY;  Surgeon: Corbin Ade, MD;  [facility];  Service: Endoscopy;  Laterality: N/A;  9:30 am  . HERNIA REPAIR    . NASAL SEPTOPLASTY W/ TURBINOPLASTY  02/13/2016   NASAL SEPTOPLASTY WITH TURBINATE REDUCTION (Bilateral)  . NASAL SEPTOPLASTY W/ TURBINOPLASTY Bilateral 02/13/2016   Procedure: NASAL SEPTOPLASTY WITH TURBINATE REDUCTION;  Surgeon: Christia Reading, MD;  Location: [facility] OR;  Service: ENT;  Laterality: Bilateral;   Family History  Problem Relation Age of Onset  . Cancer Mother   . Colon cancer Neg Hx   . Colon polyps Neg Hx    No Known Allergies BP (!) 176/92   Pulse 68   Ht 5\' 11"  (1.803 m)   Wt 300 lb (136.1 kg)   BMI 41.84 kg/m   He is awake alert and oriented x3 mood and affect are normal  Appearance mesomorphic body habitus  Gait is normal  As we noted previously his right knee was tender over the medial joint line where he continues to have pain he had a positive McMurray's effusion decreased range of motion including flexion extension strength and muscle tone were normal  I interpret his MRI as torn medial meniscus chondral lesion medial femoral condyle arthritis  patellofemoral joint  Report was reviewed and also confirms is included by reference  Plan is now for elective surgery  Arthroscopy right knee partial medial meniscectomy possible microfracture  Meds ordered this encounter  Medications  . DISCONTD: acetaminophen-codeine (TYLENOL #3) 300-30 MG tablet    Sig: Take 1 tablet by mouth every 6 (six) hours as needed for moderate pain.    Dispense:  30 tablet    Refill:  0  . acetaminophen-codeine (TYLENOL #3) 300-30 MG tablet    Sig: Take 1 tablet by mouth every 6 (six) hours as needed for moderate pain.    Dispense:  30 tablet    Refill:  0     Summation chronic illness with exacerbation progression and elective surgery planned and independent x-ray interpretation, prescription  The procedure has been fully reviewed with the patient; The risks and benefits of surgery have been discussed and explained and understood. Alternative treatment has also been reviewed, questions were encouraged and answered. The postoperative plan is also been reviewed.

## 2019-08-24 NOTE — Patient Instructions (Addendum)
Kenneth Bridges  08/24/2019     @PREFPERIOPPHARMACY @   Your procedure is scheduled on  08/28/2019  Report to [name] at  [place].  Call this number if you have problems the morning of surgery:  [phone]   Remember:  Do not eat or drink after midnight.                         Take these medicines the morning of surgery with A SIP OF WATER tylenol#3(if needed), micardis.    Do not wear jewelry, make-up or nail polish.  Do not wear lotions, powders, or perfumes. Please wear deodorant and brush your teeth.  Do not shave 48 hours prior to surgery.  Men may shave face and neck.  Do not bring valuables to the hospital.  [name] is not responsible for any belongings or valuables.  Contacts, dentures or bridgework may not be worn into surgery.  Leave your suitcase in the car.  After surgery it may be brought to your room.  For patients admitted to the hospital, discharge time will be determined by your treatment team.  Patients discharged the day of surgery will not be allowed to drive home.   Name and phone number of your driver:   family Special instructions:  None  Please read over the following fact sheets that you were given. Anesthesia Post-op Instructions and Care and Recovery After Surgery       Arthroscopic Knee Ligament Repair, Care After This sheet gives you information about how to care for yourself after your procedure. Your health care provider may also give you more specific instructions. If you have problems or questions, contact your health care provider. What can I expect after the procedure? After the procedure, it is common to have:  Pain in your knee.  Bruising and swelling on your knee, calf, and ankle for 3-4 days.  Fatigue. Follow these instructions at home: If you have a brace or immobilizer:  Wear the brace or immobilizer as told by your health care provider. Remove it only as told by your health care  provider.  Loosen the splint or immobilizer if your toes tingle, become numb, or turn cold and blue.  Keep the brace or immobilizer clean. Bathing  Do not take baths, swim, or use a hot tub until your health care provider approves. Ask your health care provider if you can take showers.  Keep your bandage (dressing) dry until your health care provider says that it can be removed. Cover it and your brace or immobilizer with a watertight covering when you take a shower. Incision care   Follow instructions from your health care provider about how to take care of your incision. Make sure you: ? Wash your hands with soap and water before you change your bandage (dressing). If soap and water are not available, use hand sanitizer. ? Change your dressing as told by your health care provider. ? Leave stitches (sutures), skin glue, or adhesive strips in place. These skin closures may need to stay in place for 2 weeks or longer. If adhesive strip edges start to loosen and curl up, you may trim the loose edges. Do not remove adhesive strips completely unless your health care provider tells you to do that.  Check your incision area every day for signs of infection. Check for: ? More redness, swelling, or pain. ? More fluid or blood. ?  Warmth. ? Pus or a bad smell. Managing pain, stiffness, and swelling   If directed, put ice on the affected area. ? If you have a removable brace or immobilizer, remove it as told by your health care provider. ? Put ice in a plastic bag. ? Place a towel between your skin and the bag or between your brace or immobilizer and the bag. ? Leave the ice on for 20 minutes, 2-3 times a day.  Move your toes often to avoid stiffness and to lessen swelling.  Raise (elevate) the injured area above the level of your heart while you are sitting or lying down. Driving  Do not drive until your health care provider approves. If you have a brace or immobilizer on your leg, ask  your health care provider when it is safe for you to drive.  Do not drive or use heavy machinery while taking prescription pain medicine. Activity  Rest as directed. Ask your health care provider what activities are safe for you.  Do physical therapy exercises as told by your health care provider. Physical therapy will help you regain strength and motion in your knee.  Follow instructions from your health care provider about: ? When you may start motion exercises. ? When you may start riding a stationary bike and doing other low-impact activities. ? When you may start to jog and do other high-impact activities. Safety  Do not use the injured limb to support your body weight until your health care provider says that you can. Use crutches as told by your health care provider. General instructions  Do not use any products that contain nicotine or tobacco, such as cigarettes and e-cigarettes. These can delay bone healing. If you need help quitting, ask your health care provider.  To prevent or treat constipation while you are taking prescription pain medicine, your health care provider may recommend that you: ? Drink enough fluid to keep your urine clear or pale yellow. ? Take over-the-counter or prescription medicines. ? Eat foods that are high in fiber, such as fresh fruits and vegetables, whole grains, and beans. ? Limit foods that are high in fat and processed sugars, such as fried and sweet foods.  Take over-the-counter and prescription medicines only as told by your health care provider.  Keep all follow-up visits as told by your health care provider. This is important. Contact a health care provider if:  You have more redness, swelling, or pain around an incision.  You have more fluid or blood coming from an incision.  Your incision feels warm to the touch.  You have a fever.  You have pain or swelling in your knee, and it gets worse.  You have pain that does not get  better with medicine. Get help right away if:  You have trouble breathing.  You have pus or a bad smell coming from an incision.  You have numbness and tingling near the knee joint. Summary  After the procedure, it is common to have knee pain with bruising and swelling on your knee, calf, and ankle.  Icing your knee and raising your leg above the level of your heart will help control the pain and the swelling.  Do physical therapy exercises as told by your health care provider. Physical therapy will help you regain strength and motion in your knee. This information is not intended to replace advice given to you by your health care provider. Make sure you discuss any questions you have with your health care  provider. Document Revised: 07/08/2017 Document Reviewed: 07/20/2016 Elsevier Patient Education  2020 [employer]  General Anesthesia, Adult, Care After This sheet gives you information about how to care for yourself after your procedure. Your health care provider may also give you more specific instructions. If you have problems or questions, contact your health care provider. What can I expect after the procedure? After the procedure, the following side effects are common:  Pain or discomfort at the IV site.  Nausea.  Vomiting.  Sore throat.  Trouble concentrating.  Feeling cold or chills.  Weak or tired.  Sleepiness and fatigue.  Soreness and body aches. These side effects can affect parts of the body that were not involved in surgery. Follow these instructions at home:  For at least 24 hours after the procedure:  Have a responsible adult stay with you. It is important to have someone help care for you until you are awake and alert.  Rest as needed.  Do not: ? Participate in activities in which you could fall or become injured. ? Drive. ? Use heavy machinery. ? Drink alcohol. ? Take sleeping pills or medicines that cause drowsiness. ? Make important  decisions or sign legal documents. ? Take care of children on your own. Eating and drinking  Follow any instructions from your health care provider about eating or drinking restrictions.  When you feel hungry, start by eating small amounts of foods that are soft and easy to digest (bland), such as toast. Gradually return to your regular diet.  Drink enough fluid to keep your urine pale yellow.  If you vomit, rehydrate by drinking water, juice, or clear broth. General instructions  If you have sleep apnea, surgery and certain medicines can increase your risk for breathing problems. Follow instructions from your health care provider about wearing your sleep device: ? Anytime you are sleeping, including during daytime naps. ? While taking prescription pain medicines, sleeping medicines, or medicines that make you drowsy.  Return to your normal activities as told by your health care provider. Ask your health care provider what activities are safe for you.  Take over-the-counter and prescription medicines only as told by your health care provider.  If you smoke, do not smoke without supervision.  Keep all follow-up visits as told by your health care provider. This is important. Contact a health care provider if:  You have nausea or vomiting that does not get better with medicine.  You cannot eat or drink without vomiting.  You have pain that does not get better with medicine.  You are unable to pass urine.  You develop a skin rash.  You have a fever.  You have redness around your IV site that gets worse. Get help right away if:  You have difficulty breathing.  You have chest pain.  You have blood in your urine or stool, or you vomit blood. Summary  After the procedure, it is common to have a sore throat or nausea. It is also common to feel tired.  Have a responsible adult stay with you for the first 24 hours after general anesthesia. It is important to have someone help  care for you until you are awake and alert.  When you feel hungry, start by eating small amounts of foods that are soft and easy to digest (bland), such as toast. Gradually return to your regular diet.  Drink enough fluid to keep your urine pale yellow.  Return to your normal activities as told by your health care  provider. Ask your health care provider what activities are safe for you. This information is not intended to replace advice given to you by your health care provider. Make sure you discuss any questions you have with your health care provider. Document Revised: 07/29/2017 Document Reviewed: 03/11/2017 Elsevier Patient Education  2020 [employer]. How to Use Chlorhexidine for Bathing Chlorhexidine gluconate (CHG) is a germ-killing (antiseptic) solution that is used to clean the skin. It can get rid of the bacteria that normally live on the skin and can keep them away for about 24 hours. To clean your skin with CHG, you may be given:  A CHG solution to use in the shower or as part of a sponge bath.  A prepackaged cloth that contains CHG. Cleaning your skin with CHG may help lower the risk for infection:  While you are staying in the intensive care unit of the hospital.  If you have a vascular access, such as a central line, to provide short-term or long-term access to your veins.  If you have a catheter to drain urine from your bladder.  If you are on a ventilator. A ventilator is a machine that helps you breathe by moving air in and out of your lungs.  After surgery. What are the risks? Risks of using CHG include:  A skin reaction.  Hearing loss, if CHG gets in your ears.  Eye injury, if CHG gets in your eyes and is not rinsed out.  The CHG product catching fire. Make sure that you avoid smoking and flames after applying CHG to your skin. Do not use CHG:  If you have a chlorhexidine allergy or have previously reacted to chlorhexidine.  On babies younger than 90  months of age. How to use CHG solution  Use CHG only as told by your health care provider, and follow the instructions on the label.  Use the full amount of CHG as directed. Usually, this is one bottle. During a shower Follow these steps when using CHG solution during a shower (unless your health care provider gives you different instructions): 1. Start the shower. 2. Use your normal soap and shampoo to wash your face and hair. 3. Turn off the shower or move out of the shower stream. 4. Pour the CHG onto a clean washcloth. Do not use any type of brush or rough-edged sponge. 5. Starting at your neck, lather your body down to your toes. Make sure you follow these instructions: ? If you will be having surgery, pay special attention to the part of your body where you will be having surgery. Scrub this area for at least 1 minute. ? Do not use CHG on your head or face. If the solution gets into your ears or eyes, rinse them well with water. ? Avoid your genital area. ? Avoid any areas of skin that have broken skin, cuts, or scrapes. ? Scrub your back and under your arms. Make sure to wash skin folds. 6. Let the lather sit on your skin for 1-2 minutes or as long as told by your health care provider. 7. Thoroughly rinse your entire body in the shower. Make sure that all body creases and crevices are rinsed well. 8. Dry off with a clean towel. Do not put any substances on your body afterward--such as powder, lotion, or perfume--unless you are told to do so by your health care provider. Only use lotions that are recommended by the manufacturer. 9. Put on clean clothes or pajamas. 10. If  it is the night before your surgery, sleep in clean sheets.  During a sponge bath Follow these steps when using CHG solution during a sponge bath (unless your health care provider gives you different instructions): 1. Use your normal soap and shampoo to wash your face and hair. 2. Pour the CHG onto a clean  washcloth. 3. Starting at your neck, lather your body down to your toes. Make sure you follow these instructions: ? If you will be having surgery, pay special attention to the part of your body where you will be having surgery. Scrub this area for at least 1 minute. ? Do not use CHG on your head or face. If the solution gets into your ears or eyes, rinse them well with water. ? Avoid your genital area. ? Avoid any areas of skin that have broken skin, cuts, or scrapes. ? Scrub your back and under your arms. Make sure to wash skin folds. 4. Let the lather sit on your skin for 1-2 minutes or as long as told by your health care provider. 5. Using a different clean, wet washcloth, thoroughly rinse your entire body. Make sure that all body creases and crevices are rinsed well. 6. Dry off with a clean towel. Do not put any substances on your body afterward--such as powder, lotion, or perfume--unless you are told to do so by your health care provider. Only use lotions that are recommended by the manufacturer. 7. Put on clean clothes or pajamas. 8. If it is the night before your surgery, sleep in clean sheets. How to use CHG prepackaged cloths  Only use CHG cloths as told by your health care provider, and follow the instructions on the label.  Use the CHG cloth on clean, dry skin.  Do not use the CHG cloth on your head or face unless your health care provider tells you to.  When washing with the CHG cloth: ? Avoid your genital area. ? Avoid any areas of skin that have broken skin, cuts, or scrapes. Before surgery Follow these steps when using a CHG cloth to clean before surgery (unless your health care provider gives you different instructions): 1. Using the CHG cloth, vigorously scrub the part of your body where you will be having surgery. Scrub using a back-and-forth motion for 3 minutes. The area on your body should be completely wet with CHG when you are done scrubbing. 2. Do not rinse. Discard  the cloth and let the area air-dry. Do not put any substances on the area afterward, such as powder, lotion, or perfume. 3. Put on clean clothes or pajamas. 4. If it is the night before your surgery, sleep in clean sheets.  For general bathing Follow these steps when using CHG cloths for general bathing (unless your health care provider gives you different instructions). 1. Use a separate CHG cloth for each area of your body. Make sure you wash between any folds of skin and between your fingers and toes. Wash your body in the following order, switching to a new cloth after each step: ? The front of your neck, shoulders, and chest. ? Both of your arms, under your arms, and your hands. ? Your stomach and groin area, avoiding the genitals. ? Your right leg and foot. ? Your left leg and foot. ? The back of your neck, your back, and your buttocks. 2. Do not rinse. Discard the cloth and let the area air-dry. Do not put any substances on your body afterward--such as powder, lotion,  or perfume--unless you are told to do so by your health care provider. Only use lotions that are recommended by the manufacturer. 3. Put on clean clothes or pajamas. Contact a health care provider if:  Your skin gets irritated after scrubbing.  You have questions about using your solution or cloth. Get help right away if:  Your eyes become very red or swollen.  Your eyes itch badly.  Your skin itches badly and is red or swollen.  Your hearing changes.  You have trouble seeing.  You have swelling or tingling in your mouth or throat.  You have trouble breathing.  You swallow any chlorhexidine. Summary  Chlorhexidine gluconate (CHG) is a germ-killing (antiseptic) solution that is used to clean the skin. Cleaning your skin with CHG may help to lower your risk for infection.  You may be given CHG to use for bathing. It may be in a bottle or in a prepackaged cloth to use on your skin. Carefully follow your  health care provider's instructions and the instructions on the product label.  Do not use CHG if you have a chlorhexidine allergy.  Contact your health care provider if your skin gets irritated after scrubbing. This information is not intended to replace advice given to you by your health care provider. Make sure you discuss any questions you have with your health care provider. Document Revised: 10/12/2018 Document Reviewed: 06/23/2017 Elsevier Patient Education  2020 [employer].

## 2019-08-27 ENCOUNTER — Encounter (HOSPITAL_COMMUNITY): Payer: Self-pay

## 2019-08-27 ENCOUNTER — Other Ambulatory Visit: Payer: Self-pay | Admitting: Orthopedic Surgery

## 2019-08-27 ENCOUNTER — Other Ambulatory Visit (HOSPITAL_COMMUNITY)
Admission: RE | Admit: 2019-08-27 | Discharge: 2019-08-27 | Disposition: A | Discharge status: Home / Self Care | Payer: BLUE CROSS/BLUE SHIELD | Source: Ambulatory Visit | Attending: Orthopedic Surgery | Admitting: Orthopedic Surgery

## 2019-08-27 ENCOUNTER — Encounter (HOSPITAL_COMMUNITY)
Admission: RE | Admit: 2019-08-27 | Discharge: 2019-08-27 | Disposition: A | Discharge status: Home / Self Care | Payer: BLUE CROSS/BLUE SHIELD | Source: Ambulatory Visit | Attending: Orthopedic Surgery | Admitting: Orthopedic Surgery

## 2019-08-27 ENCOUNTER — Other Ambulatory Visit: Payer: Self-pay

## 2019-08-27 DIAGNOSIS — Z01812 Encounter for preprocedural laboratory examination: Secondary | ICD-10-CM | POA: Insufficient documentation

## 2019-08-27 LAB — CBC WITH DIFFERENTIAL/PLATELET
Abs Immature Granulocytes: 0.09 10*3/uL — ABNORMAL HIGH (ref 0.00–0.07)
Basophils Absolute: 0.1 10*3/uL (ref 0.0–0.1)
Basophils Relative: 1 %
Eosinophils Absolute: 0.2 10*3/uL (ref 0.0–0.5)
Eosinophils Relative: 3 %
HCT: 46.3 % (ref 39.0–52.0)
Hemoglobin: 15.3 g/dL (ref 13.0–17.0)
Immature Granulocytes: 1 %
Lymphocytes Relative: 19 %
Lymphs Abs: 1.5 10*3/uL (ref 0.7–4.0)
MCH: 29.6 pg (ref 26.0–34.0)
MCHC: 33 g/dL (ref 30.0–36.0)
MCV: 89.6 fL (ref 80.0–100.0)
Monocytes Absolute: 0.7 10*3/uL (ref 0.1–1.0)
Monocytes Relative: 9 %
Neutro Abs: 5.4 10*3/uL (ref 1.7–7.7)
Neutrophils Relative %: 67 %
Platelets: 204 10*3/uL (ref 150–400)
RBC: 5.17 MIL/uL (ref 4.22–5.81)
RDW: 12.6 % (ref 11.5–15.5)
WBC: 7.9 10*3/uL (ref 4.0–10.5)
nRBC: 0 % (ref 0.0–0.2)

## 2019-08-27 LAB — BASIC METABOLIC PANEL
Anion gap: 7 (ref 5–15)
BUN: 12 mg/dL (ref 6–20)
CO2: 28 mmol/L (ref 22–32)
Calcium: 8.9 mg/dL (ref 8.9–10.3)
Chloride: 105 mmol/L (ref 98–111)
Creatinine, Ser: 0.84 mg/dL (ref 0.61–1.24)
GFR calc Af Amer: >60 mL/min (ref >60)
GFR calc non Af Amer: >60 mL/min (ref >60)
Glucose, Bld: 180 mg/dL — ABNORMAL HIGH (ref 70–99)
Potassium: 4.2 mmol/L (ref 3.5–5.1)
Sodium: 140 mmol/L (ref 135–145)

## 2019-08-27 LAB — SARS CORONAVIRUS 2 (TAT 6-24 HRS): SARS Coronavirus 2: NEGATIVE

## 2019-08-27 NOTE — Pre-Procedure Instructions (Signed)
Please excuse Kenneth Bridges from work today, 08/27/2019. He had  COVID-19 test done today prior to his procedure tomorrow, and has to be quarantined until his procedure is done. Thank You. If you have any questions, you may call [facility] at [phone].

## 2019-08-28 ENCOUNTER — Ambulatory Visit (HOSPITAL_COMMUNITY)
Admission: RE | Admit: 2019-08-28 | Discharge: 2019-08-28 | Disposition: A | Discharge status: Home / Self Care | Payer: BLUE CROSS/BLUE SHIELD | Attending: Orthopedic Surgery | Admitting: Orthopedic Surgery

## 2019-08-28 ENCOUNTER — Encounter (HOSPITAL_COMMUNITY): Payer: Self-pay | Admitting: Orthopedic Surgery

## 2019-08-28 ENCOUNTER — Encounter (HOSPITAL_COMMUNITY)
Admission: RE | Disposition: A | Discharge status: Home / Self Care | Payer: Self-pay | Source: Home / Self Care | Attending: Orthopedic Surgery

## 2019-08-28 ENCOUNTER — Ambulatory Visit (HOSPITAL_COMMUNITY): Payer: BLUE CROSS/BLUE SHIELD | Admitting: Anesthesiology

## 2019-08-28 ENCOUNTER — Encounter: Payer: Self-pay | Admitting: Orthopedic Surgery

## 2019-08-28 DIAGNOSIS — S83241A Other tear of medial meniscus, current injury, right knee, initial encounter: Secondary | ICD-10-CM | POA: Insufficient documentation

## 2019-08-28 DIAGNOSIS — Z6841 Body Mass Index (BMI) 40.0 and over, adult: Secondary | ICD-10-CM | POA: Diagnosis not present

## 2019-08-28 DIAGNOSIS — G4730 Sleep apnea, unspecified: Secondary | ICD-10-CM | POA: Insufficient documentation

## 2019-08-28 DIAGNOSIS — I10 Essential (primary) hypertension: Secondary | ICD-10-CM | POA: Diagnosis not present

## 2019-08-28 DIAGNOSIS — X58XXXA Exposure to other specified factors, initial encounter: Secondary | ICD-10-CM | POA: Insufficient documentation

## 2019-08-28 DIAGNOSIS — M23221 Derangement of posterior horn of medial meniscus due to old tear or injury, right knee: Secondary | ICD-10-CM | POA: Diagnosis not present

## 2019-08-28 HISTORY — PX: KNEE ARTHROSCOPY WITH MEDIAL MENISECTOMY: SHX5651

## 2019-08-28 SURGERY — ARTHROSCOPY, KNEE, WITH MEDIAL MENISCECTOMY
Anesthesia: General | Site: Knee | Laterality: Right

## 2019-08-28 MED ORDER — ONDANSETRON HCL 4 MG/2ML IJ SOLN
INTRAMUSCULAR | Status: DC | PRN
  Administered 2019-08-28: 4 mg via INTRAVENOUS

## 2019-08-28 MED ORDER — ONDANSETRON HCL 4 MG/2ML IJ SOLN
4.0000 mg | Freq: Once | INTRAMUSCULAR | Status: AC | PRN
  Administered 2019-08-28: 4 mg via INTRAVENOUS
  Filled 2019-08-28: qty 2

## 2019-08-28 MED ORDER — ONDANSETRON HCL 4 MG/2ML IJ SOLN: 4.0000 mg | Freq: Once | INTRAMUSCULAR | Status: DC

## 2019-08-28 MED ORDER — SUGAMMADEX SODIUM 500 MG/5ML IV SOLN
INTRAVENOUS | Status: DC | PRN
  Administered 2019-08-28: 200 mg via INTRAVENOUS

## 2019-08-28 MED ORDER — IBUPROFEN 800 MG PO TABS: 800.0000 mg | ORAL_TABLET | Freq: Three times a day (TID) | ORAL | 0 refills | Status: DC | PRN

## 2019-08-28 MED ORDER — SODIUM CHLORIDE 0.9 % IR SOLN
Status: DC | PRN
  Administered 2019-08-28: 1000 mL

## 2019-08-28 MED ORDER — PROPOFOL 10 MG/ML IV BOLUS
INTRAVENOUS | Status: DC | PRN
  Administered 2019-08-28: 300 mg via INTRAVENOUS
  Administered 2019-08-28 (×2): 50 mg via INTRAVENOUS

## 2019-08-28 MED ORDER — HYDROCODONE-ACETAMINOPHEN 5-325 MG PO TABS: 1.0000 | ORAL_TABLET | ORAL | 0 refills | Status: AC | PRN

## 2019-08-28 MED ORDER — ONDANSETRON HCL 4 MG/2ML IJ SOLN
INTRAMUSCULAR | Status: AC
  Filled 2019-08-28: qty 2

## 2019-08-28 MED ORDER — PROMETHAZINE HCL 12.5 MG PO TABS: 12.5000 mg | ORAL_TABLET | Freq: Four times a day (QID) | ORAL | 0 refills | Status: DC | PRN

## 2019-08-28 MED ORDER — HYDROMORPHONE HCL 1 MG/ML IJ SOLN
0.2500 mg | INTRAMUSCULAR | Status: DC | PRN
  Administered 2019-08-28: 09:00:00 0.5 mg via INTRAVENOUS
  Filled 2019-08-28: qty 0.5

## 2019-08-28 MED ORDER — ROCURONIUM BROMIDE 100 MG/10ML IV SOLN
INTRAVENOUS | Status: DC | PRN
  Administered 2019-08-28: 40 mg via INTRAVENOUS

## 2019-08-28 MED ORDER — IBUPROFEN 800 MG PO TABS
800.0000 mg | ORAL_TABLET | Freq: Once | ORAL | Status: AC
  Administered 2019-08-28: 800 mg via ORAL
  Filled 2019-08-28: qty 1

## 2019-08-28 MED ORDER — DEXAMETHASONE SODIUM PHOSPHATE 10 MG/ML IJ SOLN
INTRAMUSCULAR | Status: AC
  Filled 2019-08-28: qty 1

## 2019-08-28 MED ORDER — CEFAZOLIN SODIUM-DEXTROSE 2-4 GM/100ML-% IV SOLN
INTRAVENOUS | Status: AC
  Filled 2019-08-28: qty 100

## 2019-08-28 MED ORDER — SUGAMMADEX SODIUM 500 MG/5ML IV SOLN
INTRAVENOUS | Status: AC
  Filled 2019-08-28: qty 5

## 2019-08-28 MED ORDER — SODIUM CHLORIDE 0.9 % IR SOLN
Status: DC | PRN
  Administered 2019-08-28 (×3): 3000 mL

## 2019-08-28 MED ORDER — EPINEPHRINE PF 1 MG/ML IJ SOLN
INTRAMUSCULAR | Status: AC
  Filled 2019-08-28: qty 5

## 2019-08-28 MED ORDER — CHLORHEXIDINE GLUCONATE 4 % EX LIQD: 60.0000 mL | Freq: Once | CUTANEOUS | Status: DC

## 2019-08-28 MED ORDER — PROPOFOL 10 MG/ML IV BOLUS
INTRAVENOUS | Status: AC
  Filled 2019-08-28: qty 40

## 2019-08-28 MED ORDER — MEPERIDINE HCL 50 MG/ML IJ SOLN: 6.2500 mg | INTRAMUSCULAR | Status: DC | PRN

## 2019-08-28 MED ORDER — BUPIVACAINE-EPINEPHRINE (PF) 0.25% -1:200000 IJ SOLN
INTRAMUSCULAR | Status: AC
  Filled 2019-08-28: qty 60

## 2019-08-28 MED ORDER — BUPIVACAINE-EPINEPHRINE (PF) 0.5% -1:200000 IJ SOLN: INTRAMUSCULAR | Status: DC | PRN

## 2019-08-28 MED ORDER — DEXAMETHASONE SODIUM PHOSPHATE 10 MG/ML IJ SOLN
INTRAMUSCULAR | Status: DC | PRN
  Administered 2019-08-28: 10 mg via INTRAVENOUS

## 2019-08-28 MED ORDER — FENTANYL CITRATE (PF) 100 MCG/2ML IJ SOLN
INTRAMUSCULAR | Status: DC | PRN
  Administered 2019-08-28: 50 ug via INTRAVENOUS
  Administered 2019-08-28: 100 ug via INTRAVENOUS
  Administered 2019-08-28: 50 ug via INTRAVENOUS

## 2019-08-28 MED ORDER — SUCCINYLCHOLINE CHLORIDE 200 MG/10ML IV SOSY
PREFILLED_SYRINGE | INTRAVENOUS | Status: AC
  Filled 2019-08-28: qty 10

## 2019-08-28 MED ORDER — DEXTROSE 5 % IV SOLN
3.0000 g | INTRAVENOUS | Status: AC
  Administered 2019-08-28: 3 g via INTRAVENOUS
  Filled 2019-08-28: qty 3000

## 2019-08-28 MED ORDER — CEFAZOLIN SODIUM-DEXTROSE 2-4 GM/100ML-% IV SOLN: 2.0000 g | INTRAVENOUS | Status: DC

## 2019-08-28 MED ORDER — ROCURONIUM BROMIDE 10 MG/ML (PF) SYRINGE
PREFILLED_SYRINGE | INTRAVENOUS | Status: AC
  Filled 2019-08-28: qty 10

## 2019-08-28 MED ORDER — LACTATED RINGERS IV SOLN: INTRAVENOUS | Status: DC | PRN

## 2019-08-28 MED ORDER — LACTATED RINGERS IV SOLN: Freq: Once | INTRAVENOUS | Status: AC

## 2019-08-28 MED ORDER — BUPIVACAINE-EPINEPHRINE (PF) 0.25% -1:200000 IJ SOLN
INTRAMUSCULAR | Status: DC | PRN
  Administered 2019-08-28: 60 mL via PERINEURAL

## 2019-08-28 MED ORDER — HYDROCODONE-ACETAMINOPHEN 7.5-325 MG PO TABS
1.0000 | ORAL_TABLET | Freq: Once | ORAL | Status: AC
  Administered 2019-08-28: 1 via ORAL
  Filled 2019-08-28: qty 1

## 2019-08-28 MED ORDER — FENTANYL CITRATE (PF) 250 MCG/5ML IJ SOLN
INTRAMUSCULAR | Status: AC
  Filled 2019-08-28: qty 5

## 2019-08-28 MED ORDER — CEFAZOLIN SODIUM-DEXTROSE 1-4 GM/50ML-% IV SOLN
INTRAVENOUS | Status: AC
  Filled 2019-08-28: qty 50

## 2019-08-28 MED ORDER — SUCCINYLCHOLINE CHLORIDE 20 MG/ML IJ SOLN
INTRAMUSCULAR | Status: DC | PRN
  Administered 2019-08-28: 140 mg via INTRAVENOUS

## 2019-08-28 SURGICAL SUPPLY — 51 items
BANDAGE ELASTIC 6 VELCRO NS (GAUZE/BANDAGES/DRESSINGS) ×1 IMPLANT
BLADE AGGRESSIVE PLUS 4.0 (BLADE) ×2 IMPLANT
BLADE SURG SZ11 CARB STEEL (BLADE) ×2 IMPLANT
BNDG ELASTIC 6X5.8 VLCR NS LF (GAUZE/BANDAGES/DRESSINGS) ×2 IMPLANT
CHLORAPREP W/TINT 26 (MISCELLANEOUS) ×2 IMPLANT
CLOTH BEACON ORANGE TIMEOUT ST (SAFETY) ×2 IMPLANT
COOLER CRYO IC GRAV AND TUBE (ORTHOPEDIC SUPPLIES) ×2 IMPLANT
COVER WAND RF STERILE (DRAPES) ×2 IMPLANT
CUFF CRYO KNEE18X23 MED (MISCELLANEOUS) ×1 IMPLANT
CUFF TOURN SGL QUICK 34 (TOURNIQUET CUFF) ×1
CUFF TRNQT CYL 34X4.125X (TOURNIQUET CUFF) IMPLANT
CUTTER ANGLED DBL BITE 4.5 (BURR) IMPLANT
DECANTER SPIKE VIAL GLASS SM (MISCELLANEOUS) ×4 IMPLANT
DRAPE HALF SHEET 40X57 (DRAPES) ×2 IMPLANT
GAUZE 4X4 16PLY RFD (DISPOSABLE) ×2 IMPLANT
GAUZE SPONGE 4X4 12PLY STRL (GAUZE/BANDAGES/DRESSINGS) ×2 IMPLANT
GAUZE SPONGE 4X4 16PLY XRAY LF (GAUZE/BANDAGES/DRESSINGS) ×2 IMPLANT
GAUZE XEROFORM 5X9 LF (GAUZE/BANDAGES/DRESSINGS) ×2 IMPLANT
GLOVE BIO SURGEON STRL SZ7 (GLOVE) ×1 IMPLANT
GLOVE BIOGEL PI IND STRL 7.0 (GLOVE) ×2 IMPLANT
GLOVE BIOGEL PI INDICATOR 7.0 (GLOVE) ×2
GLOVE SKINSENSE NS SZ8.0 LF (GLOVE) ×1
GLOVE SKINSENSE STRL SZ8.0 LF (GLOVE) ×1 IMPLANT
GLOVE SS N UNI LF 8.5 STRL (GLOVE) ×2 IMPLANT
GOWN STRL REUS W/TWL LRG LVL3 (GOWN DISPOSABLE) ×2 IMPLANT
GOWN STRL REUS W/TWL XL LVL3 (GOWN DISPOSABLE) ×2 IMPLANT
IV NS IRRIG 3000ML ARTHROMATIC (IV SOLUTION) ×5 IMPLANT
KIT BLADEGUARD II DBL (SET/KITS/TRAYS/PACK) ×2 IMPLANT
KIT TURNOVER CYSTO (KITS) ×2 IMPLANT
MANIFOLD NEPTUNE II (INSTRUMENTS) ×2 IMPLANT
MARKER SKIN DUAL TIP RULER LAB (MISCELLANEOUS) ×2 IMPLANT
NDL HYPO 18GX1.5 BLUNT FILL (NEEDLE) ×1 IMPLANT
NDL HYPO 21X1.5 SAFETY (NEEDLE) ×1 IMPLANT
NDL SPNL 18GX3.5 QUINCKE PK (NEEDLE) ×1 IMPLANT
NEEDLE HYPO 18GX1.5 BLUNT FILL (NEEDLE) ×2 IMPLANT
NEEDLE HYPO 21X1.5 SAFETY (NEEDLE) ×2 IMPLANT
NEEDLE SPNL 18GX3.5 QUINCKE PK (NEEDLE) ×2 IMPLANT
NS IRRIG 1000ML POUR BTL (IV SOLUTION) ×2 IMPLANT
PACK ARTHRO LIMB DRAPE STRL (MISCELLANEOUS) ×2 IMPLANT
PAD ABD 5X9 TENDERSORB (GAUZE/BANDAGES/DRESSINGS) ×2 IMPLANT
PAD ARMBOARD 7.5X6 YLW CONV (MISCELLANEOUS) ×2 IMPLANT
PADDING CAST COTTON 6X4 STRL (CAST SUPPLIES) ×2 IMPLANT
PADDING WEBRIL 6 STERILE (GAUZE/BANDAGES/DRESSINGS) ×1 IMPLANT
PROBE BIPOLAR 50 DEGREE SUCT (MISCELLANEOUS) ×1 IMPLANT
SET ARTHROSCOPY INST (INSTRUMENTS) ×2 IMPLANT
SET BASIN LINEN APH (SET/KITS/TRAYS/PACK) ×2 IMPLANT
SUT ETHILON 3 0 FSL (SUTURE) ×2 IMPLANT
SYR 10ML LL (SYRINGE) ×2 IMPLANT
SYR 30ML LL (SYRINGE) ×2 IMPLANT
TUBE CONNECTING 12X1/4 (SUCTIONS) ×4 IMPLANT
TUBING ARTHRO INFLOW-ONLY STRL (TUBING) ×2 IMPLANT

## 2019-08-28 NOTE — Anesthesia Postprocedure Evaluation (Signed)
Anesthesia Post Note  Patient: Kenneth Bridges  Procedure(s) Performed: KNEE ARTHROSCOPY WITH MEDIAL MENISCECTOMY (Right Knee)  Patient location during evaluation: Phase II Anesthesia Type: General Level of consciousness: awake and alert and patient cooperative Pain management: satisfactory to patient Vital Signs Assessment: post-procedure vital signs reviewed and stable Respiratory status: spontaneous breathing Cardiovascular status: stable Postop Assessment: no apparent nausea or vomiting Anesthetic complications: no     Last Vitals:  Vitals:   08/28/19 0845 08/28/19 0900  BP: (!) 168/93   Pulse: 72 71  Resp: 13 16  Temp:    SpO2: 95% 95%    Last Pain:  Vitals:   08/28/19 0850  TempSrc:   PainSc: 4                  Millenia Waldvogel

## 2019-08-28 NOTE — Anesthesia Procedure Notes (Signed)
Procedure Name: Intubation Date/Time: 08/28/2019 7:38 AM Performed by: Franco Nones, CRNA Pre-anesthesia Checklist: Patient identified, Emergency Drugs available, Suction available, Patient being monitored and Timeout performed Patient Re-evaluated:Patient Re-evaluated prior to induction Oxygen Delivery Method: Circle system utilized Preoxygenation: Pre-oxygenation with 100% oxygen Induction Type: IV induction Laryngoscope Size: Glidescope and 3 Grade View: Grade I Tube type: Oral Tube size: 7.5 mm Number of attempts: 1 Airway Equipment and Method: Video-laryngoscopy,  Stylet and Oral airway Placement Confirmation: ETT inserted through vocal cords under direct vision,  positive ETCO2 and breath sounds checked- equal and bilateral Secured at: 22 cm Tube secured with: Tape Dental Injury: Teeth and Oropharynx as per pre-operative assessment

## 2019-08-28 NOTE — Transfer of Care (Signed)
Immediate Anesthesia Transfer of Care Note  Patient: Kenneth Bridges  Procedure(s) Performed: KNEE ARTHROSCOPY WITH MEDIAL MENISCECTOMY (Right Knee)  Patient Location: PACU  Anesthesia Type:General  Level of Consciousness: awake and patient cooperative  Airway & Oxygen Therapy: Patient Spontanous Breathing and non-rebreather face mask  Post-op Assessment: Report given to RN and Post -op Vital signs reviewed and stable  Post vital signs: Reviewed and stable  Last Vitals:  Vitals Value Taken Time  BP    Temp    Pulse 69 08/28/19 0830  Resp 18 08/28/19 0830  SpO2 97 % 08/28/19 0830  Vitals shown include unvalidated device data.  Last Pain:  Vitals:   08/28/19 0654  TempSrc: Oral  PainSc: 0-No pain      Patients Stated Pain Goal: 5 (08/28/19 0654)  Complications: No apparent anesthesia complications   SEE PACU FLOW SHEET FOR VITAL SIGNS

## 2019-08-28 NOTE — Brief Op Note (Signed)
08/28/2019  8:22 AM  PATIENT:  Kenneth Bridges  54 y.o. 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  PROCEDURE:  Procedure(s): KNEE ARTHROSCOPY WITH MEDIAL MENISCECTOMY (Right) 29881  FINDINGS:   Medial: Torn medial meniscus, radial tear back to the meniscosynovial junction with degenerative tears medial and lateral to it at the region of the body and posterior horn junction, grade 3 chondral lesion medial femoral condyle Lateral: Normal lateral meniscus normal lateral articular cartilage Patellofemoral joint: Small grade 3 chondral lesion central to slightly lateral facet patella with grade 3 diffuse lesion trochlea Notch: Anterior and posterior cruciate ligaments were normal Suprapatellar region: Synovitis and inflammation mild  SURGEON:  Surgeon(s) and Role:    * Ra Pfiester E, MD - Primary  The patient was seen in preop and the surgical site was confirmed, chart review and imaging review were completed and the surgical site was marked as right knee  He was then taken to the operating room placed in the supine position and general anesthesia was administered with a glide scope  The right leg was prepped and draped sterilely and the timeout was completed.  A lateral portal was made with an 11 blade the scope was placed into the joint.  Diagnostic arthroscopy was completed with a circumferential tour of the knee.  I listed the findings above.  With the assistance of a spinal needle I made a medial portal.  Lateral post was placed.  Arthroscopic biters were used to resect the torn meniscus.  The meniscal fragments were removed with a motorized shaver and then the meniscus was balanced with a 50 degree ArthroCare wand  The medial femoral condyle was evaluated for possible microfracture but the lesion was  diffuse and no chondroplasty was performed to preserve remaining cartilage  The joint was irrigated and then suctioned dry and then injected with Marcaine with epinephrine.  3 sutures were placed one lateral to medial and then sterile dressing followed by Ace bandage Cryo/Cuff which was activated  Patient was extubated taken to recovery room in stable condition  PHYSICIAN ASSISTANT:   ASSISTANTS: none   ANESTHESIA:   general  EBL:  0 mL   BLOOD ADMINISTERED:none  DRAINS: none   LOCAL MEDICATIONS USED:  MARCAINE     SPECIMEN:  No Specimen  DISPOSITION OF SPECIMEN:  N/A  COUNTS:  YES  TOURNIQUET:  * Missing tourniquet times found for documented tourniquets in log: [id number] *  DICTATION: .Dragon Dictation  PLAN OF CARE: Discharge to home after PACU  PATIENT DISPOSITION:  PACU - hemodynamically stable.   Delay start of Pharmacological VTE agent (>24hrs) due to surgical blood loss or risk of bleeding: not applicable  

## 2019-08-28 NOTE — Anesthesia Preprocedure Evaluation (Addendum)
Anesthesia Evaluation  Patient identified by MRN, date of birth, ID band Patient awake    Reviewed: Allergy & Precautions, NPO status , Patient's Chart, lab work & pertinent test results  Airway Mallampati: III  TM Distance: >3 FB Neck ROM: Full    Dental  (+) Dental Advisory Given,    Pulmonary sleep apnea and Continuous Positive Airway Pressure Ventilation ,    Pulmonary exam normal breath sounds clear to auscultation       Cardiovascular Exercise Tolerance: Good hypertension, Pt. on medications  Rhythm:Regular Rate:Normal  27-Aug-2019 08:47:08 [facility]300 ROUTINE RECORD Normal sinus rhythm Right axis deviation Incomplete right bundle branch block Possible Right ventricular hypertrophy Cannot rule out Anterior infarct , age undetermined Abnormal ECG   Neuro/Psych negative neurological ROS  negative psych ROS   GI/Hepatic negative GI ROS, Neg liver ROS,   Endo/Other  Morbid obesity  Renal/GU negative Renal ROS  negative genitourinary   Musculoskeletal Right knee pain   Abdominal   Peds negative pediatric ROS (+)  Hematology negative hematology ROS (+)   Anesthesia Other Findings   Reproductive/Obstetrics negative OB ROS                          Anesthesia Physical Anesthesia Plan  ASA: III  Anesthesia Plan: General   Post-op Pain Management:    Induction: Intravenous  PONV Risk Score and Plan: 3 and Midazolam, Ondansetron and Dexamethasone  Airway Management Planned: Oral ETT  Additional Equipment:   Intra-op Plan:   Post-operative Plan: Extubation in OR  Informed Consent: I have reviewed the patients History and Physical, chart, labs and discussed the procedure including the risks, benefits and alternatives for the proposed anesthesia with the patient or authorized representative who has indicated his/her understanding and acceptance.     Dental  advisory given  Plan Discussed with: CRNA and Surgeon  Anesthesia Plan Comments:        Anesthesia Quick Evaluation

## 2019-08-28 NOTE — Op Note (Signed)
08/28/2019  8:22 AM  PATIENT:  Kenneth Bridges  54 y.o. 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  PROCEDURE:  Procedure(s): KNEE ARTHROSCOPY WITH MEDIAL MENISCECTOMY (Right) 29881  FINDINGS:   Medial: Torn medial meniscus, radial tear back to the meniscosynovial junction with degenerative tears medial and lateral to it at the region of the body and posterior horn junction, grade 3 chondral lesion medial femoral condyle Lateral: Normal lateral meniscus normal lateral articular cartilage Patellofemoral joint: Small grade 3 chondral lesion central to slightly lateral facet patella with grade 3 diffuse lesion trochlea Notch: Anterior and posterior cruciate ligaments were normal Suprapatellar region: Synovitis and inflammation mild  SURGEON:  Surgeon(s) and Role:    Vickki Hearing, MD - Primary  The patient was seen in preop and the surgical site was confirmed, chart review and imaging review were completed and the surgical site was marked as right knee  He was then taken to the operating room placed in the supine position and general anesthesia was administered with a glide scope  The right leg was prepped and draped sterilely and the timeout was completed.  A lateral portal was made with an 11 blade the scope was placed into the joint.  Diagnostic arthroscopy was completed with a circumferential tour of the knee.  I listed the findings above.  With the assistance of a spinal needle I made a medial portal.  Lateral post was placed.  Arthroscopic biters were used to resect the torn meniscus.  The meniscal fragments were removed with a motorized shaver and then the meniscus was balanced with a 50 degree ArthroCare wand  The medial femoral condyle was evaluated for possible microfracture but the lesion was  diffuse and no chondroplasty was performed to preserve remaining cartilage  The joint was irrigated and then suctioned dry and then injected with Marcaine with epinephrine.  3 sutures were placed one lateral to medial and then sterile dressing followed by Ace bandage Cryo/Cuff which was activated  Patient was extubated taken to recovery room in stable condition  PHYSICIAN ASSISTANT:   ASSISTANTS: none   ANESTHESIA:   general  EBL:  0 mL   BLOOD ADMINISTERED:none  DRAINS: none   LOCAL MEDICATIONS USED:  MARCAINE     SPECIMEN:  No Specimen  DISPOSITION OF SPECIMEN:  N/A  COUNTS:  YES  TOURNIQUET:  * Missing tourniquet times found for documented tourniquets in log: [id number] *  DICTATION: .Reubin Milan Dictation  PLAN OF CARE: Discharge to home after PACU  PATIENT DISPOSITION:  PACU - hemodynamically stable.   Delay start of Pharmacological VTE agent (>24hrs) due to surgical blood loss or risk of bleeding: not applicable

## 2019-08-28 NOTE — Interval H&P Note (Signed)
History and Physical Interval Note:  08/28/2019 7:23 AM  Kenneth Bridges  has presented today for surgery, with the diagnosis of Right knee medial meniscus tear osteochondral lesion medial femoral condyle.  The various methods of treatment have been discussed with the patient and family. After consideration of risks, benefits and other options for treatment, the patient has consented to  Procedure(s): KNEE ARTHROSCOPY WITH MEDIAL MENISCECTOMY POSSIBLE MICROFRACTURE (Right) as a surgical intervention.  The patient's history has been reviewed, patient examined, no change in status, stable for surgery.  I have reviewed the patient's chart and labs.  Questions were answered to the patient's satisfaction.     Fuller Canada

## 2019-08-28 NOTE — H&P (Signed)
Chief Complaint  Patient presents with  . Knee Pain      right   . Results      review MRI       Kenneth Bridges is 54 years old he presented to [place] with a 74-month history of pain and swelling in his right knee which has not gotten any better.  We sent him for MRI he comes back for discussion of his results and further treatment recommendations   Prior nonoperative treatment included 2 rounds of prednisone and 1 round of anti-inflammatories as well as activity modification   He continues to have a negative review of systems in terms of his overall medical health   He does have a history of hypertension nasal septal deviation sleep apnea and he does wear glasses        Past Surgical History:  Procedure Laterality Date  . ANKLE FRACTURE SURGERY Left 1988  . COLONOSCOPY N/A 05/19/2016    Procedure: COLONOSCOPY;  Surgeon: Corbin Ade, MD;  Location: [facility];  Service: Endoscopy;  Laterality: N/A;  9:30 am  . HERNIA REPAIR      . NASAL SEPTOPLASTY W/ TURBINOPLASTY   02/13/2016    NASAL SEPTOPLASTY WITH TURBINATE REDUCTION (Bilateral)  . NASAL SEPTOPLASTY W/ TURBINOPLASTY Bilateral 02/13/2016    Procedure: NASAL SEPTOPLASTY WITH TURBINATE REDUCTION;  Surgeon: Christia Reading, MD;  Location: [facility] OR;  Service: ENT;  Laterality: Bilateral;    Family History  Problem Relation Age of Onset  . Cancer Mother    . Colon cancer Neg Hx    . Colon polyps Neg Hx       Social History   Tobacco Use  . Smoking status: Never Smoker  . Smokeless tobacco: Never Used  Substance Use Topics  . Alcohol use: Yes    Comment: social beer drinker on the weekends (drinks 2 beers on Friday and 2 beers on Saturday)   . Drug use: No    No Known Allergies BP (!) 176/92   Pulse 68   Ht 5\' 11"  (1.803 m)   Wt 300 lb (136.1 kg)   BMI 41.84 kg/m    He is awake alert and oriented x3 mood and affect are normal   Appearance mesomorphic body habitus   Gait is normal  Left knee normal   Upper  extremities normal ROm stability and motor function with normal neurovascualr exam    As we noted previously his right knee was tender over the medial joint line where he continues to have pain he had a positive McMurray's effusion decreased range of motion including flexion extension strength and muscle tone were normal   I interpret his MRI as torn medial meniscus chondral lesion medial femoral condyle arthritis patellofemoral joint   Report was reviewed and also confirms is included by reference   Plan is now for elective surgery  DX Torn medial meniscus right knee with chondral defect    Plan Arthroscopy right knee partial medial meniscectomy possible microfracture

## 2019-09-04 DIAGNOSIS — Z9889 Other specified postprocedural states: Secondary | ICD-10-CM | POA: Insufficient documentation

## 2019-09-05 ENCOUNTER — Other Ambulatory Visit: Payer: Self-pay

## 2019-09-05 ENCOUNTER — Ambulatory Visit (INDEPENDENT_AMBULATORY_CARE_PROVIDER_SITE_OTHER): Payer: BLUE CROSS/BLUE SHIELD | Admitting: Orthopedic Surgery

## 2019-09-05 ENCOUNTER — Encounter: Payer: Self-pay | Admitting: Orthopedic Surgery

## 2019-09-05 VITALS — BP 142/90 | HR 74 | Temp 97.0°F | Ht 71.0 in | Wt 300.0 lb

## 2019-09-05 DIAGNOSIS — Z9889 Other specified postprocedural states: Secondary | ICD-10-CM

## 2019-09-05 NOTE — Progress Notes (Signed)
Chief Complaint  Patient presents with  . Follow-up    Recheck on right knee, DOS 08-28-19.   Encounter Diagnosis  Name Primary?  . S/P right knee arthroscopy1/19/21 Yes   Postop visit #1 8 days after knee arthroscopy   Kenneth Bridges is doing great he is off all opioid medication is using a cane just as needed he says he really does not need it  Is knee looks good he has little swelling the sutures were removed his knee flexion is excellent  He will continue with additional knee exercises, cane as needed, he will communicate with me through MyChart next week to see if he can go back to work with perhaps a knee sleeve  Otherwise follow-up in 2 weeks  08/28/2019   8:22 AM   PATIENT:  Kenneth Bridges  54 y.o. male   PRE-OPERATIVE DIAGNOSIS:  Right knee medial meniscus tear osteochondral lesion medial femoral condyle   POST-OPERATIVE DIAGNOSIS:  Right knee medial meniscus tear osteochondral lesion medial femoral condyle   PROCEDURE:  Procedure(s): KNEE ARTHROSCOPY WITH MEDIAL MENISCECTOMY (Right) 29881   FINDINGS:    Medial: Torn medial meniscus, radial tear back to the meniscosynovial junction with degenerative tears medial and lateral to it at the region of the body and posterior horn junction, grade 3 chondral lesion medial femoral condyle Lateral: Normal lateral meniscus normal lateral articular cartilage Patellofemoral joint: Small grade 3 chondral lesion central to slightly lateral facet patella with grade 3 diffuse lesion trochlea Notch: Anterior and posterior cruciate ligaments were normal Suprapatellar region: Synovitis and inflammation mild

## 2019-09-05 NOTE — Patient Instructions (Signed)
Ice continue   New exercises 2 x a day   Cane as needed   F/u 2 weeks

## 2019-09-19 ENCOUNTER — Encounter: Payer: Self-pay | Admitting: Orthopedic Surgery

## 2019-09-19 ENCOUNTER — Other Ambulatory Visit: Payer: Self-pay

## 2019-09-19 ENCOUNTER — Ambulatory Visit (INDEPENDENT_AMBULATORY_CARE_PROVIDER_SITE_OTHER): Payer: BLUE CROSS/BLUE SHIELD | Admitting: Orthopedic Surgery

## 2019-09-19 VITALS — Temp 97.2°F | Ht 71.0 in | Wt 312.0 lb

## 2019-09-19 DIAGNOSIS — Z9889 Other specified postprocedural states: Secondary | ICD-10-CM

## 2019-09-19 NOTE — Patient Instructions (Signed)
The knee has responded well to the surgery   For pain: use tylenol 500 mg every 6 hrs as needed and / or advil 800 mg every 8 hrs as needed and don"t forget to use ice if you see swelling

## 2019-09-19 NOTE — Progress Notes (Signed)
Chief Complaint  Patient presents with  . Knee Pain    R/ doing good went back to work Monday some pain from walking DOI 08/28/19    54 year old male had surgery on January 19 he had an arthroscopy of the right knee.  He had a medial meniscectomy and an osteochondral lesion of the medial femoral condyle with a grade 3 lesion on the lateral patellar facet  He is doing well with some mild pain when walking and he can climb 2 steps sequentially  He has full extension no effusion there is no sign of infection his quadriceps function is good he can bend his knee all the way he walks without a limp  He can return to exercising starting March 1.  He can go back to work  He can use Tylenol or Advil for pain if that does not work or if becomes excessive he can come back for a follow-up appointment otherwise he is released  Encounter Diagnosis  Name Primary?  . S/P right knee arthroscopy1/19/21 Yes

## 2019-10-09 DIAGNOSIS — L02213 Cutaneous abscess of chest wall: Secondary | ICD-10-CM | POA: Diagnosis not present

## 2019-10-11 ENCOUNTER — Ambulatory Visit: Payer: BLUE CROSS/BLUE SHIELD | Attending: Internal Medicine

## 2019-10-11 DIAGNOSIS — Z23 Encounter for immunization: Secondary | ICD-10-CM | POA: Insufficient documentation

## 2019-10-11 NOTE — Progress Notes (Signed)
   Covid-19 Vaccination Clinic  Name:  Kenneth Bridges    MRN: [id number] DOB: [birth year]  10/11/2019  [name] was observed post Covid-19 immunization for 15 minutes without incident. He was provided with Vaccine Information Sheet and instruction to access the V-Safe system.   [name] was instructed to call 911 with any severe reactions post vaccine: Marland Kitchen Difficulty breathing  . Swelling of face and throat  . A fast heartbeat  . A bad rash all over body  . Dizziness and weakness   Immunizations Administered    Name Date Dose VIS Date Route   Moderna COVID-19 Vaccine 10/11/2019 11:23 AM 0.5 mL 07/10/2019 Intramuscular   Manufacturer: Moderna   Lot: [id number]   NDC: 59539-672-89

## 2019-11-13 ENCOUNTER — Ambulatory Visit: Payer: BLUE CROSS/BLUE SHIELD | Attending: Internal Medicine

## 2019-11-13 DIAGNOSIS — Z23 Encounter for immunization: Secondary | ICD-10-CM

## 2019-11-13 NOTE — Progress Notes (Signed)
   Covid-19 Vaccination Clinic  Name:  MONNIE ANSPACH    MRN: [id number] DOB: September 14, 1965  11/13/2019  Mr. Lycan was observed post Covid-19 immunization for 15 minutes without incident. He was provided with Vaccine Information Sheet and instruction to access the V-Safe system.   Mr. Sciandra was instructed to call 911 with any severe reactions post vaccine: Marland Kitchen Difficulty breathing  . Swelling of face and throat  . A fast heartbeat  . A bad rash all over body  . Dizziness and weakness   Immunizations Administered    Name Date Dose VIS Date Route   Moderna COVID-19 Vaccine 11/13/2019  8:55 AM 0.5 mL 07/10/2019 Intramuscular   Manufacturer: Moderna   Lot: [id number]   NDC: 66815-947-07

## 2020-06-04 DIAGNOSIS — Z23 Encounter for immunization: Secondary | ICD-10-CM | POA: Diagnosis not present

## 2020-06-11 DIAGNOSIS — G4733 Obstructive sleep apnea (adult) (pediatric): Secondary | ICD-10-CM | POA: Diagnosis not present

## 2020-06-11 DIAGNOSIS — J329 Chronic sinusitis, unspecified: Secondary | ICD-10-CM | POA: Diagnosis not present

## 2020-06-11 DIAGNOSIS — M25562 Pain in left knee: Secondary | ICD-10-CM | POA: Diagnosis not present

## 2020-07-17 DIAGNOSIS — Z8522 Personal history of malignant neoplasm of nasal cavities, middle ear, and accessory sinuses: Secondary | ICD-10-CM | POA: Diagnosis not present

## 2020-07-17 DIAGNOSIS — Z6841 Body Mass Index (BMI) 40.0 and over, adult: Secondary | ICD-10-CM | POA: Diagnosis not present

## 2020-07-17 DIAGNOSIS — E782 Mixed hyperlipidemia: Secondary | ICD-10-CM | POA: Diagnosis not present

## 2020-07-17 DIAGNOSIS — R7301 Impaired fasting glucose: Secondary | ICD-10-CM | POA: Diagnosis not present

## 2020-07-17 DIAGNOSIS — J060 Acute laryngopharyngitis: Secondary | ICD-10-CM | POA: Diagnosis not present

## 2020-07-17 DIAGNOSIS — E1169 Type 2 diabetes mellitus with other specified complication: Secondary | ICD-10-CM | POA: Diagnosis not present

## 2020-07-17 DIAGNOSIS — I10 Essential (primary) hypertension: Secondary | ICD-10-CM | POA: Diagnosis not present

## 2020-07-17 DIAGNOSIS — R509 Fever, unspecified: Secondary | ICD-10-CM | POA: Diagnosis not present

## 2020-07-23 DIAGNOSIS — I10 Essential (primary) hypertension: Secondary | ICD-10-CM | POA: Diagnosis not present

## 2020-07-23 DIAGNOSIS — E1169 Type 2 diabetes mellitus with other specified complication: Secondary | ICD-10-CM | POA: Diagnosis not present

## 2020-07-23 DIAGNOSIS — E782 Mixed hyperlipidemia: Secondary | ICD-10-CM | POA: Diagnosis not present

## 2020-07-23 DIAGNOSIS — E291 Testicular hypofunction: Secondary | ICD-10-CM | POA: Diagnosis not present

## 2020-10-25 ENCOUNTER — Other Ambulatory Visit: Payer: Self-pay

## 2020-10-25 ENCOUNTER — Encounter: Payer: Self-pay | Admitting: Emergency Medicine

## 2020-10-25 ENCOUNTER — Ambulatory Visit (INDEPENDENT_AMBULATORY_CARE_PROVIDER_SITE_OTHER): Payer: BC Managed Care – PPO

## 2020-10-25 ENCOUNTER — Ambulatory Visit
Admission: EM | Admit: 2020-10-25 | Discharge: 2020-10-25 | Disposition: A | Discharge status: Home / Self Care | Payer: BC Managed Care – PPO

## 2020-10-25 DIAGNOSIS — M7989 Other specified soft tissue disorders: Secondary | ICD-10-CM | POA: Diagnosis not present

## 2020-10-25 DIAGNOSIS — S99912A Unspecified injury of left ankle, initial encounter: Secondary | ICD-10-CM

## 2020-10-25 DIAGNOSIS — M25572 Pain in left ankle and joints of left foot: Secondary | ICD-10-CM

## 2020-10-25 DIAGNOSIS — S82302A Unspecified fracture of lower end of left tibia, initial encounter for closed fracture: Secondary | ICD-10-CM | POA: Diagnosis not present

## 2020-10-25 HISTORY — DX: Type 2 diabetes mellitus without complications: E11.9

## 2020-10-25 MED ORDER — TRAMADOL HCL 50 MG PO TABS: 50.0000 mg | ORAL_TABLET | Freq: Four times a day (QID) | ORAL | 0 refills | Status: DC | PRN

## 2020-10-25 NOTE — ED Provider Notes (Signed)
[facility]   [id number] 10/25/20 Arrival Time: 4098  CC: LT ankle pain  SUBJECTIVE: History from: patient. Kenneth Bridges is a 55 y.o. male complains of LT ankle pain and injury that occurred last night.  Fell after dogs ran into him.  Inverted ankle.  Localizes the pain to the inside of ankle.  Describes the pain as intermittent and "spasm" in character.  Has tried norco without relief.  Symptoms are made worse with weight bearing  Reports previous ankle sprain.  Complains of swelling and bruising.  Denies fever, chills, erythema.  ROS: As per HPI.  All other pertinent ROS negative.     Past Medical History:  Diagnosis Date  . Diabetes mellitus without complication (HCC)   . Hypertension   . 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 Sleep apnea   . Wears glasses    Past Surgical History:  Procedure Laterality Date  . ANKLE FRACTURE SURGERY Left 1988  . COLONOSCOPY N/A 05/19/2016   Procedure: COLONOSCOPY;  Surgeon: Corbin Ade, MD;  Location: [facility];  Service: Endoscopy;  Laterality: N/A;  9:30 am  . HERNIA REPAIR Left    inguinal  . KNEE ARTHROSCOPY WITH MEDIAL MENISECTOMY Right 08/28/2019   Procedure: KNEE ARTHROSCOPY WITH MEDIAL MENISCECTOMY;  Surgeon: Vickki Hearing, MD;  Location: [facility];  Service: Orthopedics;  Laterality: Right;  . NASAL SEPTOPLASTY W/ TURBINOPLASTY  02/13/2016   NASAL SEPTOPLASTY WITH TURBINATE REDUCTION (Bilateral)  . NASAL SEPTOPLASTY W/ TURBINOPLASTY Bilateral 02/13/2016   Procedure: NASAL SEPTOPLASTY WITH TURBINATE REDUCTION;  Surgeon: Christia Reading, MD;  Location: [facility] OR;  Service: ENT;  Laterality: Bilateral;   No Known Allergies No current facility-administered medications on file prior to encounter.   Current Outpatient Medications on File Prior to Encounter  Medication Sig Dispense Refill  . diclofenac (VOLTAREN) 25 MG EC tablet Take 25 mg by mouth once.    . metFORMIN (GLUCOPHAGE) 500 MG  tablet Take by mouth 2 (two) times daily with a meal.    . ibuprofen (ADVIL) 800 MG tablet Take 1 tablet (800 mg total) by mouth every 8 (eight) hours as needed. 30 tablet 0  . Multiple Vitamin (MULTIVITAMIN WITH MINERALS) TABS tablet Take 1 tablet by mouth daily.    . promethazine (PHENERGAN) 12.5 MG tablet Take 1 tablet (12.5 mg total) by mouth every 6 (six) hours as needed for nausea or vomiting. (Patient not taking: Reported on 09/05/2019) 30 tablet 0  . telmisartan-hydrochlorothiazide (MICARDIS HCT) 80-12.5 MG tablet Take 1 tablet by mouth daily.     Social History   Socioeconomic History  . Marital status: Married    Spouse name: Not on file  . Number of children: Not on file  . Years of education: Not on file  . Highest education level: Not on file  Occupational History  . Not on file  Tobacco Use  . Smoking status: Never Smoker  . Smokeless tobacco: Never Used  Vaping Use  . Vaping Use: Never used  Substance and Sexual Activity  . Alcohol use: Yes    Comment: social beer drinker on the weekends (drinks 2 beers on Friday and 2 beers on Saturday)   . Drug use: No  . Sexual activity: Not on file  Other Topics Concern  . Not on file  Social History Narrative  . Not on file   Social Determinants of Health   Financial Resource Strain: Not on file  Food Insecurity: Not  on file  Transportation Needs: Not on file  Physical Activity: Not on file  Stress: Not on file  Social Connections: Not on file  Intimate Partner Violence: Not on file   Family History  Problem Relation Age of Onset  . Cancer Mother   . Colon cancer Neg Hx   . Colon polyps Neg Hx     OBJECTIVE:  Vitals:   10/25/20 0813  BP: (S) (!) 172/96  Pulse: 78  Resp: 18  Temp: 97.8 F (36.6 C)  TempSrc: Oral  SpO2: 95%    General appearance: ALERT; in no acute distress.  Head: NCAT Lungs: Normal respiratory effort CV: Dorsalis pedis pulse 2+ Musculoskeletal: LT ankle  Inspection: Swelling and  yellow ecchymosis to medial ankle Palpation: TTP over distal tibia/ lateral ankle ROM: deferred Strength: decreased strength with plantar flexion Skin: warm and dry Neurologic: Sitting in wheelchair; Sensation intact about the lower extremities Psychological: alert and cooperative; normal mood and affect  DIAGNOSTIC STUDIES:  DG Ankle Complete Left  Addendum Date: 10/25/2020   ADDENDUM REPORT: 10/25/2020 09:02 ADDENDUM: There is a small subtle lucency in the posterior distal tibia, if the patient has focal pain here, fracture is not excluded. Electronically Signed   By: Sherian Rein M.D.   On: 10/25/2020 09:02   Result Date: 10/25/2020 CLINICAL DATA:  Status post fall last night with left ankle swelling. EXAM: LEFT ANKLE COMPLETE - 3+ VIEW COMPARISON:  None. FINDINGS: There is no evidence of fracture, dislocation, or joint effusion. Small plantar calcaneal spur is noted. Generalized soft tissue swelling around the ankle is noted. IMPRESSION: No acute fracture or dislocation. Generalized soft tissue swelling around the ankle. Electronically Signed: By: Sherian Rein M.D. On: 10/25/2020 08:48     ASSESSMENT & PLAN:  1. Acute left ankle pain   2. Injury of left ankle, initial encounter   3. Closed fracture of distal end of left tibia, unspecified fracture morphology, initial encounter    Meds ordered this encounter  Medications  . traMADol (ULTRAM) 50 MG tablet    Sig: Take 1 tablet (50 mg total) by mouth every 6 (six) hours as needed.    Dispense:  15 tablet    Refill:  0    Order Specific Question:   Supervising Provider    Answer:   Eustace Moore [[id number]]   X-rays concerning for fracture.  Per radiologist "There is a small subtle lucency in the posterior distal tibia, if the patient has focal pain here, fracture is not excluded." Continue conservative management of rest, ice, and elevation Cam walker applied; continue with crutches Remain non-weight-bearing Alternate  ibuprofen and tylenol Tramadol for severe break-through pain.   Follow up with orthopedist for further evaluation and management Return or go to the ER if you have any new or worsening symptoms (fever, chills, chest pain, increase swelling, redness, numbness/ tingling, coolness in extremity, blue discoloration of limb, etc...)   Reviewed expectations re: course of current medical issues. Questions answered. Outlined signs and symptoms indicating need for more acute intervention. Patient verbalized understanding. After Visit Summary given.    Rennis Harding, PA-C 10/25/20 [phone]

## 2020-10-25 NOTE — ED Triage Notes (Signed)
Fell yesterday. Left ankle pain.  Ankle swollen

## 2020-10-25 NOTE — Discharge Instructions (Signed)
X-rays concerning for fracture.  Per radiologist "There is a small subtle lucency in the posterior distal tibia, if the patient has focal pain here, fracture is not excluded." Continue conservative management of rest, ice, and elevation Cam walker applied; continue with crutches Remain non-weight-bearing Alternate ibuprofen and tylenol Tramadol for severe break-through pain.   Follow up with orthopedist for further evaluation and management Return or go to the ER if you have any new or worsening symptoms (fever, chills, chest pain, increase swelling, redness, numbness/ tingling, coolness in extremity, blue discoloration of limb, etc...)

## 2020-10-27 ENCOUNTER — Other Ambulatory Visit: Payer: Self-pay

## 2020-10-27 ENCOUNTER — Ambulatory Visit (INDEPENDENT_AMBULATORY_CARE_PROVIDER_SITE_OTHER): Payer: BC Managed Care – PPO | Admitting: Orthopedic Surgery

## 2020-10-27 ENCOUNTER — Encounter: Payer: Self-pay | Admitting: Orthopedic Surgery

## 2020-10-27 ENCOUNTER — Ambulatory Visit: Payer: BC Managed Care – PPO

## 2020-10-27 VITALS — BP 168/106 | HR 71 | Ht 70.0 in | Wt 310.0 lb

## 2020-10-27 DIAGNOSIS — M25572 Pain in left ankle and joints of left foot: Secondary | ICD-10-CM

## 2020-10-27 NOTE — Patient Instructions (Signed)
Come back thurs if MRI done tue or weds

## 2020-10-27 NOTE — Progress Notes (Signed)
Kenneth Bridges  10/27/2020  Body mass index is 44.48 kg/m.  ASSESSMENT AND PLAN:     55 year old male acute trauma to the left ankle.  He has medial pain over the deltoid with a large bruise and then has syndesmosis pain without a fracture on x-ray  MRI needed to define the possible syndesmosis injury which then would require surgery   Encounter Diagnosis  Name Primary?  . Acute left ankle pain Yes    Imaging Imaging from the primary place of initial evaluation shows widening of the ankle mortise I repeated the films including a tib-fib do not see a fracture but the ankle mortise is widened medially   HISTORY SECTION :  Chief Complaint  Patient presents with  . Foot Pain    Left foot, Patient reports that the boot is helping.    HPI The patient presents for evaluation of moderate to severe left ankle pain medial side with some syndesmosis pain is well no proximal fibular pain associated with an injury dated  March 18   Review of Systems  Constitutional: Negative for chills and fever.  Respiratory: Negative for shortness of breath.   Cardiovascular: Negative for chest pain.     has a past medical history of Diabetes mellitus without complication (HCC), Hypertension, Nasal septal deviation, Sleep apnea, and Wears glasses.   Past Surgical History:  Procedure Laterality Date  . ANKLE FRACTURE SURGERY Left 1988  . COLONOSCOPY N/A 05/19/2016   Procedure: COLONOSCOPY;  Surgeon: Corbin Ade, MD;  Location: [facility];  Service: Endoscopy;  Laterality: N/A;  9:30 am  . HERNIA REPAIR Left    inguinal  . KNEE ARTHROSCOPY WITH MEDIAL MENISECTOMY Right 08/28/2019   Procedure: KNEE ARTHROSCOPY WITH MEDIAL MENISCECTOMY;  Surgeon: Vickki Hearing, MD;  Location: [facility];  Service: Orthopedics;  Laterality: Right;  . NASAL SEPTOPLASTY W/ TURBINOPLASTY  02/13/2016   NASAL SEPTOPLASTY WITH TURBINATE REDUCTION (Bilateral)  . NASAL SEPTOPLASTY W/ TURBINOPLASTY Bilateral  02/13/2016   Procedure: NASAL SEPTOPLASTY WITH TURBINATE REDUCTION;  Surgeon: Christia Reading, MD;  Location: [facility] OR;  Service: ENT;  Laterality: Bilateral;    Social History   Tobacco Use  . Smoking status: Never Smoker  . Smokeless tobacco: Never Used  Vaping Use  . Vaping Use: Never used  Substance Use Topics  . Alcohol use: Yes    Comment: social beer drinker on the weekends (drinks 2 beers on Friday and 2 beers on Saturday)   . Drug use: No    Family History  Problem Relation Age of Onset  . Cancer Mother   . Colon cancer Neg Hx   . Colon polyps Neg Hx       No Known Allergies   Current Outpatient Medications:  .  diclofenac (VOLTAREN) 25 MG EC tablet, Take 25 mg by mouth once., Disp: , Rfl:  .  metFORMIN (GLUCOPHAGE) 500 MG tablet, Take by mouth 2 (two) times daily with a meal., Disp: , Rfl:  .  Multiple Vitamin (MULTIVITAMIN WITH MINERALS) TABS tablet, Take 1 tablet by mouth daily., Disp: , Rfl:  .  telmisartan-hydrochlorothiazide (MICARDIS HCT) 80-12.5 MG tablet, Take 1 tablet by mouth daily., Disp: , Rfl:  .  traMADol (ULTRAM) 50 MG tablet, Take 1 tablet (50 mg total) by mouth every 6 (six) hours as needed., Disp: 15 tablet, Rfl: 0   PHYSICAL EXAM SECTION: BP (!) 168/106   Pulse 71   Ht 5\' 10"  (1.778 m)   Wt (!) 310 lb (  140.6 kg) Comment: In boot per patient  BMI 44.48 kg/m   Body mass index is 44.48 kg/m.   General appearance: Well-developed well-nourished no gross deformities  Eyes clear normal vision no evidence of conjunctivitis or jaundice, extraocular muscles intact  ENT: ears hearing normal, nasal passages clear, throat clear   Lymph nodes: No lymphadenopathy  Neck is supple without palpable mass, full range of motion  Cardiovascular normal pulse and perfusion in all 4 extremities normal color without edema  Neurologically deep tendon reflexes are equal and normal, no sensation loss or deficits no pathologic reflexes  Psychological: Awake alert  and oriented x3 mood and affect normal  Skin no lacerations or ulcerations no nodularity no palpable masses, no erythema or nodularity  Musculoskeletal: He has a cam walker on his injured left ankle He is tender and bruised on the medial side of the foot is swollen and is tender in the syndesmosis no fibular tenderness throughout the course  Skin is intact no blisters  2:46 PM

## 2020-10-28 ENCOUNTER — Telehealth: Payer: Self-pay | Admitting: Radiology

## 2020-10-28 NOTE — Telephone Encounter (Signed)
MRI next Friday I don't think we can get this any sooner, what do you think?

## 2020-10-28 NOTE — Telephone Encounter (Signed)
Pt has one of the soonest dates possible. We could attempt but if we get facility changed with insurance it will most likely take longer to get him scheduled.

## 2020-10-28 NOTE — Telephone Encounter (Signed)
-----   Message from Doristine Section sent at 10/28/2020 12:00 PM EDT ----- Regarding: About MRI date [facility]" Zajkowski #[id number] relays that his MRI is scheduled for 11/07/20, and Dr Romeo Apple asked for an ASAP MRI, and fol/up this Thursday, 10/30/20. Patient asking if he may have MRI at another location sooner?

## 2020-10-28 NOTE — Telephone Encounter (Signed)
I called him moved follow up appointment

## 2020-10-29 ENCOUNTER — Encounter: Payer: Self-pay | Admitting: Orthopedic Surgery

## 2020-10-30 ENCOUNTER — Ambulatory Visit: Payer: BC Managed Care – PPO | Admitting: Orthopedic Surgery

## 2020-10-31 ENCOUNTER — Ambulatory Visit (HOSPITAL_COMMUNITY)
Admission: RE | Admit: 2020-10-31 | Discharge: 2020-10-31 | Disposition: A | Discharge status: Home / Self Care | Payer: BC Managed Care – PPO | Source: Ambulatory Visit | Attending: Orthopedic Surgery | Admitting: Orthopedic Surgery

## 2020-10-31 DIAGNOSIS — S9002XA Contusion of left ankle, initial encounter: Secondary | ICD-10-CM | POA: Diagnosis not present

## 2020-10-31 DIAGNOSIS — M25572 Pain in left ankle and joints of left foot: Secondary | ICD-10-CM | POA: Diagnosis not present

## 2020-10-31 DIAGNOSIS — S93432A Sprain of tibiofibular ligament of left ankle, initial encounter: Secondary | ICD-10-CM | POA: Diagnosis not present

## 2020-10-31 DIAGNOSIS — M25472 Effusion, left ankle: Secondary | ICD-10-CM | POA: Diagnosis not present

## 2020-10-31 DIAGNOSIS — S93422A Sprain of deltoid ligament of left ankle, initial encounter: Secondary | ICD-10-CM | POA: Diagnosis not present

## 2020-11-03 ENCOUNTER — Encounter: Payer: Self-pay | Admitting: Orthopedic Surgery

## 2020-11-03 ENCOUNTER — Ambulatory Visit (INDEPENDENT_AMBULATORY_CARE_PROVIDER_SITE_OTHER): Payer: BC Managed Care – PPO | Admitting: Orthopedic Surgery

## 2020-11-03 ENCOUNTER — Other Ambulatory Visit: Payer: Self-pay

## 2020-11-03 VITALS — BP 184/112 | HR 79 | Ht 70.0 in | Wt 316.0 lb

## 2020-11-03 DIAGNOSIS — M25572 Pain in left ankle and joints of left foot: Secondary | ICD-10-CM

## 2020-11-03 DIAGNOSIS — S93422A Sprain of deltoid ligament of left ankle, initial encounter: Secondary | ICD-10-CM | POA: Diagnosis not present

## 2020-11-03 DIAGNOSIS — S93432D Sprain of tibiofibular ligament of left ankle, subsequent encounter: Secondary | ICD-10-CM

## 2020-11-03 MED ORDER — TRAMADOL HCL 50 MG PO TABS: 50.0000 mg | ORAL_TABLET | Freq: Four times a day (QID) | ORAL | 0 refills | Status: DC | PRN

## 2020-11-03 NOTE — Progress Notes (Signed)
Chief Complaint  Patient presents with  . Ankle Pain    L/ here to go over MRI. Still having pain its about a 4/5 when sitting still, but at night in bed if move the wrong way its much higher   55 year old male injured his left ankle on March 18 had an unusual syndesmosis injury with medial clear space widening sent for MRI he has an anterior tib-fib ligament tear and a deltoid tear with ankle mortise widening  I reviewed his MRI agree with the report my interpretation is that this is a syndesmosis injury with the ligament injuries as stated  Recommend tight rope repair open medial repair  The procedure has been fully reviewed with the patient; The risks and benefits of surgery have been discussed and explained and understood. Alternative treatment has also been reviewed, questions were encouraged and answered. The postoperative plan is also been reviewed.  Encounter Diagnoses  Name Primary?  . Acute left ankle pain Yes  . Sprain of deltoid ligament of left ankle, initial encounter   . Syndesmotic disruption of left ankle, subsequent encounter

## 2020-11-04 ENCOUNTER — Other Ambulatory Visit: Payer: Self-pay | Admitting: Orthopedic Surgery

## 2020-11-04 NOTE — Patient Instructions (Signed)
Ankle      Kenneth Bridges  11/04/2020     @PREFPERIOPPHARMACY @   Your procedure is scheduled on  11/07/2020   Report to [facility] at  0700  A.M.   Call this number if you have problems the morning of surgery:  [phone]   Remember:  Do not eat or drink after midnight.                        Take these medicines the morning of surgery with A SIP OF WATER Voltaren or tramadol (if needed).  DO NOT take any medications for diabetes the morning of your procedure.  If your glucose is 70 or below the morning of your procedure, drink 1/2 cup of clear juice and recheck your glucose in 15 mnutes. If your glucose is still 70 or below, call [phone] for inatructions.  If your glucose is 300 or above the morning of your procedure, call [phone] for instructions.      Do not wear jewelry, make-up or nail polish.  Do not wear lotions, powders, or perfumes, or deodorant.  Do not shave 48 hours prior to surgery.  Men may shave face and neck.  Do not bring valuables to the hospital.  [facility] is not responsible for any belongings or valuables.   Contacts, dentures or bridgework may not be worn into surgery.  Leave your suitcase in the car.  After surgery it may be brought to your room.  For patients admitted to the hospital, discharge time will be determined by your treatment team.  Patients discharged the day of surgery will not be allowed to drive home and must have someone with them for 24 hours.  Place clean sheets on your bed the night before your procedure and DO NOT sleep with pets this night.  Shower with CHG the night before and the morning of your procedure. DO NOT put CHG on your face, hair or genitals.  After each shower, dry off with a clean towel, put on clean, comfortable clothes and brush your teeth.   Special instructions:  DO NOT smoke tobacco or vape the morning of your procedure.   Please read over the following fact sheets that you  were given. Coughing and Deep Breathing, Surgical Site Infection Prevention, Anesthesia Post-op Instructions and Care and Recovery After Surgery       Care After This sheet gives you information about how to care for yourself after your procedure. Your health care provider may also give you more specific instructions. If you have problems or questions, contact your health care provider. What can I expect after the procedure? After your procedure, it is common to have:  Swelling, bruising, and pain at your ankle and foot.  Some clear or light yellow drainage or a small amount of blood from your incision.  Stiffness when you move your ankle, once you are allowed to move it. You will not be able to put any weight on your injured leg. Follow these instructions at home: Medicines  Take over-the-counter and prescription medicines only as told by your health care provider.  If you were given a sedative during the procedure, it can affect you for several hours. Do not drive or operate machinery until your health care provider says that it is safe.  Your medicines may cause constipation. To prevent or treat constipation, you may need to: ? Drink enough fluid to keep your urine pale yellow. ?  Take over-the-counter or prescription medicines. ? Eat foods that are high in fiber, such as beans, whole grains, and fresh fruits and vegetables. ? Limit foods that are high in fat and processed sugars, such as fried or sweet foods. If you have a splint or boot:  Wear the splint or boot as told by your health care provider. Remove it only as told by your health care provider.  Loosen the splint or boot if your toes tingle, become numb, or turn cold and blue.  Keep the splint or boot clean and dry. If you have a cast:  Do not stick anything inside the cast to scratch your skin. Doing that increases your risk of infection.  Check the skin around the cast every day. Tell your health care provider  about any concerns.  You may put lotion on dry skin around the edges of the cast. Do not put lotion on the skin underneath the cast.  Keep the cast clean and dry. Bathing  Do not take baths, swim, or use a hot tub until your health care provider approves. Ask your health care provider if you can take showers. You may only be allowed to take sponge baths.  If the splint, boot, or cast is not waterproof: ? Do not let it get wet. ? Cover it with a watertight covering when you take a bath or a shower.  Keep the bandage (dressing) dry until your health care provider says it can be removed. Incision care  Follow instructions from your health care provider about how to take care of your incision. Make sure you: ? Wash your hands with soap and water for at least 20 seconds before and after you change your dressing. If soap and water are not available, use hand sanitizer. ? Change your dressing as told by your health care provider. It is normal to have some clear or pale yellow drainage or small amounts of blood on the dressing in the first few days after surgery. ? Leave stitches (sutures), skin glue, or adhesive strips in place. These skin closures may need to stay in place for 2 weeks or longer. If adhesive strip edges start to loosen and curl up, you may trim the loose edges. Do not remove adhesive strips completely unless your health care provider tells you to do that.  Check your incision area every day for signs of infection. Check for: ? More redness, swelling, or pain. ? More fluid or blood. If you have a cast, the blood or fluid may come through as a stain on the cast. ? Warmth. ? Pus or a bad smell.   Managing pain, stiffness, and swelling  If directed, put ice on the injured area. To do this: ? If you have a removable splint or boot, remove it as told by your health care provider. ? Put ice in a plastic bag. ? Place a towel between your skin and the bag, or between the cast and the  bag. ? Leave the ice on for 20 minutes, 2-3 times a day. ? Remove the ice if your skin turns bright red. This is very important. If you cannot feel pain, heat, or cold, you have a greater risk of damage to the area.  Move your toes often to reduce stiffness and swelling.  Raise (elevate) the injured area above the level of your heart when you are sitting or lying down.   General instructions  If you have a cast, splint, or boot, ask your health  care provider when it is safe for you to drive.  Do not put pressure on any part of the cast or splint until it has fully hardened. This may take several hours.  Rest your injured ankle as told by your health care provider.  Do not use the injured limb to support your body weight until your health care provider says that you can. Use crutches, a walker, or a scooter as told by your health care provider.  Do not use any products that contain nicotine or tobacco, such as cigarettes, e-cigarettes, and chewing tobacco. These can delay bone healing. If you need help quitting, ask your health care provider.  Keep all follow-up visits. This is important. Contact a health care provider if:  You have more redness, swelling, or pain around your incision.  You have more fluid or blood coming from your incision.  Your incision feels warm to the touch.  You have pus or a bad smell coming from your incision.  You have a fever.  You have nausea or vomiting that does not go away. Get help right away if:  You have chest pain or shortness of breath.  You have numbness in your foot that is getting worse.  You have new pain or swelling in your calf. These symptoms may represent a serious problem that is an emergency. Do not wait to see if the symptoms will go away. Get medical help right away. Call your local emergency services (911 in the [place] Do not drive yourself to the hospital. Summary  After your procedure, it is common to have bruising,  swelling, and pain. You may also have some clear or light yellow drainage or blood coming from your incision.  Contact a health care provider if you notice more redness, swelling, pain, fluid, or blood at or around your incision.  Putting ice on the area, moving the toes, and raising the injured leg will help reduce pain and swelling.  Do not use the injured limb to support your body weight until your health care provider says that you can. Use crutches, a walker, or a scooter as told by your health care provider. This information is not intended to replace advice given to you by your health care provider. Make sure you discuss any questions you have with your health care provider. Document Revised: 11/12/2019 Document Reviewed: 11/12/2019 Elsevier Patient Education  2021 [employer] General Anesthesia, Adult, Care After This sheet gives you information about how to care for yourself after your procedure. Your health care provider may also give you more specific instructions. If you have problems or questions, contact your health care provider. What can I expect after the procedure? After the procedure, the following side effects are common:  Pain or discomfort at the IV site.  Nausea.  Vomiting.  Sore throat.  Trouble concentrating.  Feeling cold or chills.  Feeling weak or tired.  Sleepiness and fatigue.  Soreness and body aches. These side effects can affect parts of the body that were not involved in surgery. Follow these instructions at home: For the time period you were told by your health care provider:  Rest.  Do not participate in activities where you could fall or become injured.  Do not drive or use machinery.  Do not drink alcohol.  Do not take sleeping pills or medicines that cause drowsiness.  Do not make important decisions or sign legal documents.  Do not take care of children on your own.   Eating and  drinking  Follow any instructions from your  health care provider about eating or drinking restrictions.  When you feel hungry, start by eating small amounts of foods that are soft and easy to digest (bland), such as toast. Gradually return to your regular diet.  Drink enough fluid to keep your urine pale yellow.  If you vomit, rehydrate by drinking water, juice, or clear broth. General instructions  If you have sleep apnea, surgery and certain medicines can increase your risk for breathing problems. Follow instructions from your health care provider about wearing your sleep device: ? Anytime you are sleeping, including during daytime naps. ? While taking prescription pain medicines, sleeping medicines, or medicines that make you drowsy.  Have a responsible adult stay with you for the time you are told. It is important to have someone help care for you until you are awake and alert.  Return to your normal activities as told by your health care provider. Ask your health care provider what activities are safe for you.  Take over-the-counter and prescription medicines only as told by your health care provider.  If you smoke, do not smoke without supervision.  Keep all follow-up visits as told by your health care provider. This is important. Contact a health care provider if:  You have nausea or vomiting that does not get better with medicine.  You cannot eat or drink without vomiting.  You have pain that does not get better with medicine.  You are unable to pass urine.  You develop a skin rash.  You have a fever.  You have redness around your IV site that gets worse. Get help right away if:  You have difficulty breathing.  You have chest pain.  You have blood in your urine or stool, or you vomit blood. Summary  After the procedure, it is common to have a sore throat or nausea. It is also common to feel tired.  Have a responsible adult stay with you for the time you are told. It is important to have someone help care  for you until you are awake and alert.  When you feel hungry, start by eating small amounts of foods that are soft and easy to digest (bland), such as toast. Gradually return to your regular diet.  Drink enough fluid to keep your urine pale yellow.  Return to your normal activities as told by your health care provider. Ask your health care provider what activities are safe for you. This information is not intended to replace advice given to you by your health care provider. Make sure you discuss any questions you have with your health care provider. Document Revised: 04/10/2020 Document Reviewed: 11/08/2019 Elsevier Patient Education  2021 [employer].

## 2020-11-04 NOTE — Telephone Encounter (Signed)
Tramadol 50 mgs that was prescribed yesterday was not showing up at [facility].  Can we see if the prescription went electronically?  They are telling him that they do not have it.    Thanks

## 2020-11-04 NOTE — Telephone Encounter (Signed)
It is showing as printed, have forwarded to Dr Romeo Apple

## 2020-11-05 ENCOUNTER — Encounter (HOSPITAL_COMMUNITY)
Admission: RE | Admit: 2020-11-05 | Discharge: 2020-11-05 | Disposition: A | Discharge status: Home / Self Care | Payer: BC Managed Care – PPO | Source: Ambulatory Visit | Attending: Orthopedic Surgery | Admitting: Orthopedic Surgery

## 2020-11-05 ENCOUNTER — Encounter (HOSPITAL_COMMUNITY): Payer: Self-pay

## 2020-11-05 ENCOUNTER — Other Ambulatory Visit: Payer: Self-pay

## 2020-11-05 ENCOUNTER — Other Ambulatory Visit (HOSPITAL_COMMUNITY)
Admission: RE | Admit: 2020-11-05 | Discharge: 2020-11-05 | Disposition: A | Discharge status: Home / Self Care | Payer: BC Managed Care – PPO | Source: Ambulatory Visit | Attending: Orthopedic Surgery | Admitting: Orthopedic Surgery

## 2020-11-05 DIAGNOSIS — Z7984 Long term (current) use of oral hypoglycemic drugs: Secondary | ICD-10-CM | POA: Diagnosis not present

## 2020-11-05 DIAGNOSIS — Z791 Long term (current) use of non-steroidal anti-inflammatories (NSAID): Secondary | ICD-10-CM | POA: Diagnosis not present

## 2020-11-05 DIAGNOSIS — E119 Type 2 diabetes mellitus without complications: Secondary | ICD-10-CM | POA: Diagnosis not present

## 2020-11-05 DIAGNOSIS — Z01818 Encounter for other preprocedural examination: Secondary | ICD-10-CM | POA: Insufficient documentation

## 2020-11-05 DIAGNOSIS — I4510 Unspecified right bundle-branch block: Secondary | ICD-10-CM | POA: Insufficient documentation

## 2020-11-05 DIAGNOSIS — S93422A Sprain of deltoid ligament of left ankle, initial encounter: Secondary | ICD-10-CM | POA: Diagnosis not present

## 2020-11-05 DIAGNOSIS — W19XXXA Unspecified fall, initial encounter: Secondary | ICD-10-CM | POA: Diagnosis not present

## 2020-11-05 DIAGNOSIS — Z20822 Contact with and (suspected) exposure to covid-19: Secondary | ICD-10-CM | POA: Diagnosis not present

## 2020-11-05 DIAGNOSIS — Z79899 Other long term (current) drug therapy: Secondary | ICD-10-CM | POA: Diagnosis not present

## 2020-11-05 DIAGNOSIS — Z01812 Encounter for preprocedural laboratory examination: Secondary | ICD-10-CM | POA: Insufficient documentation

## 2020-11-05 DIAGNOSIS — S93431A Sprain of tibiofibular ligament of right ankle, initial encounter: Secondary | ICD-10-CM | POA: Diagnosis not present

## 2020-11-05 LAB — CBC WITH DIFFERENTIAL/PLATELET
Abs Immature Granulocytes: 0.04 10*3/uL (ref 0.00–0.07)
Basophils Absolute: 0.1 10*3/uL (ref 0.0–0.1)
Basophils Relative: 1 %
Eosinophils Absolute: 0.2 10*3/uL (ref 0.0–0.5)
Eosinophils Relative: 3 %
HCT: 44.6 % (ref 39.0–52.0)
Hemoglobin: 15.6 g/dL (ref 13.0–17.0)
Immature Granulocytes: 1 %
Lymphocytes Relative: 20 %
Lymphs Abs: 1.5 10*3/uL (ref 0.7–4.0)
MCH: 31.4 pg (ref 26.0–34.0)
MCHC: 35 g/dL (ref 30.0–36.0)
MCV: 89.7 fL (ref 80.0–100.0)
Monocytes Absolute: 0.9 10*3/uL (ref 0.1–1.0)
Monocytes Relative: 12 %
Neutro Abs: 4.9 10*3/uL (ref 1.7–7.7)
Neutrophils Relative %: 63 %
Platelets: 192 10*3/uL (ref 150–400)
RBC: 4.97 MIL/uL (ref 4.22–5.81)
RDW: 12.6 % (ref 11.5–15.5)
WBC: 7.6 10*3/uL (ref 4.0–10.5)
nRBC: 0 % (ref 0.0–0.2)

## 2020-11-05 LAB — BASIC METABOLIC PANEL
Anion gap: 10 (ref 5–15)
BUN: 20 mg/dL (ref 6–20)
CO2: 23 mmol/L (ref 22–32)
Calcium: 9 mg/dL (ref 8.9–10.3)
Chloride: 98 mmol/L (ref 98–111)
Creatinine, Ser: 0.85 mg/dL (ref 0.61–1.24)
GFR, Estimated: >60 mL/min (ref >60)
Glucose, Bld: 297 mg/dL — ABNORMAL HIGH (ref 70–99)
Potassium: 3.7 mmol/L (ref 3.5–5.1)
Sodium: 131 mmol/L — ABNORMAL LOW (ref 135–145)

## 2020-11-05 LAB — HEMOGLOBIN A1C
Hgb A1c MFr Bld: 10 % — ABNORMAL HIGH (ref 4.8–5.6)
Mean Plasma Glucose: 240.3 mg/dL

## 2020-11-05 LAB — GLUCOSE, CAPILLARY: Glucose-Capillary: 271 mg/dL — ABNORMAL HIGH (ref 70–99)

## 2020-11-05 MED ORDER — TRAMADOL HCL 50 MG PO TABS: 50.0000 mg | ORAL_TABLET | Freq: Four times a day (QID) | ORAL | 0 refills | Status: DC | PRN

## 2020-11-06 ENCOUNTER — Other Ambulatory Visit: Payer: Self-pay | Admitting: Orthopedic Surgery

## 2020-11-06 ENCOUNTER — Telehealth: Payer: Self-pay | Admitting: Orthopedic Surgery

## 2020-11-06 LAB — SARS CORONAVIRUS 2 (TAT 6-24 HRS): SARS Coronavirus 2: NEGATIVE

## 2020-11-06 MED ORDER — HYDROCODONE-ACETAMINOPHEN 7.5-325 MG PO TABS: 1.0000 | ORAL_TABLET | Freq: Four times a day (QID) | ORAL | 0 refills | Status: DC | PRN

## 2020-11-06 NOTE — Telephone Encounter (Signed)
Per [facility], Shaune Pollack, said they do not have it as of yet.

## 2020-11-06 NOTE — Telephone Encounter (Signed)
I just sent him in his post op norco instead

## 2020-11-06 NOTE — Telephone Encounter (Signed)
Patient called back today to relay that he "still does not have his medication" - said was advised it was being re-sent, and states pharmacy does not have it: said surgery is scheduled for tomorrow, 11/07/20.   traMADol (ULTRAM) 50 MG tablet 30 tablet  [facility], [employer]

## 2020-11-06 NOTE — H&P (Signed)
Kenneth Bridges  10/27/2020  Body mass index is 44.48 kg/m.  Admit outpatient surgery       Encounter Diagnosis  Name Primary?  . Acute left ankle pain Yes    HISTORY SECTION :      Chief Complaint  Patient presents with  . Foot Pain    Left foot, Patient reports that the boot is helping.    HPI Kenneth Bridges is 55 years old he is diabetic his hemoglobin A1c was 10 he sustained an injury on March 18 secondary to a fall he injured his syndesmosis ligaments and MRI shows that he has a tear of the deltoid in the anterior inferior tib-fib ligament with x-rays showing widening of the medial clear space.  Although the patient denies any recent ankle injury there is a report in his chart of a ankle fracture surgery in 1988  He will be admitted through the outpatient surgery department for ankle repair on the left  Review of Systems  Constitutional: Negative for chills and fever.  Respiratory: Negative for shortness of breath.   Cardiovascular: Negative for chest pain.     has a past medical history of Diabetes mellitus without complication (HCC), Hypertension, Nasal septal deviation, Sleep apnea, and Wears glasses.        Past Surgical History:  Procedure Laterality Date  . ANKLE FRACTURE SURGERY Left 1988  . COLONOSCOPY N/A 05/19/2016   Procedure: COLONOSCOPY;  Surgeon: Corbin Ade, MD;  Location: [facility];  Service: Endoscopy;  Laterality: N/A;  9:30 am  . HERNIA REPAIR Left    inguinal  . KNEE ARTHROSCOPY WITH MEDIAL MENISECTOMY Right 08/28/2019   Procedure: KNEE ARTHROSCOPY WITH MEDIAL MENISCECTOMY;  Surgeon: Vickki Hearing, MD;  Location: [facility];  Service: Orthopedics;  Laterality: Right;  . NASAL SEPTOPLASTY W/ TURBINOPLASTY  02/13/2016   NASAL SEPTOPLASTY WITH TURBINATE REDUCTION (Bilateral)  . NASAL SEPTOPLASTY W/ TURBINOPLASTY Bilateral 02/13/2016   Procedure: NASAL SEPTOPLASTY WITH TURBINATE REDUCTION;  Surgeon: Christia Reading, MD;   Location: [facility] OR;  Service: ENT;  Laterality: Bilateral;    Social History        Tobacco Use  . Smoking status: Never Smoker  . Smokeless tobacco: Never Used  Vaping Use  . Vaping Use: Never used  Substance Use Topics  . Alcohol use: Yes    Comment: social beer drinker on the weekends (drinks 2 beers on Friday and 2 beers on Saturday)   . Drug use: No         Family History  Problem Relation Age of Onset  . Cancer Mother   . Colon cancer Neg Hx   . Colon polyps Neg Hx       No Known Allergies   Current Outpatient Medications:  .  diclofenac (VOLTAREN) 25 MG EC tablet, Take 25 mg by mouth once., Disp: , Rfl:  .  metFORMIN (GLUCOPHAGE) 500 MG tablet, Take by mouth 2 (two) times daily with a meal., Disp: , Rfl:  .  Multiple Vitamin (MULTIVITAMIN WITH MINERALS) TABS tablet, Take 1 tablet by mouth daily., Disp: , Rfl:  .  telmisartan-hydrochlorothiazide (MICARDIS HCT) 80-12.5 MG tablet, Take 1 tablet by mouth daily., Disp: , Rfl:  .  traMADol (ULTRAM) 50 MG tablet, Take 1 tablet (50 mg total) by mouth every 6 (six) hours as needed., Disp: 15 tablet, Rfl: 0   PHYSICAL EXAM SECTION: BP (!) 168/106   Pulse 71   Ht 5\' 10"  (1.778 m)   Wt (!) 310  lb (140.6 kg) Comment: In boot per patient  BMI 44.48 kg/m   Body mass index is 44.48 kg/m.   General appearance: Well-developed well-nourished no gross deformities  Eyes clear normal vision no evidence of conjunctivitis or jaundice, extraocular muscles intact  ENT: ears hearing normal, nasal passages clear, throat clear   Lymph nodes: No lymphadenopathy  Neck is supple without palpable mass, full range of motion  Cardiovascular normal pulse and perfusion in all 4 extremities normal color without edema  Neurologically deep tendon reflexes are equal and normal, no sensation loss or deficits no pathologic reflexes  Psychological: Awake alert and oriented x3 mood and affect normal  Skin no  lacerations or ulcerations no nodularity no palpable masses, no erythema or nodularity  Musculoskeletal: He has a cam walker on his injured left ankle He is tender and bruised on the medial side of the foot is swollen and is tender in the syndesmosis no fibular tenderness throughout the course  Skin is intact no blisters  MRI findings Hal Morales   IMPRESSION: 1. Complete tear of the anterior tibiofibular ligament. Small amount hemorrhagic fluid anterior to the tear. 2. Partial tear of the deltoid ligament. Severely attenuated proximal aspect of the spring ligament concerning for a complete tear. 3. Mild osseous contusion of the posterior corner of the tibial plafond and posterior aspect of the medial malleolus.     Electronically Signed   By: Elige Ko   On: 11/01/2020 08:13     The procedure has been fully reviewed with the patient; The risks and benefits of surgery have been discussed and explained and understood. Alternative treatment has also been reviewed, questions were encouraged and answered. The postoperative plan is also been reviewed.   After review of his imaging including x-ray showing widening clear space and deltoid and anterior tib-fib ligament tear we will proceed with a expiration of the deltoid ligament with possible repair and then a repair of the syndesmosis with tight rope probably 2 left ankle

## 2020-11-06 NOTE — Progress Notes (Signed)
.  medtsh  Meds ordered this encounter  Medications  . HYDROcodone-acetaminophen (NORCO) 7.5-325 MG tablet    Sig: Take 1 tablet by mouth every 6 (six) hours as needed for moderate pain.    Dispense:  28 tablet    Refill:  0

## 2020-11-06 NOTE — Progress Notes (Addendum)
Dr Alva Garnet and Dr Romeo Apple notified of Hgb A1C 10, non fasting GLU 297.  Dr Alva Garnet suggested patient's primary to adjust his diabetic medication for better control and have patient instructed to take metformin am of procedure.  Pt instructed to take meformin in the am   Littrell, Amy W, RT sent to Lillia Mountain, RN Dr Romeo Apple Does want to proceed with the surgery, not to cancel, he has been in touch with patients PCP Dr Margo Aye and they have decided to proceed as the surgery is needed urgently,thanks.        Previous Messages   ----- Message -----  From: Lillia Mountain, RN  Sent: 11/06/2020  7:14 AM EDT  To: Kenneth Hearing, MD, Amy Carlynn Purl, RT  Subject: Kenneth Ridings's Hgb A1C is 10.0          Hey Dr Romeo Apple,   Kenneth Bridges's Hgb A1C was 10 yesterday. His non fasting blood sugar was 297. He is for surgery 11/07/2020   Thanks,   Cliffton Asters RN

## 2020-11-06 NOTE — Telephone Encounter (Signed)
I clicked on Normal, since default is print  It doesn't seem to hold on Normal, before you send in make sure you click on the button for normal please

## 2020-11-07 ENCOUNTER — Ambulatory Visit (HOSPITAL_COMMUNITY): Payer: BC Managed Care – PPO

## 2020-11-07 ENCOUNTER — Encounter (HOSPITAL_COMMUNITY)
Admission: RE | Disposition: A | Discharge status: Home / Self Care | Payer: Self-pay | Source: Home / Self Care | Attending: Orthopedic Surgery

## 2020-11-07 ENCOUNTER — Ambulatory Visit (HOSPITAL_COMMUNITY): Payer: BC Managed Care – PPO | Admitting: Anesthesiology

## 2020-11-07 ENCOUNTER — Ambulatory Visit (HOSPITAL_COMMUNITY)
Admission: RE | Admit: 2020-11-07 | Discharge: 2020-11-07 | Disposition: A | Discharge status: Home / Self Care | Payer: BC Managed Care – PPO | Attending: Orthopedic Surgery | Admitting: Orthopedic Surgery

## 2020-11-07 DIAGNOSIS — E119 Type 2 diabetes mellitus without complications: Secondary | ICD-10-CM | POA: Insufficient documentation

## 2020-11-07 DIAGNOSIS — S93422A Sprain of deltoid ligament of left ankle, initial encounter: Secondary | ICD-10-CM | POA: Diagnosis not present

## 2020-11-07 DIAGNOSIS — S93431A Sprain of tibiofibular ligament of right ankle, initial encounter: Secondary | ICD-10-CM | POA: Insufficient documentation

## 2020-11-07 DIAGNOSIS — Z7984 Long term (current) use of oral hypoglycemic drugs: Secondary | ICD-10-CM | POA: Insufficient documentation

## 2020-11-07 DIAGNOSIS — W19XXXA Unspecified fall, initial encounter: Secondary | ICD-10-CM | POA: Diagnosis not present

## 2020-11-07 DIAGNOSIS — S93432A Sprain of tibiofibular ligament of left ankle, initial encounter: Secondary | ICD-10-CM

## 2020-11-07 DIAGNOSIS — Z79899 Other long term (current) drug therapy: Secondary | ICD-10-CM | POA: Insufficient documentation

## 2020-11-07 DIAGNOSIS — Z20822 Contact with and (suspected) exposure to covid-19: Secondary | ICD-10-CM | POA: Insufficient documentation

## 2020-11-07 DIAGNOSIS — S93432D Sprain of tibiofibular ligament of left ankle, subsequent encounter: Secondary | ICD-10-CM | POA: Diagnosis not present

## 2020-11-07 DIAGNOSIS — Z791 Long term (current) use of non-steroidal anti-inflammatories (NSAID): Secondary | ICD-10-CM | POA: Diagnosis not present

## 2020-11-07 DIAGNOSIS — S82892A Other fracture of left lower leg, initial encounter for closed fracture: Secondary | ICD-10-CM

## 2020-11-07 HISTORY — PX: SYNDESMOSIS REPAIR: SHX5182

## 2020-11-07 LAB — GLUCOSE, CAPILLARY
Glucose-Capillary: 325 mg/dL — ABNORMAL HIGH (ref 70–99)
Glucose-Capillary: 332 mg/dL — ABNORMAL HIGH (ref 70–99)
Glucose-Capillary: 261 mg/dL — ABNORMAL HIGH (ref 70–99)
Glucose-Capillary: 287 mg/dL — ABNORMAL HIGH (ref 70–99)

## 2020-11-07 SURGERY — REPAIR, SYNDESMOSIS, ANKLE
Anesthesia: General | Site: Ankle | Laterality: Left

## 2020-11-07 MED ORDER — FENTANYL CITRATE (PF) 100 MCG/2ML IJ SOLN
INTRAMUSCULAR | Status: AC
  Filled 2020-11-07: qty 2

## 2020-11-07 MED ORDER — BUPIVACAINE-EPINEPHRINE (PF) 0.5% -1:200000 IJ SOLN
INTRAMUSCULAR | Status: AC
  Filled 2020-11-07: qty 30

## 2020-11-07 MED ORDER — LACTATED RINGERS IV SOLN: INTRAVENOUS | Status: DC

## 2020-11-07 MED ORDER — KETOROLAC TROMETHAMINE 30 MG/ML IJ SOLN
INTRAMUSCULAR | Status: DC | PRN
  Administered 2020-11-07: 30 mg via INTRAVENOUS

## 2020-11-07 MED ORDER — MIDAZOLAM HCL 2 MG/2ML IJ SOLN
INTRAMUSCULAR | Status: AC
  Filled 2020-11-07: qty 2

## 2020-11-07 MED ORDER — BUPIVACAINE-EPINEPHRINE (PF) 0.5% -1:200000 IJ SOLN
INTRAMUSCULAR | Status: DC | PRN
  Administered 2020-11-07: 30 mL

## 2020-11-07 MED ORDER — FENTANYL CITRATE (PF) 100 MCG/2ML IJ SOLN
25.0000 ug | INTRAMUSCULAR | Status: DC | PRN
Start: 2020-11-07 — End: 2020-11-07
  Administered 2020-11-07: 50 ug via INTRAVENOUS
  Filled 2020-11-07: qty 2

## 2020-11-07 MED ORDER — ORAL CARE MOUTH RINSE: 15.0000 mL | Freq: Once | OROMUCOSAL | Status: AC

## 2020-11-07 MED ORDER — KETAMINE HCL 10 MG/ML IJ SOLN
INTRAMUSCULAR | Status: DC | PRN
  Administered 2020-11-07: 50 mg via INTRAVENOUS

## 2020-11-07 MED ORDER — LABETALOL HCL 5 MG/ML IV SOLN
INTRAVENOUS | Status: DC | PRN
  Administered 2020-11-07 (×2): 5 mg via INTRAVENOUS

## 2020-11-07 MED ORDER — ACETAMINOPHEN 10 MG/ML IV SOLN
INTRAVENOUS | Status: AC
  Filled 2020-11-07: qty 100

## 2020-11-07 MED ORDER — 0.9 % SODIUM CHLORIDE (POUR BTL) OPTIME
TOPICAL | Status: DC | PRN
  Administered 2020-11-07: 1000 mL

## 2020-11-07 MED ORDER — HYDROCODONE-ACETAMINOPHEN 5-325 MG PO TABS
2.0000 | ORAL_TABLET | Freq: Once | ORAL | Status: AC
  Administered 2020-11-07: 2 via ORAL
  Filled 2020-11-07: qty 2

## 2020-11-07 MED ORDER — PHENYLEPHRINE HCL (PRESSORS) 10 MG/ML IV SOLN
INTRAVENOUS | Status: DC | PRN
  Administered 2020-11-07: 80 ug via INTRAVENOUS
  Administered 2020-11-07: 120 ug via INTRAVENOUS

## 2020-11-07 MED ORDER — ONDANSETRON HCL 4 MG/2ML IJ SOLN
INTRAMUSCULAR | Status: DC | PRN
  Administered 2020-11-07: 4 mg via INTRAVENOUS

## 2020-11-07 MED ORDER — ACETAMINOPHEN 10 MG/ML IV SOLN
INTRAVENOUS | Status: DC | PRN
  Administered 2020-11-07: 1000 mg via INTRAVENOUS

## 2020-11-07 MED ORDER — LIDOCAINE HCL (CARDIAC) PF 100 MG/5ML IV SOSY
PREFILLED_SYRINGE | INTRAVENOUS | Status: DC | PRN
Start: 2020-11-07 — End: 2020-11-07
  Administered 2020-11-07: 50 mg via INTRATRACHEAL

## 2020-11-07 MED ORDER — DEXAMETHASONE SODIUM PHOSPHATE 10 MG/ML IJ SOLN
INTRAMUSCULAR | Status: AC
  Filled 2020-11-07: qty 1

## 2020-11-07 MED ORDER — SUGAMMADEX SODIUM 200 MG/2ML IV SOLN
INTRAVENOUS | Status: DC | PRN
  Administered 2020-11-07: 50 mg via INTRAVENOUS
  Administered 2020-11-07: 30 mg via INTRAVENOUS
  Administered 2020-11-07: 20 mg via INTRAVENOUS
  Administered 2020-11-07: 50 mg via INTRAVENOUS

## 2020-11-07 MED ORDER — PROPOFOL 10 MG/ML IV BOLUS
INTRAVENOUS | Status: DC | PRN
  Administered 2020-11-07: 100 mg via INTRAVENOUS

## 2020-11-07 MED ORDER — LACTATED RINGERS IV SOLN: INTRAVENOUS | Status: DC | PRN

## 2020-11-07 MED ORDER — CEFAZOLIN SODIUM-DEXTROSE 1-4 GM/50ML-% IV SOLN
INTRAVENOUS | Status: AC
  Filled 2020-11-07: qty 50

## 2020-11-07 MED ORDER — SUCCINYLCHOLINE CHLORIDE 200 MG/10ML IV SOSY
PREFILLED_SYRINGE | INTRAVENOUS | Status: DC | PRN
  Administered 2020-11-07: 120 mg via INTRAVENOUS

## 2020-11-07 MED ORDER — HYDROCODONE-ACETAMINOPHEN 5-325 MG PO TABS
ORAL_TABLET | ORAL | Status: AC
  Filled 2020-11-07: qty 1

## 2020-11-07 MED ORDER — DEXMEDETOMIDINE (PRECEDEX) IN NS 20 MCG/5ML (4 MCG/ML) IV SYRINGE
PREFILLED_SYRINGE | INTRAVENOUS | Status: DC | PRN
  Administered 2020-11-07: 4 ug via INTRAVENOUS
  Administered 2020-11-07 (×2): 8 ug via INTRAVENOUS

## 2020-11-07 MED ORDER — KETAMINE HCL 50 MG/5ML IJ SOSY
PREFILLED_SYRINGE | INTRAMUSCULAR | Status: AC
  Filled 2020-11-07: qty 5

## 2020-11-07 MED ORDER — ONDANSETRON HCL 4 MG/2ML IJ SOLN
INTRAMUSCULAR | Status: AC
  Filled 2020-11-07: qty 4

## 2020-11-07 MED ORDER — FENTANYL CITRATE (PF) 100 MCG/2ML IJ SOLN
INTRAMUSCULAR | Status: DC | PRN
  Administered 2020-11-07 (×3): 50 ug via INTRAVENOUS
  Administered 2020-11-07: 200 ug via INTRAVENOUS

## 2020-11-07 MED ORDER — DEXTROSE 5 % IV SOLN
3.0000 g | INTRAVENOUS | Status: AC
  Administered 2020-11-07: 3 g via INTRAVENOUS

## 2020-11-07 MED ORDER — EPHEDRINE 5 MG/ML INJ
INTRAVENOUS | Status: AC
  Filled 2020-11-07: qty 20

## 2020-11-07 MED ORDER — MIDAZOLAM HCL 5 MG/5ML IJ SOLN
INTRAMUSCULAR | Status: DC | PRN
  Administered 2020-11-07: 2 mg via INTRAVENOUS

## 2020-11-07 MED ORDER — GLYCOPYRROLATE PF 0.2 MG/ML IJ SOSY
PREFILLED_SYRINGE | INTRAMUSCULAR | Status: DC | PRN
  Administered 2020-11-07: .2 mg via INTRAVENOUS

## 2020-11-07 MED ORDER — CEFAZOLIN SODIUM-DEXTROSE 2-4 GM/100ML-% IV SOLN
INTRAVENOUS | Status: AC
  Filled 2020-11-07: qty 100

## 2020-11-07 MED ORDER — ONDANSETRON HCL 4 MG/2ML IJ SOLN: 4.0000 mg | Freq: Once | INTRAMUSCULAR | Status: DC | PRN

## 2020-11-07 MED ORDER — ONDANSETRON HCL 4 MG/2ML IJ SOLN
4.0000 mg | Freq: Once | INTRAMUSCULAR | Status: AC
  Administered 2020-11-07: 4 mg via INTRAVENOUS
  Filled 2020-11-07: qty 2

## 2020-11-07 MED ORDER — SUCCINYLCHOLINE CHLORIDE 200 MG/10ML IV SOSY
PREFILLED_SYRINGE | INTRAVENOUS | Status: AC
  Filled 2020-11-07: qty 10

## 2020-11-07 MED ORDER — LABETALOL HCL 5 MG/ML IV SOLN
INTRAVENOUS | Status: AC
  Filled 2020-11-07: qty 4

## 2020-11-07 MED ORDER — ROCURONIUM BROMIDE 100 MG/10ML IV SOLN
INTRAVENOUS | Status: DC | PRN
  Administered 2020-11-07: 65 mg via INTRAVENOUS
  Administered 2020-11-07: 5 mg via INTRAVENOUS

## 2020-11-07 MED ORDER — CHLORHEXIDINE GLUCONATE 0.12 % MT SOLN
15.0000 mL | Freq: Once | OROMUCOSAL | Status: AC
  Administered 2020-11-07: 15 mL via OROMUCOSAL

## 2020-11-07 MED ORDER — KETOROLAC TROMETHAMINE 30 MG/ML IJ SOLN
INTRAMUSCULAR | Status: AC
  Filled 2020-11-07: qty 1

## 2020-11-07 MED ORDER — FENTANYL CITRATE (PF) 250 MCG/5ML IJ SOLN
INTRAMUSCULAR | Status: AC
  Filled 2020-11-07: qty 5

## 2020-11-07 MED ORDER — IBUPROFEN 800 MG PO TABS
800.0000 mg | ORAL_TABLET | Freq: Once | ORAL | Status: AC
  Administered 2020-11-07: 800 mg via ORAL
  Filled 2020-11-07: qty 1

## 2020-11-07 MED ORDER — PROPOFOL 10 MG/ML IV BOLUS
INTRAVENOUS | Status: AC
  Filled 2020-11-07: qty 20

## 2020-11-07 MED ORDER — INSULIN ASPART 100 UNIT/ML ~~LOC~~ SOLN
8.0000 [IU] | Freq: Once | SUBCUTANEOUS | Status: AC
  Administered 2020-11-07: 8 [IU] via SUBCUTANEOUS

## 2020-11-07 MED ORDER — MEPERIDINE HCL 50 MG/ML IJ SOLN: 6.2500 mg | INTRAMUSCULAR | Status: DC | PRN

## 2020-11-07 SURGICAL SUPPLY — 75 items
ANCH SUT 2 CRKSCW 12X3.5 ST (Anchor) ×2 IMPLANT
ANCH SUT NDL DX FBRTK STRL LF (Anchor) ×2 IMPLANT
ANCHOR SUT CORKSCREW 3.5X12 (Anchor) ×4 IMPLANT
ANCHOR SUT FBRTK 1.3 SUTTAP (Anchor) ×4 IMPLANT
APL PRP STRL LF DISP 70% ISPRP (MISCELLANEOUS) ×2
BANDAGE ELASTIC 4 VELCRO NS (GAUZE/BANDAGES/DRESSINGS) ×4 IMPLANT
BANDAGE ESMARK 4X12 BL STRL LF (DISPOSABLE) ×1 IMPLANT
BIT DRILL 3.5X122MM AO FIT (BIT) IMPLANT
BIT DRILL CANN 2.7 (BIT)
BIT DRILL SRG 2.7XCANN AO CPLG (BIT) IMPLANT
BIT DRL SRG 2.7XCANN AO CPLNG (BIT)
BLADE SURG SZ10 CARB STEEL (BLADE) ×2 IMPLANT
BNDG CMPR 12X4 ELC STRL LF (DISPOSABLE) ×1
BNDG CMPR STD VLCR NS LF 5.8X4 (GAUZE/BANDAGES/DRESSINGS) ×2
BNDG COHESIVE 4X5 TAN STRL (GAUZE/BANDAGES/DRESSINGS) ×2 IMPLANT
BNDG ELASTIC 4X5.8 VLCR NS LF (GAUZE/BANDAGES/DRESSINGS) ×4 IMPLANT
BNDG ESMARK 4X12 BLUE STRL LF (DISPOSABLE) ×2
CHLORAPREP W/TINT 26 (MISCELLANEOUS) ×4 IMPLANT
CLOTH BEACON ORANGE TIMEOUT ST (SAFETY) ×2 IMPLANT
COVER LIGHT HANDLE STERIS (MISCELLANEOUS) ×4 IMPLANT
COVER WAND RF STERILE (DRAPES) ×2 IMPLANT
CUFF TOURN SGL QUICK 34 (TOURNIQUET CUFF) ×2
CUFF TRNQT CYL 34X4.125X (TOURNIQUET CUFF) ×1 IMPLANT
DECANTER SPIKE VIAL GLASS SM (MISCELLANEOUS) IMPLANT
DRAPE C-ARM FOLDED MOBILE STRL (DRAPES) ×2 IMPLANT
DRILL 2.6X122MM WL AO SHAFT (BIT) IMPLANT
ELECT REM PT RETURN 9FT ADLT (ELECTROSURGICAL) ×2
ELECTRODE REM PT RTRN 9FT ADLT (ELECTROSURGICAL) ×1 IMPLANT
GAUZE SPONGE 4X4 12PLY STRL (GAUZE/BANDAGES/DRESSINGS) ×2 IMPLANT
GAUZE XEROFORM 5X9 LF (GAUZE/BANDAGES/DRESSINGS) ×2 IMPLANT
GLOVE SKINSENSE NS SZ8.0 LF (GLOVE) ×1
GLOVE SKINSENSE STRL SZ8.0 LF (GLOVE) ×1 IMPLANT
GLOVE SS BIOGEL STRL SZ 6.5 (GLOVE) ×1 IMPLANT
GLOVE SS BIOGEL STRL SZ 7.5 (GLOVE) ×1 IMPLANT
GLOVE SS N UNI LF 8.5 STRL (GLOVE) ×2 IMPLANT
GLOVE SUPERSENSE BIOGEL SZ 6.5 (GLOVE) ×1
GLOVE SUPERSENSE BIOGEL SZ 7.5 (GLOVE) ×1
GLOVE SURG UNDER POLY LF SZ7 (GLOVE) ×8 IMPLANT
GOWN STRL REUS W/TWL LRG LVL3 (GOWN DISPOSABLE) ×4 IMPLANT
GOWN STRL REUS W/TWL XL LVL3 (GOWN DISPOSABLE) ×2 IMPLANT
INST SET MINOR BONE (KITS) ×2 IMPLANT
K-WIRE 1.6X150 (WIRE)
K-WIRE FX150X1.6XKRSH (WIRE)
K-WIRE ORTHOPEDIC 1.4X150L (WIRE)
K-WIRE SMOOTH 2.0X150 (WIRE)
KIT FIBERTAK DX 1.6 DISP (KITS) ×2 IMPLANT
KIT TURNOVER KIT A (KITS) ×2 IMPLANT
KWIRE FX150X1.6XKRSH (WIRE) IMPLANT
KWIRE ORTHOPEDIC 1.4X150L (WIRE) IMPLANT
KWIRE SMOOTH 2.0X150 (WIRE) IMPLANT
MANIFOLD NEPTUNE II (INSTRUMENTS) ×2 IMPLANT
NEEDLE HYPO 21X1.5 SAFETY (NEEDLE) ×2 IMPLANT
NEEDLE MAYO 6 CRC TAPER PT (NEEDLE) ×2 IMPLANT
NS IRRIG 1000ML POUR BTL (IV SOLUTION) ×2 IMPLANT
PACK BASIC LIMB (CUSTOM PROCEDURE TRAY) ×2 IMPLANT
PAD ABD 5X9 TENDERSORB (GAUZE/BANDAGES/DRESSINGS) ×4 IMPLANT
PAD ARMBOARD 7.5X6 YLW CONV (MISCELLANEOUS) ×2 IMPLANT
PAD CAST 4YDX4 CTTN HI CHSV (CAST SUPPLIES) ×2 IMPLANT
PADDING CAST COTTON 4X4 STRL (CAST SUPPLIES) ×4
PLATE SYNDESMOSIS 2H 1.5 (Plate) ×2 IMPLANT
PLATE SYNDESMOSIS 2H 1.5 STRL (Plate) ×1 IMPLANT
SET BASIN LINEN APH (SET/KITS/TRAYS/PACK) ×2 IMPLANT
SPLINT IMMOBILIZER J 3INX20FT (CAST SUPPLIES)
SPLINT J IMMOBILIZER 3X20FT (CAST SUPPLIES) IMPLANT
SPLINT J IMMOBILIZER 4X20FT (CAST SUPPLIES) IMPLANT
SPLINT J PLASTER J 4INX20Y (CAST SUPPLIES)
SPONGE LAP 18X18 RF (DISPOSABLE) ×2 IMPLANT
STAPLER VISISTAT 35W (STAPLE) ×2 IMPLANT
SUT ETHILON 3 0 FSL (SUTURE) IMPLANT
SUT MON AB 0 CT1 (SUTURE) ×4 IMPLANT
SUT MON AB 2-0 CT1 36 (SUTURE) ×4 IMPLANT
SUT VIC AB 1 CT1 27 (SUTURE) ×10
SUT VIC AB 1 CT1 27XBRD ANTBC (SUTURE) ×5 IMPLANT
SYR 30ML LL (SYRINGE) ×2 IMPLANT
SYR BULB IRRIG 60ML STRL (SYRINGE) ×2 IMPLANT

## 2020-11-07 NOTE — Brief Op Note (Signed)
11/07/2020  11:06 AM  PATIENT:  Kenneth Bridges  56 y.o. male  PRE-OPERATIVE DIAGNOSIS:  Left ankle Syndesmosis repair and medial deltoid repair  POST-OPERATIVE DIAGNOSIS:  Left ankle Syndesmosis repair and medial deltoid repair  PROCEDURE:  Procedure(s): SYNDESMOSIS REPAIR AND MEDIAL DELTOID REPAIR LEFT ANKLE (Left)   3 suture anchors medially: 1 3.5 corkscrew and 2 DX FIBERTAK loaded w/ suture tape , 1.33mm  2, tight ropes and 2 hole plate laterally   SURGEON:  Surgeon(s) and Role:    Vickki Hearing, MD - Primary  Details of procedure  The patient was identified as Kenneth Bridges in the preop area the left ankle was confirmed as the surgical site.  It was then marked chart review was completed implants were checked images were reviewed.  The patient was taken to the operating for general anesthesia in the supine position.  After sterile prep and drape and timeout the C-arm was brought in and exam under anesthesia was performed.  Stress views of the ankle showed widening of the medial clear space and reduction with adduction internal rotation.  The first incision was made over the medial malleolus and divided full-thickness skin flaps down to the deltoid ligament which was avulsed from the bone of the medial malleolus with 1 or 2 fibers still intact.  I removed the remaining intact fibers freshened up the ends freshened the bone with a rondure and then placed a 3.5 corkscrew anchor, a second anchor fractured and was left intact in the bone, we then placed 2 DX FIBERTAK anchors loaded w/ suture tape , 1.21mm   Then 2 additional anchors were placed.  The deltoid ligament was held with three #1 Vicryl sutures  I then reduced the ligament and then took a C arm x-ray to confirm reduction of the clear space which was restored to anatomic position  I then made a lateral incision over the fibula and divided down to bone with full-thickness skin flaps.  I then took the C arm and  estimated the position of the tight ropes and then estimated it with a 2 hole plate in place and then secured the plate with a pin.   We then drilled both holes for the tight rope.  We passed the tight rope.  Once both tight ropes were passed and cinched down we confirmed reduction of the mortise.  These were then secured and ties were placed over the buttons  The medial side was then completed by inverting the foot and tying down the 3 suture anchors  Final images showed reduction of the ankle mortise and hardware was intact  Both wounds were irrigated with copious amounts of saline  The lateral wound was closed with 0 Monocryl in running fashion  The medial side was closed with interrupted 0 and 2-0 Monocryl  Staples were used to close the skin edges and 15 cc of Marcaine was placed proximal to each wound as a field block  Sterile dressings were applied the tourniquet was released we had good color and capillary refill returned to the foot  The patient was then taken to the recovery room after extubation in stable condition PHYSICIAN ASSISTANT:   ASSISTANTS: Canary Brim  ANESTHESIA:   general  EBL:  10 mL   BLOOD ADMINISTERED:none  DRAINS: none   LOCAL MEDICATIONS USED:  MARCAINE     SPECIMEN:  No Specimen  DISPOSITION OF SPECIMEN:  N/A  COUNTS:  YES  TOURNIQUET:   Total Tourniquet Time Documented: Thigh (  Left) - 73 minutes Total: Thigh (Left) - 73 minutes   DICTATION: .Reubin Milan Dictation  PLAN OF CARE: Discharge to home after PACU  PATIENT DISPOSITION:  PACU - hemodynamically stable.   Delay start of Pharmacological VTE agent (>24hrs) due to surgical blood loss or risk of bleeding: not applicable

## 2020-11-07 NOTE — Anesthesia Preprocedure Evaluation (Signed)
Anesthesia Evaluation  Patient identified by MRN, date of birth, ID band Patient awake    Reviewed: Allergy & Precautions, NPO status , Patient's Chart, lab work & pertinent test results  History of Anesthesia Complications Negative for: history of anesthetic complications  Airway Mallampati: III  TM Distance: >3 FB Neck ROM: Full    Dental  (+) Dental Advisory Given,    Pulmonary sleep apnea and Continuous Positive Airway Pressure Ventilation ,    Pulmonary exam normal breath sounds clear to auscultation       Cardiovascular Exercise Tolerance: Good hypertension, Pt. on medications  Rhythm:Regular Rate:Normal  27-Aug-2019 08:47:08 [facility]-300 ROUTINE RECORD Normal sinus rhythm Right axis deviation Incomplete right bundle branch block Possible Right ventricular hypertrophy Cannot rule out Anterior infarct , age undetermined Abnormal ECG   Neuro/Psych negative neurological ROS  negative psych ROS   GI/Hepatic negative GI ROS, Neg liver ROS,   Endo/Other  diabetes, Poorly Controlled, Type 2, Oral Hypoglycemic AgentsMorbid obesity  Renal/GU negative Renal ROS  negative genitourinary   Musculoskeletal Right knee pain   Abdominal   Peds negative pediatric ROS (+)  Hematology negative hematology ROS (+)   Anesthesia Other Findings   Reproductive/Obstetrics negative OB ROS                             Anesthesia Physical  Anesthesia Plan  ASA: III  Anesthesia Plan: General   Post-op Pain Management:    Induction: Intravenous  PONV Risk Score and Plan: 3 and Midazolam, Ondansetron and Dexamethasone  Airway Management Planned: Oral ETT  Additional Equipment:   Intra-op Plan:   Post-operative Plan: Extubation in OR  Informed Consent: I have reviewed the patients History and Physical, chart, labs and discussed the procedure including the risks, benefits and  alternatives for the proposed anesthesia with the patient or authorized representative who has indicated his/her understanding and acceptance.     Dental advisory given  Plan Discussed with: CRNA and Surgeon  Anesthesia Plan Comments:         Anesthesia Quick Evaluation

## 2020-11-07 NOTE — Brief Op Note (Signed)
11/07/2020  11:18 AM  PATIENT:  Kenneth Bridges  55 y.o. male  PRE-OPERATIVE DIAGNOSIS:  Left ankle Syndesmosis repair and medial deltoid repair  POST-OPERATIVE DIAGNOSIS:  Left ankle Syndesmosis repair and medial deltoid repair  PROCEDURE:  Procedure(s): SYNDESMOSIS REPAIR AND MEDIAL DELTOID REPAIR LEFT ANKLE (Left)   3 suture anchors medially: 1 3.5 corkscrew and 2 DX FIBERTAK loaded w/ suture tape , 1.48mm  2, tight ropes and 2 hole plate laterally   SURGEON:  Surgeon(s) and Role:    Vickki Hearing, MD - Primary  Details of procedure  The patient was identified as Kenneth Bridges in the preop area the left ankle was confirmed as the surgical site.  It was then marked chart review was completed implants were checked images were reviewed.  The patient was taken to the operating for general anesthesia in the supine position.  After sterile prep and drape and timeout the C-arm was brought in and exam under anesthesia was performed.  Stress views of the ankle showed widening of the medial clear space and reduction with adduction internal rotation.  The first incision was made over the medial malleolus and divided full-thickness skin flaps down to the deltoid ligament which was avulsed from the bone of the medial malleolus with 1 or 2 fibers still intact.  I removed the remaining intact fibers freshened up the ends freshened the bone with a rondure and then placed a 3.5 corkscrew anchor, a second anchor fractured and was left intact in the bone, we then placed 2 DX FIBERTAK anchors loaded w/ suture tape , 1.53mm   Then 2 additional anchors were placed.  The deltoid ligament was held with three #1 Vicryl sutures  I then reduced the ligament and then took a C arm x-ray to confirm reduction of the clear space which was restored to anatomic position  I then made a lateral incision over the fibula and divided down to bone with full-thickness skin flaps.  I then took the C arm and  estimated the position of the tight ropes and then estimated it with a 2 hole plate in place and then secured the plate with a pin.   We then drilled both holes for the tight rope.  We passed the tight rope.  Once both tight ropes were passed and cinched down we confirmed reduction of the mortise.  These were then secured and ties were placed over the buttons  The medial side was then completed by inverting the foot and tying down the 3 suture anchors  Final images showed reduction of the ankle mortise and hardware was intact  Both wounds were irrigated with copious amounts of saline  The lateral wound was closed with 0 Monocryl in running fashion  The medial side was closed with interrupted 0 and 2-0 Monocryl  Staples were used to close the skin edges and 15 cc of Marcaine was placed proximal to each wound as a field block  Sterile dressings were applied the tourniquet was released we had good color and capillary refill returned to the foot  The patient was then taken to the recovery room after extubation in stable condition PHYSICIAN ASSISTANT:   ASSISTANTS: Canary Brim  ANESTHESIA:   general  EBL:  10 mL   BLOOD ADMINISTERED:none  DRAINS: none   LOCAL MEDICATIONS USED:  MARCAINE     SPECIMEN:  No Specimen  DISPOSITION OF SPECIMEN:  N/A  COUNTS:  YES  TOURNIQUET:   Total Tourniquet Time Documented: Thigh (  Left) - 73 minutes Total: Thigh (Left) - 73 minutes   DICTATION: .Reubin Milan Dictation  PLAN OF CARE: Discharge to home after PACU  PATIENT DISPOSITION:  PACU - hemodynamically stable.   Delay start of Pharmacological VTE agent (>24hrs) due to surgical blood loss or risk of bleeding: not applicable

## 2020-11-07 NOTE — Op Note (Signed)
11/07/2020  11:18 AM  PATIENT:  Kenneth Bridges  55 y.o. male  PRE-OPERATIVE DIAGNOSIS:  Left ankle Syndesmosis repair and medial deltoid repair  POST-OPERATIVE DIAGNOSIS:  Left ankle Syndesmosis repair and medial deltoid repair  PROCEDURE:  Procedure(s): SYNDESMOSIS REPAIR AND MEDIAL DELTOID REPAIR LEFT ANKLE (Left)   3 suture anchors medially: 1 3.5 corkscrew and 2 DX FIBERTAK loaded w/ suture tape , 1.48mm  2, tight ropes and 2 hole plate laterally   SURGEON:  Surgeon(s) and Role:    Vickki Hearing, MD - Primary  Details of procedure  The patient was identified as [name] in the preop area the left ankle was confirmed as the surgical site.  It was then marked chart review was completed implants were checked images were reviewed.  The patient was taken to the operating for general anesthesia in the supine position.  After sterile prep and drape and timeout the C-arm was brought in and exam under anesthesia was performed.  Stress views of the ankle showed widening of the medial clear space and reduction with adduction internal rotation.  The first incision was made over the medial malleolus and divided full-thickness skin flaps down to the deltoid ligament which was avulsed from the bone of the medial malleolus with 1 or 2 fibers still intact.  I removed the remaining intact fibers freshened up the ends freshened the bone with a rondure and then placed a 3.5 corkscrew anchor, a second anchor fractured and was left intact in the bone, we then placed 2 DX FIBERTAK anchors loaded w/ suture tape , 1.53mm   Then 2 additional anchors were placed.  The deltoid ligament was held with three #1 Vicryl sutures  I then reduced the ligament and then took a C arm x-ray to confirm reduction of the clear space which was restored to anatomic position  I then made a lateral incision over the fibula and divided down to bone with full-thickness skin flaps.  I then took the C arm and  estimated the position of the tight ropes and then estimated it with a 2 hole plate in place and then secured the plate with a pin.   We then drilled both holes for the tight rope.  We passed the tight rope.  Once both tight ropes were passed and cinched down we confirmed reduction of the mortise.  These were then secured and ties were placed over the buttons  The medial side was then completed by inverting the foot and tying down the 3 suture anchors  Final images showed reduction of the ankle mortise and hardware was intact  Both wounds were irrigated with copious amounts of saline  The lateral wound was closed with 0 Monocryl in running fashion  The medial side was closed with interrupted 0 and 2-0 Monocryl  Staples were used to close the skin edges and 15 cc of Marcaine was placed proximal to each wound as a field block  Sterile dressings were applied the tourniquet was released we had good color and capillary refill returned to the foot  The patient was then taken to the recovery room after extubation in stable condition PHYSICIAN ASSISTANT:   ASSISTANTS: Canary Brim  ANESTHESIA:   general  EBL:  10 mL   BLOOD ADMINISTERED:none  DRAINS: none   LOCAL MEDICATIONS USED:  MARCAINE     SPECIMEN:  No Specimen  DISPOSITION OF SPECIMEN:  N/A  COUNTS:  YES  TOURNIQUET:   Total Tourniquet Time Documented: Thigh (  Left) - 73 minutes Total: Thigh (Left) - 73 minutes   DICTATION: .Reubin Milan Dictation  PLAN OF CARE: Discharge to home after PACU  PATIENT DISPOSITION:  PACU - hemodynamically stable.   Delay start of Pharmacological VTE agent (>24hrs) due to surgical blood loss or risk of bleeding: not applicable

## 2020-11-07 NOTE — Anesthesia Procedure Notes (Signed)
Procedure Name: Intubation Date/Time: 11/07/2020 9:25 AM Performed by: Gayland Curry, CRNA Pre-anesthesia Checklist: Patient identified, Emergency Drugs available, Suction available and Patient being monitored Patient Re-evaluated:Patient Re-evaluated prior to induction Oxygen Delivery Method: Circle system utilized Preoxygenation: Pre-oxygenation with 100% oxygen Induction Type: IV induction Ventilation: Oral airway inserted - appropriate to patient size and Mask ventilation with difficulty Laryngoscope Size: Mac and 4 Grade View: Grade I Tube type: Oral Tube size: 7.0 mm Number of attempts: 1 Placement Confirmation: ETT inserted through vocal cords under direct vision,  positive ETCO2 and breath sounds checked- equal and bilateral Secured at: 23 cm Tube secured with: Tape Dental Injury: Teeth and Oropharynx as per pre-operative assessment  Comments: Facial hair giving poor mask seal with difficulty ventilating, proceeded to intubation

## 2020-11-07 NOTE — Discharge Instructions (Signed)

## 2020-11-07 NOTE — Anesthesia Postprocedure Evaluation (Signed)
Anesthesia Post Note  Patient: Kenneth Bridges  Procedure(s) Performed: SYNDESMOSIS REPAIR AND MEDIAL DELTOID REPAIR LEFT ANKLE (Left Ankle)  Patient location during evaluation: PACU Anesthesia Type: General Level of consciousness: awake and alert and oriented Pain management: pain level controlled Vital Signs Assessment: post-procedure vital signs reviewed and stable Respiratory status: spontaneous breathing and respiratory function stable Cardiovascular status: blood pressure returned to baseline and stable Postop Assessment: no apparent nausea or vomiting Anesthetic complications: no   No complications documented.   Last Vitals:  Vitals:   11/07/20 1230 11/07/20 1247  BP: (!) 155/97 (!) 172/93  Pulse: 70 74  Resp: 16 18  Temp:  36.7 C  SpO2: 90% 98%    Last Pain:  Vitals:   11/07/20 1247  TempSrc: Oral  PainSc: 5                  Azyiah Bo C Derric Dealmeida

## 2020-11-07 NOTE — Transfer of Care (Signed)
Immediate Anesthesia Transfer of Care Note  Patient: Kenneth Bridges  Procedure(s) Performed: SYNDESMOSIS REPAIR AND MEDIAL DELTOID REPAIR LEFT ANKLE (Left )  Patient Location: PACU  Anesthesia Type:General  Level of Consciousness: awake, alert , oriented and patient cooperative  Airway & Oxygen Therapy: Patient Spontanous Breathing and Patient connected to face mask oxygen  Post-op Assessment: Report given to RN  Post vital signs: Reviewed and stable  Last Vitals:  Vitals Value Taken Time  BP 154/78 11/07/20 1115  Temp    Pulse 79 11/07/20 1116  Resp 21 11/07/20 1116  SpO2 78 % 11/07/20 1116  Vitals shown include unvalidated device data.  Last Pain:  Vitals:   11/07/20 0753  TempSrc: Oral  PainSc: 3       Patients Stated Pain Goal: 6 (11/07/20 0753)  Complications: No complications documented.

## 2020-11-07 NOTE — Interval H&P Note (Signed)
History and Physical Interval Note:  11/07/2020 9:13 AM  Kenneth Bridges  has presented today for surgery, with the diagnosis of Left ankle Syndesmosis repair and medial deltoid repair.  The various methods of treatment have been discussed with the patient and family. After consideration of risks, benefits and other options for treatment, the patient has consented to  Procedure(s): SYNDESMOSIS REPAIR AND MEDIAL DELTOID REPAIR LEFT ANKLE (Left) as a surgical intervention.  The patient's history has been reviewed, patient examined, no change in status, stable for surgery.  I have reviewed the patient's chart and labs.  Questions were answered to the patient's satisfaction.     Fuller Canada

## 2020-11-10 ENCOUNTER — Ambulatory Visit: Payer: BC Managed Care – PPO | Admitting: Orthopedic Surgery

## 2020-11-11 ENCOUNTER — Encounter (HOSPITAL_COMMUNITY): Payer: Self-pay | Admitting: Orthopedic Surgery

## 2020-11-12 ENCOUNTER — Ambulatory Visit (INDEPENDENT_AMBULATORY_CARE_PROVIDER_SITE_OTHER): Payer: BC Managed Care – PPO | Admitting: Orthopedic Surgery

## 2020-11-12 ENCOUNTER — Encounter: Payer: Self-pay | Admitting: Orthopedic Surgery

## 2020-11-12 ENCOUNTER — Other Ambulatory Visit: Payer: Self-pay

## 2020-11-12 VITALS — Ht 70.0 in | Wt 310.0 lb

## 2020-11-12 DIAGNOSIS — S93432D Sprain of tibiofibular ligament of left ankle, subsequent encounter: Secondary | ICD-10-CM

## 2020-11-12 MED ORDER — HYDROCODONE-ACETAMINOPHEN 10-325 MG PO TABS: 1.0000 | ORAL_TABLET | Freq: Four times a day (QID) | ORAL | 0 refills | Status: DC | PRN

## 2020-11-12 MED ORDER — IBUPROFEN 800 MG PO TABS: 800.0000 mg | ORAL_TABLET | Freq: Three times a day (TID) | ORAL | 1 refills | Status: DC | PRN

## 2020-11-12 NOTE — Patient Instructions (Signed)
Return to work on the 18th if accommodations can be made for wheelchair and protective boot

## 2020-11-12 NOTE — Progress Notes (Signed)
POST OP VISIT 1   Encounter Diagnosis  Name Primary?  . Syndesmotic disruption of left ankle, subsequent encounter (repair 11/07/20) Yes     DOS 11/07/2020  PRE-OPERATIVE DIAGNOSIS:  Left ankle Syndesmosis repair and medial deltoid repair  POST-OPERATIVE DIAGNOSIS:  Left ankle Syndesmosis repair and medial deltoid repair  PROCEDURE:  Procedure(s): SYNDESMOSIS REPAIR AND MEDIAL DELTOID REPAIR LEFT ANKLE (Left)   3 suture anchors medially: 1 3.5 corkscrew and 2 DX FIBERTAK loaded w/ suture tape , 1.29mm  2, tight ropes and 2 hole plate laterally   His wounds look good his x-ray from postop looks good we will see him again next Tuesday to take his staples out and take an x-ray  He could return to work if they can accommodate him on the 18th with wheelchair and cam walker  Meds ordered this encounter  Medications  . HYDROcodone-acetaminophen (NORCO) 10-325 MG tablet    Sig: Take 1 tablet by mouth every 6 (six) hours as needed.    Dispense:  30 tablet    Refill:  0  . ibuprofen (ADVIL) 800 MG tablet    Sig: Take 1 tablet (800 mg total) by mouth every 8 (eight) hours as needed.    Dispense:  90 tablet    Refill:  1

## 2020-11-18 ENCOUNTER — Other Ambulatory Visit: Payer: Self-pay

## 2020-11-18 ENCOUNTER — Encounter: Payer: Self-pay | Admitting: Orthopedic Surgery

## 2020-11-18 ENCOUNTER — Ambulatory Visit: Payer: BC Managed Care – PPO

## 2020-11-18 ENCOUNTER — Ambulatory Visit (INDEPENDENT_AMBULATORY_CARE_PROVIDER_SITE_OTHER): Payer: BC Managed Care – PPO | Admitting: Orthopedic Surgery

## 2020-11-18 DIAGNOSIS — S93432D Sprain of tibiofibular ligament of left ankle, subsequent encounter: Secondary | ICD-10-CM

## 2020-11-18 NOTE — Progress Notes (Signed)
Chief Complaint  Patient presents with  . Post-op Follow-up    Left ankle syndesmosis repair 11/07/20 weight bearing with crutches and part of boot only (front part has been removed)    Date of surgery November 07, 2020  Postop day 11.  Status post medial deltoid ligament repair with suture anchors, ORIF left ankle with Arthrex tight rope with a buttress plate  Suture line medial and laterally look improved but should wait a couple days to get the staples out  X-ray today shows maintenance of reduction in ankle mortise  Plan  Protected weightbearing with crutches and walking boot  First follow-up will be on the 14th for staple removal and then on 12 May for x-ray  Encounter Diagnosis  Name Primary?  . Syndesmotic disruption of left ankle, subsequent encounter (repair 11/07/20) Yes

## 2020-11-20 ENCOUNTER — Ambulatory Visit (INDEPENDENT_AMBULATORY_CARE_PROVIDER_SITE_OTHER): Payer: BC Managed Care – PPO | Admitting: Orthopaedic Surgery

## 2020-11-20 ENCOUNTER — Other Ambulatory Visit: Payer: Self-pay

## 2020-11-20 ENCOUNTER — Encounter: Payer: Self-pay | Admitting: Orthopaedic Surgery

## 2020-11-20 VITALS — BP 179/104 | HR 79 | Ht 70.0 in | Wt 310.0 lb

## 2020-11-20 DIAGNOSIS — S93432D Sprain of tibiofibular ligament of left ankle, subsequent encounter: Secondary | ICD-10-CM

## 2020-11-20 NOTE — Progress Notes (Signed)
I am OK  He is a patient of Dr. Romeo Apple who had recent surgery.  He is feeling well.  He is using his crutches and CAM walker.   Wounds of the left ankle look good.  No redness, no discharge.  NV intact.  He has slight edema.  Encounter Diagnosis  Name Primary?  . Syndesmotic disruption of left ankle, subsequent encounter (repair 11/07/20) Yes   Staples removed.  Return in one month. See Dr. Romeo Apple.  X-rays then.  Call if any problem.  Precautions discussed.   Electronically Signed Darreld Mclean, MD 4/14/20228:14 AM

## 2020-12-18 ENCOUNTER — Encounter: Payer: Self-pay | Admitting: Orthopedic Surgery

## 2020-12-18 ENCOUNTER — Other Ambulatory Visit: Payer: Self-pay

## 2020-12-18 ENCOUNTER — Ambulatory Visit: Payer: BC Managed Care – PPO

## 2020-12-18 ENCOUNTER — Ambulatory Visit (INDEPENDENT_AMBULATORY_CARE_PROVIDER_SITE_OTHER): Payer: BC Managed Care – PPO | Admitting: Orthopedic Surgery

## 2020-12-18 DIAGNOSIS — S93432D Sprain of tibiofibular ligament of left ankle, subsequent encounter: Secondary | ICD-10-CM | POA: Diagnosis not present

## 2020-12-18 NOTE — Progress Notes (Signed)
POST OP   Chief Complaint  Patient presents with  . Post-op Follow-up    Left ankle 11/07/20    Encounter Diagnosis  Name Primary?  . Syndesmotic disruption of left ankle, subsequent encounter (repair 11/07/20) Yes     41 DAYS AFTER SURGERY   PRE-OPERATIVE DIAGNOSIS:  Left ankle Syndesmosis repair and medial deltoid repair  POST-OPERATIVE DIAGNOSIS:  Left ankle Syndesmosis repair and medial deltoid repair  PROCEDURE:  Procedure(s): SYNDESMOSIS REPAIR AND MEDIAL DELTOID REPAIR LEFT ANKLE (Left)   3 suture anchors medially: 1 3.5 corkscrew and 2 DX FIBERTAK loaded w/ suture tape , 1.42mm  2, tight ropes and 2 hole plate laterally   HES SOB SINCE Sunday NO FEVER   (WE ASKED HIM TO REACH OUT TO PRIMARY CARE)  XRAYS SHOW STABLE FIXATION WITH TIGHTROPE   ANKLE EDEMA DISTALLY CALF NON TENDER NO LEG SWELLING   WBAT IN CAM WALKER 4 MORE WEEKS OK TO STOP USING CRUTCHES

## 2020-12-19 DIAGNOSIS — R000 Tachycardia, unspecified: Secondary | ICD-10-CM | POA: Diagnosis not present

## 2020-12-19 DIAGNOSIS — S93432A Sprain of tibiofibular ligament of left ankle, initial encounter: Secondary | ICD-10-CM | POA: Diagnosis not present

## 2020-12-19 DIAGNOSIS — G4733 Obstructive sleep apnea (adult) (pediatric): Secondary | ICD-10-CM | POA: Diagnosis not present

## 2020-12-19 DIAGNOSIS — I10 Essential (primary) hypertension: Secondary | ICD-10-CM | POA: Diagnosis not present

## 2020-12-19 DIAGNOSIS — R0609 Other forms of dyspnea: Secondary | ICD-10-CM | POA: Diagnosis not present

## 2021-01-15 ENCOUNTER — Other Ambulatory Visit: Payer: Self-pay

## 2021-01-15 ENCOUNTER — Ambulatory Visit: Payer: BC Managed Care – PPO

## 2021-01-15 ENCOUNTER — Encounter: Payer: Self-pay | Admitting: Orthopedic Surgery

## 2021-01-15 ENCOUNTER — Ambulatory Visit (INDEPENDENT_AMBULATORY_CARE_PROVIDER_SITE_OTHER): Payer: BC Managed Care – PPO | Admitting: Orthopedic Surgery

## 2021-01-15 DIAGNOSIS — S93432D Sprain of tibiofibular ligament of left ankle, subsequent encounter: Secondary | ICD-10-CM

## 2021-01-15 NOTE — Progress Notes (Signed)
Chief Complaint  Patient presents with   Post-op Follow-up    11/07/20 Left ankle ORIF Syndesmosis repair   Leonette Most continues to progress well he has no pain in his left ankle but his foot is still swollen.  His x-ray looks good and he can go into a regular shoe as soon as he can get the swelling down.  It is okay for him to start some exercises on his elliptical some tiptoe raises and I advised that he soak the foot in Epson salt  Follow-up in 1 month  Encounter Diagnosis  Name Primary?   Syndesmotic disruption of left ankle, subsequent encounter (repair 11/07/20) Yes

## 2021-02-05 ENCOUNTER — Ambulatory Visit (HOSPITAL_COMMUNITY)
Admission: RE | Admit: 2021-02-05 | Discharge: 2021-02-05 | Disposition: A | Discharge status: Home / Self Care | Payer: BC Managed Care – PPO | Source: Ambulatory Visit | Attending: Family Medicine | Admitting: Family Medicine

## 2021-02-05 ENCOUNTER — Other Ambulatory Visit: Payer: Self-pay

## 2021-02-05 ENCOUNTER — Other Ambulatory Visit (HOSPITAL_COMMUNITY): Payer: Self-pay | Admitting: Family Medicine

## 2021-02-05 ENCOUNTER — Other Ambulatory Visit: Payer: Self-pay | Admitting: Family Medicine

## 2021-02-05 DIAGNOSIS — R2242 Localized swelling, mass and lump, left lower limb: Secondary | ICD-10-CM | POA: Diagnosis present

## 2021-02-16 ENCOUNTER — Encounter: Payer: BC Managed Care – PPO | Admitting: Orthopedic Surgery

## 2021-06-22 ENCOUNTER — Telehealth: Payer: Self-pay | Admitting: Radiology

## 2021-06-22 NOTE — Telephone Encounter (Signed)
Patient called LM asking for a call back, I called NA so I left message for him to call us back.

## 2021-07-06 ENCOUNTER — Ambulatory Visit: Payer: Self-pay | Admitting: Orthopedic Surgery

## 2022-03-03 ENCOUNTER — Inpatient Hospital Stay (HOSPITAL_COMMUNITY)
Admission: EM | Admit: 2022-03-03 | Discharge: 2022-03-05 | DRG: 308 | Disposition: A | Discharge status: Home / Self Care | Payer: No Typology Code available for payment source | Attending: Internal Medicine | Admitting: Internal Medicine

## 2022-03-03 ENCOUNTER — Emergency Department (HOSPITAL_COMMUNITY): Payer: No Typology Code available for payment source

## 2022-03-03 ENCOUNTER — Other Ambulatory Visit: Payer: Self-pay

## 2022-03-03 ENCOUNTER — Encounter (HOSPITAL_COMMUNITY): Payer: Self-pay | Admitting: Emergency Medicine

## 2022-03-03 DIAGNOSIS — Z560 Unemployment, unspecified: Secondary | ICD-10-CM | POA: Diagnosis not present

## 2022-03-03 DIAGNOSIS — Z7984 Long term (current) use of oral hypoglycemic drugs: Secondary | ICD-10-CM | POA: Diagnosis not present

## 2022-03-03 DIAGNOSIS — Z6841 Body Mass Index (BMI) 40.0 and over, adult: Secondary | ICD-10-CM | POA: Diagnosis not present

## 2022-03-03 DIAGNOSIS — I5031 Acute diastolic (congestive) heart failure: Secondary | ICD-10-CM | POA: Diagnosis present

## 2022-03-03 DIAGNOSIS — I480 Paroxysmal atrial fibrillation: Principal | ICD-10-CM | POA: Diagnosis present

## 2022-03-03 DIAGNOSIS — I080 Rheumatic disorders of both mitral and aortic valves: Secondary | ICD-10-CM | POA: Diagnosis not present

## 2022-03-03 DIAGNOSIS — Z794 Long term (current) use of insulin: Secondary | ICD-10-CM | POA: Diagnosis not present

## 2022-03-03 DIAGNOSIS — Z91199 Patient's noncompliance with other medical treatment and regimen due to unspecified reason: Secondary | ICD-10-CM | POA: Diagnosis not present

## 2022-03-03 DIAGNOSIS — G4733 Obstructive sleep apnea (adult) (pediatric): Secondary | ICD-10-CM | POA: Diagnosis present

## 2022-03-03 DIAGNOSIS — E66813 Obesity, class 3: Secondary | ICD-10-CM

## 2022-03-03 DIAGNOSIS — I340 Nonrheumatic mitral (valve) insufficiency: Secondary | ICD-10-CM | POA: Diagnosis not present

## 2022-03-03 DIAGNOSIS — E1169 Type 2 diabetes mellitus with other specified complication: Secondary | ICD-10-CM

## 2022-03-03 DIAGNOSIS — I10 Essential (primary) hypertension: Secondary | ICD-10-CM | POA: Diagnosis not present

## 2022-03-03 DIAGNOSIS — I4891 Unspecified atrial fibrillation: Secondary | ICD-10-CM | POA: Diagnosis present

## 2022-03-03 DIAGNOSIS — E1165 Type 2 diabetes mellitus with hyperglycemia: Secondary | ICD-10-CM | POA: Diagnosis present

## 2022-03-03 DIAGNOSIS — I110 Hypertensive heart disease with heart failure: Secondary | ICD-10-CM | POA: Diagnosis present

## 2022-03-03 DIAGNOSIS — E119 Type 2 diabetes mellitus without complications: Secondary | ICD-10-CM

## 2022-03-03 LAB — CBC WITH DIFFERENTIAL/PLATELET
Abs Immature Granulocytes: 0.08 10*3/uL — ABNORMAL HIGH (ref 0.00–0.07)
Basophils Absolute: 0.1 10*3/uL (ref 0.0–0.1)
Basophils Relative: 1 %
Eosinophils Absolute: 0.1 10*3/uL (ref 0.0–0.5)
Eosinophils Relative: 1 %
HCT: 44.9 % (ref 39.0–52.0)
Hemoglobin: 15.8 g/dL (ref 13.0–17.0)
Immature Granulocytes: 1 %
Lymphocytes Relative: 14 %
Lymphs Abs: 1.3 10*3/uL (ref 0.7–4.0)
MCH: 31.5 pg (ref 26.0–34.0)
MCHC: 35.2 g/dL (ref 30.0–36.0)
MCV: 89.4 fL (ref 80.0–100.0)
Monocytes Absolute: 0.9 10*3/uL (ref 0.1–1.0)
Monocytes Relative: 9 %
Neutro Abs: 7.4 10*3/uL (ref 1.7–7.7)
Neutrophils Relative %: 74 %
Platelets: 192 10*3/uL (ref 150–400)
RBC: 5.02 MIL/uL (ref 4.22–5.81)
RDW: 12.5 % (ref 11.5–15.5)
WBC: 9.9 10*3/uL (ref 4.0–10.5)
nRBC: 0 % (ref 0.0–0.2)

## 2022-03-03 LAB — GLUCOSE, CAPILLARY
Glucose-Capillary: 398 mg/dL — ABNORMAL HIGH (ref 70–99)
Glucose-Capillary: 331 mg/dL — ABNORMAL HIGH (ref 70–99)

## 2022-03-03 LAB — BASIC METABOLIC PANEL WITH GFR
Anion gap: 10 (ref 5–15)
BUN: 14 mg/dL (ref 6–20)
CO2: 25 mmol/L (ref 22–32)
Calcium: 9 mg/dL (ref 8.9–10.3)
Chloride: 100 mmol/L (ref 98–111)
Creatinine, Ser: 0.9 mg/dL (ref 0.61–1.24)
GFR, Estimated: >60 mL/min
Glucose, Bld: 388 mg/dL — ABNORMAL HIGH (ref 70–99)
Potassium: 4.4 mmol/L (ref 3.5–5.1)
Sodium: 135 mmol/L (ref 135–145)

## 2022-03-03 LAB — MAGNESIUM: Magnesium: 1.9 mg/dL (ref 1.7–2.4)

## 2022-03-03 LAB — TROPONIN I (HIGH SENSITIVITY)
Troponin I (High Sensitivity): 11 ng/L
Troponin I (High Sensitivity): 11 ng/L

## 2022-03-03 LAB — MRSA NEXT GEN BY PCR, NASAL: MRSA by PCR Next Gen: NOT DETECTED

## 2022-03-03 LAB — HEMOGLOBIN A1C
Hgb A1c MFr Bld: 11 % — ABNORMAL HIGH (ref 4.8–5.6)
Mean Plasma Glucose: 269 mg/dL

## 2022-03-03 LAB — HIV ANTIBODY (ROUTINE TESTING W REFLEX): HIV Screen 4th Generation wRfx: NONREACTIVE

## 2022-03-03 MED ORDER — APIXABAN 5 MG PO TABS
5.0000 mg | ORAL_TABLET | Freq: Once | ORAL | Status: AC
  Administered 2022-03-03: 5 mg via ORAL
  Filled 2022-03-03: qty 1

## 2022-03-03 MED ORDER — CHLORHEXIDINE GLUCONATE CLOTH 2 % EX PADS
6.0000 | MEDICATED_PAD | Freq: Every day | CUTANEOUS | Status: DC
  Administered 2022-03-03 – 2022-03-05 (×3): 6 via TOPICAL

## 2022-03-03 MED ORDER — APIXABAN 5 MG PO TABS
5.0000 mg | ORAL_TABLET | Freq: Two times a day (BID) | ORAL | Status: DC
  Administered 2022-03-03 – 2022-03-05 (×4): 5 mg via ORAL
  Filled 2022-03-03 (×4): qty 1

## 2022-03-03 MED ORDER — ONDANSETRON HCL 4 MG/2ML IJ SOLN: 4.0000 mg | Freq: Four times a day (QID) | INTRAMUSCULAR | Status: DC | PRN

## 2022-03-03 MED ORDER — LIVING WELL WITH DIABETES BOOK
Freq: Once | Status: AC
  Administered 2022-03-03: 1

## 2022-03-03 MED ORDER — DILTIAZEM HCL 25 MG/5ML IV SOLN
10.0000 mg | Freq: Once | INTRAVENOUS | Status: DC
Start: 2022-03-03 — End: 2022-03-03

## 2022-03-03 MED ORDER — ORAL CARE MOUTH RINSE: 15.0000 mL | OROMUCOSAL | Status: DC | PRN

## 2022-03-03 MED ORDER — ONDANSETRON HCL 4 MG PO TABS: 4.0000 mg | ORAL_TABLET | Freq: Four times a day (QID) | ORAL | Status: DC | PRN

## 2022-03-03 MED ORDER — TELMISARTAN-HCTZ 80-12.5 MG PO TABS
1.0000 | ORAL_TABLET | Freq: Every day | ORAL | Status: DC
Start: 2022-03-03 — End: 2022-03-03

## 2022-03-03 MED ORDER — MELATONIN 3 MG PO TABS
6.0000 mg | ORAL_TABLET | Freq: Once | ORAL | Status: AC
  Administered 2022-03-03: 6 mg via ORAL
  Filled 2022-03-03: qty 2

## 2022-03-03 MED ORDER — METOPROLOL TARTRATE 50 MG PO TABS
50.0000 mg | ORAL_TABLET | Freq: Once | ORAL | Status: AC
  Administered 2022-03-03: 50 mg via ORAL
  Filled 2022-03-03: qty 1

## 2022-03-03 MED ORDER — INSULIN ASPART 100 UNIT/ML IJ SOLN
0.0000 [IU] | Freq: Every day | INTRAMUSCULAR | Status: DC
  Administered 2022-03-03: 4 [IU] via SUBCUTANEOUS
  Administered 2022-03-04: 3 [IU] via SUBCUTANEOUS

## 2022-03-03 MED ORDER — HYDROCHLOROTHIAZIDE 12.5 MG PO TABS
12.5000 mg | ORAL_TABLET | Freq: Every day | ORAL | Status: DC
  Administered 2022-03-03 – 2022-03-04 (×2): 12.5 mg via ORAL
  Filled 2022-03-03 (×2): qty 1

## 2022-03-03 MED ORDER — DILTIAZEM HCL-DEXTROSE 125-5 MG/125ML-% IV SOLN (PREMIX)
5.0000 mg/h | INTRAVENOUS | Status: DC
  Administered 2022-03-03: 10 mg/h via INTRAVENOUS
  Administered 2022-03-03: 5 mg/h via INTRAVENOUS
  Administered 2022-03-04: 15 mg/h via INTRAVENOUS
  Administered 2022-03-04: 10 mg/h via INTRAVENOUS
  Filled 2022-03-03 (×4): qty 125

## 2022-03-03 MED ORDER — SODIUM CHLORIDE 0.9 % IV BOLUS
1000.0000 mL | Freq: Once | INTRAVENOUS | Status: AC
  Administered 2022-03-03: 1000 mL via INTRAVENOUS

## 2022-03-03 MED ORDER — ACETAMINOPHEN 325 MG PO TABS: 650.0000 mg | ORAL_TABLET | Freq: Four times a day (QID) | ORAL | Status: DC | PRN

## 2022-03-03 MED ORDER — ACETAMINOPHEN 650 MG RE SUPP: 650.0000 mg | Freq: Four times a day (QID) | RECTAL | Status: DC | PRN

## 2022-03-03 MED ORDER — DILTIAZEM HCL-DEXTROSE 125-5 MG/125ML-% IV SOLN (PREMIX)
INTRAVENOUS | Status: AC
  Filled 2022-03-03: qty 125

## 2022-03-03 MED ORDER — DILTIAZEM LOAD VIA INFUSION
10.0000 mg | Freq: Once | INTRAVENOUS | Status: AC
  Administered 2022-03-03: 10 mg via INTRAVENOUS
  Filled 2022-03-03: qty 10

## 2022-03-03 MED ORDER — IRBESARTAN 150 MG PO TABS
300.0000 mg | ORAL_TABLET | Freq: Every day | ORAL | Status: DC
  Administered 2022-03-03 – 2022-03-05 (×3): 300 mg via ORAL
  Filled 2022-03-03 (×3): qty 2

## 2022-03-03 MED ORDER — METOPROLOL TARTRATE 50 MG PO TABS
50.0000 mg | ORAL_TABLET | Freq: Two times a day (BID) | ORAL | Status: DC
  Administered 2022-03-03: 50 mg via ORAL
  Filled 2022-03-03: qty 1

## 2022-03-03 MED ORDER — INSULIN ASPART 100 UNIT/ML IJ SOLN
0.0000 [IU] | Freq: Three times a day (TID) | INTRAMUSCULAR | Status: DC
  Administered 2022-03-03 – 2022-03-04 (×2): 20 [IU] via SUBCUTANEOUS
  Administered 2022-03-04: 15 [IU] via SUBCUTANEOUS
  Administered 2022-03-04: 20 [IU] via SUBCUTANEOUS
  Administered 2022-03-05 (×2): 11 [IU] via SUBCUTANEOUS

## 2022-03-03 NOTE — Progress Notes (Signed)
Started some patient teaching on how to give subcutaneous insulin. Pt wanting to watch the first couple administrations before he tries to draw up and administer any hisself.

## 2022-03-03 NOTE — Consult Note (Signed)
CARDIOLOGY CONSULT NOTE       Patient ID: Kenneth Bridges MRN: [id number] DOB/AGE: 1966-01-09 56 y.o.  Admit date: 03/03/2022 Referring Physician: Berle Mull PA  Primary Physician: [facility] Primary Cardiologist: new Reason for Consultation: Afib  Active Problems:   * No active hospital problems. *   HPI:  56 y.o. seen in [facility] ED for new onset rapid Afib. Has had fatigue and dyspnea since beginning of July Has not had SSCP or palpitations and has not noted high HR. CRF;s DM, HTN. Has obesity and OSA not wearing his CPAP.  Drinks a 6 pack of beer only on weekends Was recently in [place] living in RV looking for work in [employer] business Came back in May and is unemployed Currently with stable vitals and comfortable on cardizem drip with rates 150 bpm range ECG with fib/flutter No contraindications to anticoagulation/DOAC although he is unemployed and cost may be and issue CHADVASC 2 for DM and HTN   ROS All other systems reviewed and negative except as noted above  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)   Sleep apnea    Wears glasses     Family History  Problem Relation Age of Onset   Cancer Mother    Colon cancer Neg Hx    Colon polyps Neg Hx     Social History   Socioeconomic History   Marital status: Married    Spouse name: Not on file   Number of children: Not on file   Years of education: Not on file   Highest education level: Not on file  Occupational History   Not on file  Tobacco Use   Smoking status: Never   Smokeless tobacco: Never  Vaping Use   Vaping Use: Never used  Substance and Sexual Activity   Alcohol use: Yes    Comment: social beer drinker on the weekends (drinks 2 beers on Friday and 2 beers on Saturday)    Drug use: No   Sexual activity: Not on file  Other Topics Concern   Not on  file  Social History Narrative   Not on file   Social Determinants of Health   Financial Resource Strain: Not on file  Food Insecurity: Not on file  Transportation Needs: Not on file  Physical Activity: Not on file  Stress: Not on file  Social Connections: Not on file  Intimate Partner Violence: Not on file    Past Surgical History:  Procedure Laterality Date   ANKLE FRACTURE SURGERY Left 1988   COLONOSCOPY N/A 05/19/2016   Procedure: COLONOSCOPY;  Surgeon: Corbin Ade, MD;  Location: [facility];  Service: Endoscopy;  Laterality: N/A;  9:30 am   HERNIA REPAIR Left    inguinal   KNEE ARTHROSCOPY WITH MEDIAL MENISECTOMY Right 08/28/2019   Procedure: KNEE ARTHROSCOPY WITH MEDIAL MENISCECTOMY;  Surgeon: Vickki Hearing, MD;  Location: [facility];  Service: Orthopedics;  Laterality: Right;   NASAL SEPTOPLASTY W/ TURBINOPLASTY  02/13/2016   NASAL SEPTOPLASTY WITH TURBINATE REDUCTION (Bilateral)   NASAL SEPTOPLASTY W/ TURBINOPLASTY Bilateral 02/13/2016   Procedure: NASAL SEPTOPLASTY WITH TURBINATE REDUCTION;  Surgeon: Christia Reading, MD;  Location: [facility] OR;  Service: ENT;  Laterality: Bilateral;   SYNDESMOSIS REPAIR Left 11/07/2020   Procedure: SYNDESMOSIS REPAIR AND MEDIAL DELTOID REPAIR LEFT ANKLE;  Surgeon: Vickki Hearing, MD;  Location: [facility];  Service: Orthopedics;  Laterality: Left;      Current Facility-Administered Medications:    [COMPLETED] diltiazem (CARDIZEM) 1 mg/mL load via infusion 10 mg, 10 mg, Intravenous, Once, 10 mg at 03/03/22 1000 **AND** diltiazem (CARDIZEM) 125 mg in dextrose 5% 125 mL (1 mg/mL) infusion, 5-15 mg/hr, Intravenous, Continuous, Carroll Sage, PA-C, Last Rate: 7.5 mL/hr at 03/03/22 1046, 7.5 mg/hr at 03/03/22 1046   dilTIAZem HCl-Dextrose 125-5 MG/125ML-% infusion, , , ,   Current Outpatient Medications:    metFORMIN (GLUCOPHAGE) 500 MG tablet, Take 500 mg by mouth 2 (two) times daily with a meal. (Patient not taking: Reported on 03/03/2022),  Disp: , Rfl:    sildenafil (VIAGRA) 100 MG tablet, Take by mouth. (Patient not taking: Reported on 03/03/2022), Disp: , Rfl:    telmisartan-hydrochlorothiazide (MICARDIS HCT) 80-12.5 MG tablet, Take 1 tablet by mouth daily. (Patient not taking: Reported on 03/03/2022), Disp: , Rfl:   dilTIAZem HCl-Dextrose        diltiazem (CARDIZEM) infusion 7.5 mg/hr (03/03/22 1046)    Physical Exam: Blood pressure 113/85, pulse 78, resp. rate 15, height 5\' 10"  (1.778 m), weight 134.3 kg, SpO2 96 %.    Affect appropriate Overweight male  HEENT: normal Neck supple with no adenopathy JVP normal no bruits no thyromegaly Lungs clear with no wheezing and good diaphragmatic motion Heart:  S1/S2 no murmur, no rub, gallop or click PMI normal Abdomen: benighn, BS positve, no tenderness, no AAA no bruit.  No HSM or HJR Distal pulses intact with no bruits No edema Neuro non-focal Skin warm and dry No muscular weakness   Labs:   Lab Results  Component Value Date   WBC 9.9 03/03/2022   HGB 15.8 03/03/2022   HCT 44.9 03/03/2022   MCV 89.4 03/03/2022   PLT 192 03/03/2022    Recent Labs  Lab 03/03/22 0955  NA 135  K 4.4  CL 100  CO2 25  BUN 14  CREATININE 0.90  CALCIUM 9.0  GLUCOSE 388*   Lab Results  Component Value Date   TROPONINI <0.03 05/22/2016   No results found for: "CHOL" No results found for: "HDL" No results found for: "LDLCALC" No results found for: "TRIG" No results found for: "CHOLHDL" No results found for: "LDLDIRECT"    Radiology: [facility] Chest Port 1 View  Result Date: 03/03/2022 CLINICAL DATA:  56 year old male with history of fatigue and shortness of breath for the past 2 weeks. EXAM: PORTABLE CHEST 1 VIEW COMPARISON:  Chest x-ray 11/19/2013. FINDINGS: Ill-defined opacity in the base of the left hemithorax obscuring the left hemidiaphragm in the left costophrenic sulcus. Diffuse interstitial prominence and peribronchial cuffing bilaterally. No pneumothorax. No evidence of  pulmonary edema. Heart size is borderline enlarged, accentuated by portable AP technique. Prominent soft tissue in the paratracheal regions bilaterally. IMPRESSION: 1. Ill-defined opacity at the left base which may reflect atelectasis and/or consolidation with superimposed small left pleural effusion. 2. Diffuse peribronchial cuffing, concerning for an acute bronchitis. 3. Prominence of paratracheal soft tissues bilaterally concerning for potential lymphadenopathy. Follow-up contrast enhanced chest CT is suggested in the near future to better evaluate these findings. Electronically Signed   By: 11/21/2013 M.D.   On: 03/03/2022 10:13    EKG: rapid fib/flutter no acute ST changes    ASSESSMENT AND PLAN:   PAF:  ? Related to not wearing CPAP, HTN and ETOH. Continue cardizem drip Start lopressor 50 mg oral bid. Start eliquis 5 mg bid. TTE to assess  EF. Suspect with prolonged rapid afib may be down. If EF down consider TEE/DCC If not can anticoagulate and rate control for 3 weeks and then do [facility] Check TSH/T4 OSA:  discussed weight loss and compliance with CPAP HTN:  Well controlled.  Continue current medications and low sodium Dash type diet.   DM:  Discussed low carb diet.  Target hemoglobin A1c is 6.5 or less.  Continue current medications.    SignedJenkins Rouge 03/03/2022, 11:09 AM

## 2022-03-03 NOTE — ED Triage Notes (Signed)
Pt to the ED with complaints of shortness of breath with fatigue for the past two weeks.  Pt denies pain and any other symptoms.

## 2022-03-03 NOTE — Inpatient Diabetes Management (Signed)
Inpatient Diabetes Program Recommendations  AACE/ADA: New Consensus Statement on Inpatient Glycemic Control   Target Ranges:  Prepandial:   less than 140 mg/dL      Peak postprandial:   less than 180 mg/dL (1-2 hours)      Critically ill patients:  140 - 180 mg/dL    Latest Reference Range & Units 03/03/22 09:55  Glucose 70 - 99 mg/dL 388 (H)    Latest Reference Range & Units 11/05/20 15:49  Hemoglobin A1C 4.8 - 5.6 % 10.0 (H)   Review of Glycemic Control  Diabetes history: DM2 Outpatient Diabetes medications: Metformin 500 mg BID Current orders for Inpatient glycemic control: Novolog 0-20 units TID with meals, Novolog 0-5 units QHS  Inpatient Diabetes Program Recommendations:    HbgA1C: Current A1C in process.  Outpatient: If patient is discharged new to insulin, he would prefer vial/syringe. Will need Rx for glucose monitoring kit (#[id number]).  NOTE: Spoke with patient at bedside about diabetes and home regimen for diabetes control. Patient reports that he has not seen a provider in a while. He notes that he had lost his insurance and just recently got insurance coverage through the [place] 2-3 months ago. Patient reports that he is taking Metformin 500 mg but usually once a day instead of BID just because he is forgetting to take evening dose. Patient does not check glucose and does not have a glucometer at home. Patient was not aware of what normal glucose values are or what an A1C is.   Explained what an A1C is and explained that current A1C in process.  Discussed glucose and A1C goals. Discussed importance of checking CBGs and maintaining good CBG control to prevent long-term and short-term complications. Explained how hyperglycemia leads to damage within blood vessels which lead to the common complications seen with uncontrolled diabetes. Stressed to the patient the importance of improving glycemic control to prevent further complications from uncontrolled diabetes. Discussed impact of  nutrition, exercise, stress, sickness, and medications on diabetes control.  Discussed carbohydrates, carbohydrate goals per day and meal, along with portion sizes. Patient has been drinking diet sodas, water, and had been watching carbohydrate intake. He reports that over the past 2 weeks he was frustrated and feeling depressed and admits that he has been eating cookies, ice cream, and foods that he would not normally be eating. Encouraged patient to get back to following carb modified diet. Informed patient that a Living Well with DM book would be ordered and encouraged patient to read through book once received. Informed patient that I wanted to review insulin in case he was discharged new to insulin. Patient states he would prefer to take oral DM medications but he would take insulin if needed.  Discussed, reviewed, and demonstrated how to use vial/syringe and insulin pens. Patient states that he would prefer the vial/syringe if discharged on insulin.  Explained and demonstrated proper technique on how to draw up insulin with vial and syringe.  Showed patient how to draw up air, inject air into vial, draw up insulin, and inject. Patient demonstrated competency with insulin administration by vial and syringe.  RN to continue working with patient on insulin injections before discharge and allow patient to administer own insulin injections while inpatient.  Encouraged patient to check glucose as advised, take DM medication as prescribed, and to [facility].  Patient verbalized understanding of information discussed and reports no further questions at this time related to diabetes.  Thanks, Barnie Alderman, RN, MSN, CDE Diabetes Coordinator Inpatient Diabetes Program [phone] (Team Pager)

## 2022-03-03 NOTE — ED Provider Notes (Signed)
[facility] Provider Note   CSN: [id number] Arrival date & time: 03/03/22  0857     History  Chief Complaint  Patient presents with   Shortness of Breath    Kenneth Bridges is a 56 y.o. male.  HPI  Medical history including hypertension, sleep apnea, diabetes, presents with complaints of feeling tired and fatigued.  Patient has been going on for the last 3 weeks, states started around 4 July, states that he has been feeling more short of breath especially with exertion, not having any chest pain or pleuritic chest pain denies any peripheral edema.  Patient states that he does feel slightly lightheaded but has never had any syncopal episodes, he states that today while he was brushing teeth he got very tired and had to come in.  He has no history of cardiac abnormalities, no history of PEs or DVTs current not on hormone therapy.  He has no other complaints.  He does note that he is not compliant with his CPAP machine as he states that he cannot breathe while using it.  He has not used in many years.  Home Medications Prior to Admission medications   Medication Sig Start Date End Date Taking? Authorizing Provider  metFORMIN (GLUCOPHAGE) 500 MG tablet Take 500 mg by mouth 2 (two) times daily with a meal. Patient not taking: Reported on 03/03/2022    [provider]  sildenafil (VIAGRA) 100 MG tablet Take by mouth. Patient not taking: Reported on 03/03/2022 12/15/20   [provider]  telmisartan-hydrochlorothiazide (MICARDIS HCT) 80-12.5 MG tablet Take 1 tablet by mouth daily. Patient not taking: Reported on 03/03/2022    [provider]      Allergies    Patient has no known allergies.    Review of Systems   Review of Systems  Constitutional:  Positive for fatigue. Negative for chills and fever.  Respiratory:  Negative for shortness of breath.   Cardiovascular:  Negative for chest pain.  Gastrointestinal:  Negative for abdominal  pain.  Neurological:  Negative for headaches.    Physical Exam Updated Vital Signs BP 113/85   Pulse 78   Resp 15   Ht 5\' 10"  (1.778 m)   Wt 134.3 kg   SpO2 96%   BMI 42.47 kg/m  Physical Exam Vitals and nursing note reviewed.  Constitutional:      General: He is not in acute distress.    Appearance: He is not ill-appearing.  HENT:     Head: Normocephalic and atraumatic.     Nose: No congestion.  Eyes:     Conjunctiva/sclera: Conjunctivae normal.  Cardiovascular:     Rate and Rhythm: Tachycardia present. Rhythm irregular.     Pulses: Normal pulses.     Heart sounds: No murmur heard.    No friction rub. No gallop.  Pulmonary:     Effort: No respiratory distress.     Breath sounds: No wheezing, rhonchi or rales.  Abdominal:     Palpations: Abdomen is soft.     Tenderness: There is no abdominal tenderness. There is no right CVA tenderness or left CVA tenderness.  Musculoskeletal:     Right lower leg: No edema.     Left lower leg: No edema.  Skin:    General: Skin is warm and dry.  Neurological:     Mental Status: He is alert.  Psychiatric:        Mood and Affect: Mood normal.     ED Results /  Procedures / Treatments   Labs (all labs ordered are listed, but only abnormal results are displayed) Labs Reviewed  CBC WITH DIFFERENTIAL/PLATELET - Abnormal; Notable for the following components:      Result Value   Abs Immature Granulocytes 0.08 (*)    All other components within normal limits  BASIC METABOLIC PANEL - Abnormal; Notable for the following components:   Glucose, Bld 388 (*)    All other components within normal limits  MAGNESIUM  TROPONIN I (HIGH SENSITIVITY)  TROPONIN I (HIGH SENSITIVITY)    EKG None  Radiology DG Chest Port 1 View  Result Date: 03/03/2022 CLINICAL DATA:  56 year old male with history of fatigue and shortness of breath for the past 2 weeks. EXAM: PORTABLE CHEST 1 VIEW COMPARISON:  Chest x-ray 11/19/2013. FINDINGS: Ill-defined  opacity in the base of the left hemithorax obscuring the left hemidiaphragm in the left costophrenic sulcus. Diffuse interstitial prominence and peribronchial cuffing bilaterally. No pneumothorax. No evidence of pulmonary edema. Heart size is borderline enlarged, accentuated by portable AP technique. Prominent soft tissue in the paratracheal regions bilaterally. IMPRESSION: 1. Ill-defined opacity at the left base which may reflect atelectasis and/or consolidation with superimposed small left pleural effusion. 2. Diffuse peribronchial cuffing, concerning for an acute bronchitis. 3. Prominence of paratracheal soft tissues bilaterally concerning for potential lymphadenopathy. Follow-up contrast enhanced chest CT is suggested in the near future to better evaluate these findings. Electronically Signed   By: Trudie Reed M.D.   On: 03/03/2022 10:13    Procedures .Critical Care  Performed by: Carroll Sage, PA-C Authorized by: Carroll Sage, PA-C   Critical care provider statement:    Critical care time (minutes):  60   Critical care time was exclusive of:  Separately billable procedures and treating other patients   Critical care was necessary to treat or prevent imminent or life-threatening deterioration of the following conditions:  Cardiac failure   Critical care was time spent personally by me on the following activities:  Development of treatment plan with patient or surrogate, discussions with consultants, evaluation of patient's response to treatment, examination of patient, ordering and review of laboratory studies, ordering and review of radiographic studies, ordering and performing treatments and interventions, pulse oximetry, re-evaluation of patient's condition and review of old charts   I assumed direction of critical care for this patient from another provider in my specialty: no     Care discussed with: admitting provider       Medications Ordered in ED Medications   diltiazem (CARDIZEM) 1 mg/mL load via infusion 10 mg (10 mg Intravenous Bolus from Bag 03/03/22 1000)    And  diltiazem (CARDIZEM) 125 mg in dextrose 5% 125 mL (1 mg/mL) infusion (7.5 mg/hr Intravenous Rate/Dose Change 03/03/22 1046)  dilTIAZem HCl-Dextrose 125-5 MG/125ML-% infusion (  Not Given 03/03/22 1009)  metoprolol tartrate (LOPRESSOR) tablet 50 mg (has no administration in time range)  apixaban (ELIQUIS) tablet 5 mg (has no administration in time range)  sodium chloride 0.9 % bolus 1,000 mL (1,000 mLs Intravenous New Bag/Given 03/03/22 1000)  apixaban (ELIQUIS) tablet 5 mg (5 mg Oral Given 03/03/22 1109)  metoprolol tartrate (LOPRESSOR) tablet 50 mg (50 mg Oral Given 03/03/22 1109)    ED Course/ Medical Decision Making/ A&P                           Medical Decision Making Amount and/or Complexity of Data Reviewed Labs: ordered. Radiology: ordered.  Risk  Prescription drug management. Decision regarding hospitalization.   This patient presents to the ED for concern of fatigue, this involves an extensive number of treatment options, and is a complaint that carries with it a high risk of complications and morbidity.  The differential diagnosis includes PE, arrhythmia, ACS, electrode derailments    Additional history obtained:  Additional history obtained from N/A External records from outside source obtained and reviewed including PCP notes   Co morbidities that complicate the patient evaluation  Hypertension  Social Determinants of Health:  N/A    Lab Tests:  I Ordered, and personally interpreted labs.  The pertinent results include: CBC unremarkable, BMP shows glucose of 388, for troponin is 11   Imaging Studies ordered:  I ordered imaging studies including chest x-ray I independently visualized and interpreted imaging which showed shows atelectasis or consolidation left lower lung with possible pleural effusion, as well as peribronchial cuffing. I agree with  the radiologist interpretation   Cardiac Monitoring:  The patient was maintained on a cardiac monitor.  I personally viewed and interpreted the cardiac monitored which showed an underlying rhythm of: A-fib with RVR   Medicines ordered and prescription drug management:  I ordered medication including fluids I have reviewed the patients home medicines and have made adjustments as needed  Critical Interventions:  Patient is noted in A-fib with RVR, he is hemodynamically stable, will start him on diltiazem drip    Reevaluation:  Reassessed after diltiazem drip was initiated, resting comfortably, heart rate still remains in the 140s, hemodynamically stable we will continue to monitor.  Reassessed the patient after first bolus of Cardizem as well as drip, BP has remained stable but heart rate has remained essentially unchanged, will Cardizem consult cardiology for further recommendations.  Updated patient recommendation of cardiology agreed this plan will admit the patient.   Consultations Obtained:  I requested consultation with the Dr. Asa Lente of cardiology,  and discussed lab and imaging findings as well as pertinent plan - they recommend: Hospital admission, provide him with dose of Lopressor oral, started on Eliquis. Spoke with Dr. Donneta Romberg hospitalist team will admit the patient    Test Considered:  N/A    Rule out I have low suspicion for ACS as history is atypical, patient has no cardiac history, EKG  without signs of ischemia, patient first troponin is negative section troponin pending.  Low suspicion for PE as patient denies pleuritic chest pain, shortness of breath, patient denies leg pain, no pedal edema noted on exam, vital signs reassuring nontachypneic nonhypoxic.  low suspicion for AAA or aortic dissection as history is atypical, patient has low risk factors.  Low suspicion for systemic infection as patient is nontoxic-appearing, vital signs reassuring, no obvious  source infection noted on exam.  X-ray reveals possible consolidation versus atelectasis, I suspect this is more atelectasis as he is endorse any cough or congestion lung sounds are clear bilaterally nontoxic-appearing.     Dispostion and problem list  After consideration of the diagnostic results and the patients response to treatment, I feel that the patent would benefit from admission.  A-fib RVR-likely source of patient's fatigue, patient is currently on Cardizem, given dose of Lopressor, will need further management.  And formal consultation by cardiology.            Final Clinical Impression(s) / ED Diagnoses Final diagnoses:  Atrial fibrillation, unspecified type ([place])    Rx / DC Orders ED Discharge Orders     None  Carroll Sage, PA-C 03/03/22 1137    Bethann Berkshire, MD 03/04/22 [phone]

## 2022-03-03 NOTE — H&P (Signed)
History and Physical    Kenneth Bridges [id number] DOB: 1966-05-12 DOA: 03/03/2022  PCP: [facility]   Patient coming from: Home  Chief Complaint: Shortness of breath/fatigue  HPI: Kenneth Bridges is a 56 y.o. male with medical history significant for morbid obesity, OSA noncompliant with CPAP, type 2 diabetes, and hypertension who presented to the ED with complaints of shortness of breath and fatigue that has been ongoing for approximately the last 3 weeks.  He has had worsening shortness of breath particularly with exertion and denies any chest pain or significant peripheral edema.  He continues to have some lightheadedness and states that earlier today while brushing his teeth he got very exhausted and therefore presented to the ED.  He drinks a sixpack of beer only on the weekends and denies any tobacco use.  He is currently unemployed.  He denies any recent illness, cough, fevers, or chills.   ED Course: Vital signs with tachyarrhythmia noted and glucose 388.  Chest x-ray with left-sided atelectasis/effusion.  Troponin 11.  Patient given bolus of IV Cardizem without improvement and therefore started on Cardizem drip.  Cardiology consultation obtained with recommendations to start metoprolol twice daily as well as Eliquis and check TSH and 2D echocardiogram.  Review of Systems: Reviewed as noted above, otherwise negative.  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)   Sleep apnea    Wears glasses     Past Surgical History:  Procedure Laterality Date   ANKLE FRACTURE SURGERY Left 1988   COLONOSCOPY N/A 05/19/2016   Procedure: COLONOSCOPY;  Surgeon: Corbin Ade, MD;  Location: [facility];  Service: Endoscopy;  Laterality: N/A;  9:30 am   HERNIA REPAIR Left    inguinal   KNEE ARTHROSCOPY WITH MEDIAL MENISECTOMY Right 08/28/2019    Procedure: KNEE ARTHROSCOPY WITH MEDIAL MENISCECTOMY;  Surgeon: Vickki Hearing, MD;  Location: [facility];  Service: Orthopedics;  Laterality: Right;   NASAL SEPTOPLASTY W/ TURBINOPLASTY  02/13/2016   NASAL SEPTOPLASTY WITH TURBINATE REDUCTION (Bilateral)   NASAL SEPTOPLASTY W/ TURBINOPLASTY Bilateral 02/13/2016   Procedure: NASAL SEPTOPLASTY WITH TURBINATE REDUCTION;  Surgeon: Christia Reading, MD;  [facility];  Service: ENT;  Laterality: Bilateral;   SYNDESMOSIS REPAIR Left 11/07/2020   Procedure: SYNDESMOSIS REPAIR AND MEDIAL DELTOID REPAIR LEFT ANKLE;  Surgeon: Vickki Hearing, MD;  Location: [facility];  Service: Orthopedics;  Laterality: Left;     reports that he has never smoked. He has never used smokeless tobacco. He reports current alcohol use. He reports that he does not use drugs.  No Known Allergies  Family History  Problem Relation Age of Onset   Cancer Mother    Colon cancer Neg Hx    Colon polyps Neg Hx     Prior to Admission medications   Medication Sig Start Date End Date Taking? Authorizing Provider  metFORMIN (GLUCOPHAGE) 500 MG tablet Take 500 mg by mouth 2 (two) times daily with a meal. Patient not taking: Reported on 03/03/2022    [provider]  sildenafil (VIAGRA) 100 MG tablet Take by mouth. Patient not taking: Reported on 03/03/2022 12/15/20   [provider]  telmisartan-hydrochlorothiazide (MICARDIS HCT) 80-12.5 MG tablet Take 1 tablet by mouth daily. Patient not taking: Reported on 03/03/2022    [provider]    Physical Exam: Vitals:   03/03/22 1130  03/03/22 1200 03/03/22 1222 03/03/22 1226  BP: (!) 135/115 (!) 124/49 (!) 124/109   Pulse: (!) 137 (!) 127 (!) 52   Resp: 18 19 19    Temp: 98 F (36.7 C)   98.6 F (37 C)  TempSrc: Oral   Oral  SpO2: 96% 95% 95%   Weight:  (!) 139.5 kg    Height:  5\' 10"  (1.778 m)      Constitutional: NAD, calm, comfortable, obese Vitals:   03/03/22 1130 03/03/22 1200 03/03/22 1222  03/03/22 1226  BP: (!) 135/115 (!) 124/49 (!) 124/109   Pulse: (!) 137 (!) 127 (!) 52   Resp: 18 19 19    Temp: 98 F (36.7 C)   98.6 F (37 C)  TempSrc: Oral   Oral  SpO2: 96% 95% 95%   Weight:  (!) 139.5 kg    Height:  5\' 10"  (1.778 m)     Eyes: lids and conjunctivae normal Neck: normal, supple Respiratory: clear to auscultation bilaterally. Normal respiratory effort. No accessory muscle use.  Cardiovascular: Irregular and tachycardic Abdomen: no tenderness, no distention. Bowel sounds positive.  Musculoskeletal: Scant bilateral lower extremity edema Skin: no rashes, lesions, ulcers.  Psychiatric: Flat affect  Labs on Admission: I have personally reviewed following labs and imaging studies  CBC: Recent Labs  Lab 03/03/22 0955  WBC 9.9  NEUTROABS 7.4  HGB 15.8  HCT 44.9  MCV 89.4  PLT 192   Basic Metabolic Panel: Recent Labs  Lab 03/03/22 0955  NA 135  K 4.4  CL 100  CO2 25  GLUCOSE 388*  BUN 14  CREATININE 0.90  CALCIUM 9.0  MG 1.9   GFR: Estimated Creatinine Clearance: 127.6 mL/min (by C-G formula based on SCr of 0.9 mg/dL). Liver Function Tests: No results for input(s): "AST", "ALT", "ALKPHOS", "BILITOT", "PROT", "ALBUMIN" in the last 168 hours. No results for input(s): "LIPASE", "AMYLASE" in the last 168 hours. No results for input(s): "AMMONIA" in the last 168 hours. Coagulation Profile: No results for input(s): "INR", "PROTIME" in the last 168 hours. Cardiac Enzymes: No results for input(s): "CKTOTAL", "CKMB", "CKMBINDEX", "TROPONINI" in the last 168 hours. BNP (last 3 results) No results for input(s): "PROBNP" in the last 8760 hours. HbA1C: No results for input(s): "HGBA1C" in the last 72 hours. CBG: No results for input(s): "GLUCAP" in the last 168 hours. Lipid Profile: No results for input(s): "CHOL", "HDL", "LDLCALC", "TRIG", "CHOLHDL", "LDLDIRECT" in the last 72 hours. Thyroid Function Tests: No results for input(s): "TSH", "T4TOTAL",  "FREET4", "T3FREE", "THYROIDAB" in the last 72 hours. Anemia Panel: No results for input(s): "VITAMINB12", "FOLATE", "FERRITIN", "TIBC", "IRON", "RETICCTPCT" in the last 72 hours. Urine analysis:    Component Value Date/Time   COLORURINE YELLOW 05/22/2016 1001   APPEARANCEUR CLEAR 05/22/2016 1001   LABSPEC 1.010 05/22/2016 1001   PHURINE 7.0 05/22/2016 1001   GLUCOSEU NEGATIVE 05/22/2016 1001   HGBUR NEGATIVE 05/22/2016 1001   BILIRUBINUR NEGATIVE 05/22/2016 1001   KETONESUR 40 (A) 05/22/2016 1001   PROTEINUR NEGATIVE 05/22/2016 1001   NITRITE NEGATIVE 05/22/2016 1001   LEUKOCYTESUR NEGATIVE 05/22/2016 1001    Radiological Exams on Admission: DG Chest Port 1 View  Result Date: 03/03/2022 CLINICAL DATA:  56 year old male with history of fatigue and shortness of breath for the past 2 weeks. EXAM: PORTABLE CHEST 1 VIEW COMPARISON:  Chest x-ray 11/19/2013. FINDINGS: Ill-defined opacity in the base of the left hemithorax obscuring the left hemidiaphragm in the left costophrenic sulcus. Diffuse interstitial prominence and peribronchial cuffing  bilaterally. No pneumothorax. No evidence of pulmonary edema. Heart size is borderline enlarged, accentuated by portable AP technique. Prominent soft tissue in the paratracheal regions bilaterally. IMPRESSION: 1. Ill-defined opacity at the left base which may reflect atelectasis and/or consolidation with superimposed small left pleural effusion. 2. Diffuse peribronchial cuffing, concerning for an acute bronchitis. 3. Prominence of paratracheal soft tissues bilaterally concerning for potential lymphadenopathy. Follow-up contrast enhanced chest CT is suggested in the near future to better evaluate these findings. Electronically Signed   By: Vinnie Langton M.D.   On: 03/03/2022 10:13    EKG: Independently reviewed. 167 bpm, Afib.  Assessment/Plan Principal Problem:   Atrial fibrillation with RVR (HCC) Active Problems:   Obstructive sleep apnea    Diabetes (HCC)   Essential hypertension   Obesity, Class III, BMI 40-49.9 (morbid obesity) ([place])    New onset PAF with RVR -Appreciate cardiology recommendations with initiation of Lopressor 50 mg twice daily -TTE -TSH -Continue Cardizem drip for rate control -Initiate Eliquis for anticoagulation  OSA -Noncompliant with home CPAP -Lifestyle changes for weight loss as noted below  Hypertension-elevated -Continue home BP meds -Started on metoprolol 50 twice daily -Cardizem drip as above  Type 2 diabetes with hyperglycemia -Continue SSI while inpatient and hold metformin -Carb modified diet -Check A1c  Morbid obesity -BMI 44.13 -Lifestyle changes outpatient   DVT prophylaxis: Eliquis Code Status: Full Family Communication: Wife at bedside Disposition Plan: Admit for new onset atrial fibrillation with RVR Consults called: Cardiology Admission status: Inpatient, stepdown  Severity of Illness: The appropriate patient status for this patient is INPATIENT. Inpatient status is judged to be reasonable and necessary in order to provide the required intensity of service to ensure the patient's safety. The patient's presenting symptoms, physical exam findings, and initial radiographic and laboratory data in the context of their chronic comorbidities is felt to place them at high risk for further clinical deterioration. Furthermore, it is not anticipated that the patient will be medically stable for discharge from the hospital within 2 midnights of admission.   * I certify that at the point of admission it is my clinical judgment that the patient will require inpatient hospital care spanning beyond 2 midnights from the point of admission due to high intensity of service, high risk for further deterioration and high frequency of surveillance required.*   Abriel Hattery D Jordie Schreur DO [facility]  If 7PM-7AM, please contact night-coverage [email or web address]  03/03/2022, 12:27 PM

## 2022-03-04 ENCOUNTER — Inpatient Hospital Stay (HOSPITAL_COMMUNITY): Payer: No Typology Code available for payment source

## 2022-03-04 ENCOUNTER — Telehealth (HOSPITAL_COMMUNITY): Payer: Self-pay | Admitting: Pharmacy Technician

## 2022-03-04 ENCOUNTER — Other Ambulatory Visit (HOSPITAL_COMMUNITY): Payer: Self-pay

## 2022-03-04 DIAGNOSIS — G4733 Obstructive sleep apnea (adult) (pediatric): Secondary | ICD-10-CM | POA: Diagnosis not present

## 2022-03-04 DIAGNOSIS — I4891 Unspecified atrial fibrillation: Secondary | ICD-10-CM

## 2022-03-04 DIAGNOSIS — I10 Essential (primary) hypertension: Secondary | ICD-10-CM | POA: Diagnosis not present

## 2022-03-04 LAB — ECHOCARDIOGRAM COMPLETE
AR max vel: 3.07 cm2
AV Area VTI: 3.25 cm2
AV Area mean vel: 3.37 cm2
AV Mean grad: 2.3 mmHg
AV Peak grad: 4.5 mmHg
Ao pk vel: 1.06 m/s
Area-P 1/2: 4.15 cm2
Height: 70 in
MV VTI: 2.96 cm2
S' Lateral: 4.15 cm
Weight: 4846.4 oz

## 2022-03-04 LAB — BASIC METABOLIC PANEL
Anion gap: 7 (ref 5–15)
BUN: 13 mg/dL (ref 6–20)
CO2: 27 mmol/L (ref 22–32)
Calcium: 8.8 mg/dL — ABNORMAL LOW (ref 8.9–10.3)
Chloride: 99 mmol/L (ref 98–111)
Creatinine, Ser: 0.87 mg/dL (ref 0.61–1.24)
GFR, Estimated: >60 mL/min (ref >60)
Glucose, Bld: 293 mg/dL — ABNORMAL HIGH (ref 70–99)
Potassium: 4.5 mmol/L (ref 3.5–5.1)
Sodium: 133 mmol/L — ABNORMAL LOW (ref 135–145)

## 2022-03-04 LAB — CBC
HCT: 42.5 % (ref 39.0–52.0)
Hemoglobin: 14.7 g/dL (ref 13.0–17.0)
MCH: 31.2 pg (ref 26.0–34.0)
MCHC: 34.6 g/dL (ref 30.0–36.0)
MCV: 90.2 fL (ref 80.0–100.0)
Platelets: 188 10*3/uL (ref 150–400)
RBC: 4.71 MIL/uL (ref 4.22–5.81)
RDW: 12.5 % (ref 11.5–15.5)
WBC: 9.4 10*3/uL (ref 4.0–10.5)
nRBC: 0 % (ref 0.0–0.2)

## 2022-03-04 LAB — TSH: TSH: 1.213 u[IU]/mL (ref 0.350–4.500)

## 2022-03-04 LAB — GLUCOSE, CAPILLARY
Glucose-Capillary: 411 mg/dL — ABNORMAL HIGH (ref 70–99)
Glucose-Capillary: 323 mg/dL — ABNORMAL HIGH (ref 70–99)
Glucose-Capillary: 361 mg/dL — ABNORMAL HIGH (ref 70–99)
Glucose-Capillary: 293 mg/dL — ABNORMAL HIGH (ref 70–99)

## 2022-03-04 LAB — MAGNESIUM: Magnesium: 1.9 mg/dL (ref 1.7–2.4)

## 2022-03-04 MED ORDER — INSULIN STARTER KIT- SYRINGES (ENGLISH)
1.0000 | Freq: Once | Status: AC
Start: 2022-03-04 — End: 2022-03-04
  Administered 2022-03-04: 1
  Filled 2022-03-04: qty 1

## 2022-03-04 MED ORDER — FUROSEMIDE 10 MG/ML IJ SOLN
40.0000 mg | Freq: Once | INTRAMUSCULAR | Status: AC
  Administered 2022-03-04: 40 mg via INTRAVENOUS
  Filled 2022-03-04: qty 4

## 2022-03-04 MED ORDER — INSULIN GLARGINE-YFGN 100 UNIT/ML ~~LOC~~ SOLN
10.0000 [IU] | Freq: Every day | SUBCUTANEOUS | Status: DC
  Administered 2022-03-04 – 2022-03-05 (×2): 10 [IU] via SUBCUTANEOUS
  Filled 2022-03-04 (×3): qty 0.1

## 2022-03-04 MED ORDER — INSULIN ASPART 100 UNIT/ML IJ SOLN
4.0000 [IU] | Freq: Three times a day (TID) | INTRAMUSCULAR | Status: DC
  Administered 2022-03-04 – 2022-03-05 (×3): 4 [IU] via SUBCUTANEOUS

## 2022-03-04 MED ORDER — PERFLUTREN LIPID MICROSPHERE
1.0000 mL | INTRAVENOUS | Status: AC | PRN
  Administered 2022-03-04: 6 mL via INTRAVENOUS

## 2022-03-04 MED ORDER — METOPROLOL TARTRATE 50 MG PO TABS
50.0000 mg | ORAL_TABLET | Freq: Three times a day (TID) | ORAL | Status: DC
  Administered 2022-03-04 – 2022-03-05 (×4): 50 mg via ORAL
  Filled 2022-03-04 (×4): qty 1

## 2022-03-04 NOTE — Progress Notes (Addendum)
PROGRESS NOTE    Kenneth Bridges  IBB:[id number] DOB: Dec 25, 1965 DOA: 03/03/2022 PCP: [facility]   Brief Narrative:    Kenneth Bridges is a 56 y.o. male with medical history significant for morbid obesity, OSA noncompliant with CPAP, type 2 diabetes, and hypertension who presented to the ED with complaints of shortness of breath and fatigue that has been ongoing for approximately the last 3 weeks.  Patient has been admitted with new diagnosis of atrial fibrillation with RVR and remains on Cardizem drip.  Cardiology following with plans for TEE/DCCV in a.m.  Assessment & Plan:   Principal Problem:   Atrial fibrillation with RVR (HCC) Active Problems:   Obstructive sleep apnea   Diabetes (HCC)   Essential hypertension   Obesity, Class III, BMI 40-49.9 (morbid obesity) (HCC)  Assessment and Plan:   New onset PAF with RVR -Appreciate cardiology recommendations with increase of Lopressor to 50 mg 3 times daily to help wean off Cardizem drip. -Plan for TEE/DCCV in a.m. -TSH within normal limits -Continue Cardizem drip for rate control -Initiate Eliquis for anticoagulation   OSA -Noncompliant with home CPAP -Lifestyle changes for weight loss as noted below   Hypertension-elevated -Continue home BP meds -Started on metoprolol 50 twice daily, now 3 times daily -Cardizem drip as above   Type 2 diabetes with hyperglycemia -Continue SSI while inpatient and hold metformin -Carb modified diet -A1c 11% -Appreciate diabetes coordinator recommendations with addition of Semglee and NovoLog 4 units 3 times daily today   Morbid obesity -BMI 44.13 -Lifestyle changes outpatient    DVT prophylaxis: Eliquis Code Status: Full Family Communication: Discussed with wife at bedside 7/26 Disposition Plan:  Status is: Inpatient Remains inpatient appropriate because: IV medications.   Consultants:  Cardiology  Procedures:  None  Antimicrobials:   None   Subjective: Patient seen and evaluated today with ongoing elevated heart rates overnight with some shortness of breath.  Denies chest pain.  Objective: Vitals:   03/04/22 0400 03/04/22 0500 03/04/22 0700 03/04/22 0800  BP: 103/73 (!) 113/91 (!) 128/92   Pulse: 98 67 (!) 39 (!) 102  Resp: (!) _0 Temp: (!) 97.4 F (36.3 C)   98.7 F (37.1 C)  TempSrc: Axillary     SpO2: 98% 97% 99% 96%  Weight:  (!) 137.4 kg    Height:        Intake/Output Summary (Last 24 hours) at 03/04/2022 0928 Last data filed at 03/04/2022 0500 Gross per 24 hour  Intake 1564.73 ml  Output --  Net 1564.73 ml   Filed Weights   03/03/22 0904 03/03/22 1200 03/04/22 0500  Weight: 134.3 kg (!) 139.5 kg (!) 137.4 kg    Examination:  General exam: Appears calm and comfortable, obese Respiratory system: Clear to auscultation. Respiratory effort normal. Cardiovascular system: S1 & S2 heard, irregular and tachycardic Gastrointestinal system: Abdomen is soft Central nervous system: Alert and awake Extremities: No edema Skin: No significant lesions noted Psychiatry: Flat affect.    Data Reviewed: I have personally reviewed following labs and imaging studies  CBC: Recent Labs  Lab 03/03/22 0955 03/04/22 0418  WBC 9.9 9.4  NEUTROABS 7.4  --   HGB 15.8 14.7  HCT 44.9 42.5  MCV 89.4 90.2  PLT 192 450   Basic Metabolic Panel: Recent Labs  Lab 03/03/22 0955 03/04/22 0418  NA 135 133*  K 4.4 4.5  CL 100 99  CO2 25 27  GLUCOSE 388* 293*  BUN 14  13  CREATININE 0.90 0.87  CALCIUM 9.0 8.8*  MG 1.9 1.9   GFR: Estimated Creatinine Clearance: 130.9 mL/min (by C-G formula based on SCr of 0.87 mg/dL). Liver Function Tests: No results for input(s): "AST", "ALT", "ALKPHOS", "BILITOT", "PROT", "ALBUMIN" in the last 168 hours. No results for input(s): "LIPASE", "AMYLASE" in the last 168 hours. No results for input(s): "AMMONIA" in the last 168 hours. Coagulation Profile: No results  for input(s): "INR", "PROTIME" in the last 168 hours. Cardiac Enzymes: No results for input(s): "CKTOTAL", "CKMB", "CKMBINDEX", "TROPONINI" in the last 168 hours. BNP (last 3 results) No results for input(s): "PROBNP" in the last 8760 hours. HbA1C: Recent Labs    03/03/22 1241  HGBA1C 11.0*   CBG: Recent Labs  Lab 03/03/22 1555 03/03/22 2107 03/04/22 0805  GLUCAP 398* 331* 411*   Lipid Profile: No results for input(s): "CHOL", "HDL", "LDLCALC", "TRIG", "CHOLHDL", "LDLDIRECT" in the last 72 hours. Thyroid Function Tests: Recent Labs    03/04/22 0418  TSH 1.213   Anemia Panel: No results for input(s): "VITAMINB12", "FOLATE", "FERRITIN", "TIBC", "IRON", "RETICCTPCT" in the last 72 hours. Sepsis Labs: No results for input(s): "PROCALCITON", "LATICACIDVEN" in the last 168 hours.  Recent Results (from the past 240 hour(s))  MRSA Next Gen by PCR, Nasal     Status: None   Collection Time: 03/03/22  3:06 PM   Specimen: Nasal Mucosa; Nasal Swab  Result Value Ref Range Status   MRSA by PCR Next Gen NOT DETECTED NOT DETECTED Final    Comment: (NOTE) The GeneXpert MRSA Assay (FDA approved for NASAL specimens only), is one component of a comprehensive MRSA colonization surveillance program. It is not intended to diagnose MRSA infection nor to guide or monitor treatment for MRSA infections. Test performance is not FDA approved in patients less than 8 years old. Performed at [facility], [address], [place], [place] [address]          Radiology Studies: [facility] Chest [place] 1 View  Result Date: 03/03/2022 CLINICAL DATA:  56 year old male with history of fatigue and shortness of breath for the past 2 weeks. EXAM: PORTABLE CHEST 1 VIEW COMPARISON:  Chest x-ray 11/19/2013. FINDINGS: Ill-defined opacity in the base of the left hemithorax obscuring the left hemidiaphragm in the left costophrenic sulcus. Diffuse interstitial prominence and peribronchial cuffing bilaterally. No  pneumothorax. No evidence of pulmonary edema. Heart size is borderline enlarged, accentuated by portable AP technique. Prominent soft tissue in the paratracheal regions bilaterally. IMPRESSION: 1. Ill-defined opacity at the left base which may reflect atelectasis and/or consolidation with superimposed small left pleural effusion. 2. Diffuse peribronchial cuffing, concerning for an acute bronchitis. 3. Prominence of paratracheal soft tissues bilaterally concerning for potential lymphadenopathy. Follow-up contrast enhanced chest CT is suggested in the near future to better evaluate these findings. Electronically Signed   By: Vinnie Langton M.D.   On: 03/03/2022 10:13        Scheduled Meds:  apixaban  5 mg Oral BID   Chlorhexidine Gluconate Cloth  6 each Topical Daily   irbesartan  300 mg Oral Daily   And   hydrochlorothiazide  12.5 mg Oral Daily   insulin aspart  0-20 Units Subcutaneous TID WC   insulin aspart  0-5 Units Subcutaneous QHS   insulin aspart  4 Units Subcutaneous TID WC   insulin glargine-yfgn  10 Units Subcutaneous Daily   insulin starter kit- syringes  1 kit Other Once   metoprolol tartrate  50 mg Oral TID  Continuous Infusions:  diltiazem (CARDIZEM) infusion 15 mg/hr (03/04/22 0612)     LOS: 1 day    Time spent: 35 minutes    Cyndel Griffey D Manuella Ghazi, DO [facility]  If 7PM-7AM, please contact night-coverage [email or web address] 03/04/2022, 9:28 AM

## 2022-03-04 NOTE — Progress Notes (Signed)
*  PRELIMINARY RESULTS* Echocardiogram 2D Echocardiogram has been performed.  Kenneth Bridges 03/04/2022, 9:48 AM

## 2022-03-04 NOTE — Telephone Encounter (Signed)
Pharmacy Patient Advocate Encounter  Insurance verification completed.    The patient is insured through the [place]   The patient is currently admitted and ran test claims for the following: Semglee, Basaglar, Lantus, Levemir, Novolog, Humalog, Apidra.  Copays and coinsurance results were relayed to Inpatient clinical team.

## 2022-03-04 NOTE — Inpatient Diabetes Management (Addendum)
Inpatient Diabetes Program Recommendations  AACE/ADA: New Consensus Statement on Inpatient Glycemic Control   Target Ranges:  Prepandial:   less than 140 mg/dL      Peak postprandial:   less than 180 mg/dL (1-2 hours)      Critically ill patients:  140 - 180 mg/dL    Latest Reference Range & Units 03/04/22 08:05  Glucose-Capillary 70 - 99 mg/dL 411 (H)    Latest Reference Range & Units 03/04/22 04:18  Glucose 70 - 99 mg/dL 293 (H)    Latest Reference Range & Units 03/03/22 15:55 03/03/22 21:07  Glucose-Capillary 70 - 99 mg/dL 398 (H) 331 (H)    Latest Reference Range & Units 11/05/20 15:49 03/03/22 12:41  Hemoglobin A1C 4.8 - 5.6 % 10.0 (H) 11.0 (H)    Review of Glycemic Control  Diabetes history: DM2 Outpatient Diabetes medications: Metformin 500 mg BID Current orders for Inpatient glycemic control: Novolog 0-20 units TID with meals, Novolog 0-5 units QHS   Inpatient Diabetes Program Recommendations:    Insulin: Please consider ordering Semglee 10 units daily and Novolog 4 units TID with meals for meal coverage.  HbgA1C: A1C 11% on 03/03/22 indicating an average glucose of 269 mg/dl over the past 2-3 months.  Outpatient: If patient is discharged new to insulin, he would prefer vial/syringe. Will need Rx for glucose monitoring kit (#[id number]). Patient's insurance covers Lantus (#[id number]) and Humalog (#[id number]) insulin; would also need insulin syringes (#[id number]).  Addendum 03/04/22'@10' :50-Spoke with patient over the phone to update on results of A1C. Informed patient that current A1C 11% indicating an average glucose of 269 mg/dl. Informed patient that he may likely be discharged on insulin and if so that insurance covers Lantus and Humalog insulin. Discussed Lantus and Humalog insulin and how each one works. Patient reports that nursing staff has been working with him on insulin but he has no yet given himself an insulin injection. Asked patient to start giving all insulin injections  while inpatient so he will be comfortable with self administration prior to discharge. Reviewed insulin injection sites and importance of site rotation. Asked patient to start checking glucose as directed and to be [facility] and get [facility]. Asked patient to take all DM meds as prescribed at discharge and be sure to take glucometer to follow up appointments. Patient verbalized understanding of information and he has no questions at this time. Ordered insulin starter kit (vial/syringe) and patient education by RNs to work with patient on insulin administration.  Thanks, Barnie Alderman, RN, MSN, [employer] Diabetes Coordinator Inpatient Diabetes Program [phone] (Team Pager from 8am to [place])

## 2022-03-04 NOTE — Discharge Instructions (Signed)

## 2022-03-04 NOTE — Progress Notes (Addendum)
Progress Note  Patient Name: Kenneth Bridges Date of Encounter: 03/04/2022  [facility] Cardiologist: None   Subjective   Remains in A-fib, rates 70s to 90s overnight, up to 130s this morning.  Denies any chest pain.  Reports shortness of breath.  Inpatient Medications    Scheduled Meds:  apixaban  5 mg Oral BID   Chlorhexidine Gluconate Cloth  6 each Topical Daily   irbesartan  300 mg Oral Daily   And   hydrochlorothiazide  12.5 mg Oral Daily   insulin aspart  0-20 Units Subcutaneous TID WC   insulin aspart  0-5 Units Subcutaneous QHS   insulin starter kit- syringes  1 kit Other Once   metoprolol tartrate  50 mg Oral BID   Continuous Infusions:  diltiazem (CARDIZEM) infusion 15 mg/hr (03/04/22 0612)   PRN Meds: acetaminophen **OR** acetaminophen, ondansetron **OR** ondansetron (ZOFRAN) IV, mouth rinse   Vital Signs    Vitals:   03/03/22 2254 03/04/22 0200 03/04/22 0400 03/04/22 0500  BP: 106/64 109/76 103/73 (!) 113/91  Pulse: 97 68 98 67  Resp: 12 13 (!) 21 16  Temp:   (!) 97.4 F (36.3 C)   TempSrc:   Axillary   SpO2: 100% 98% 98% 97%  Weight:    (!) 137.4 kg  Height:        Intake/Output Summary (Last 24 hours) at 03/04/2022 0825 Last data filed at 03/04/2022 0500 Gross per 24 hour  Intake 1564.73 ml  Output --  Net 1564.73 ml      03/04/2022    5:00 AM 03/03/2022   12:00 PM 03/03/2022    9:04 AM  Last 3 Weights  Weight (lbs) 302 lb 14.4 oz 307 lb 8.7 oz 296 lb  Weight (kg) 137.395 kg 139.5 kg 134.265 kg      Telemetry    Afib 70-90s overnight with pauses up to 2 seconds.  Currently in Afib with rates 110-130s. - Personally Reviewed  ECG    No new ECG - Personally Reviewed  Physical Exam   GEN: No acute distress.   Neck: Difficult to assess JVD given habitus Cardiac: irregular, tachycardic,  no murmurs, rubs, or gallops.  Respiratory: Diminished breath sounds GI: Soft, nontender, non-distended  MS: trace edema Neuro:  Nonfocal   Psych: Normal affect   Labs    High Sensitivity Troponin:   Recent Labs  Lab 03/03/22 0955 03/03/22 1129  TROPONINIHS 11 11     Chemistry Recent Labs  Lab 03/03/22 0955 03/04/22 0418  NA 135 133*  K 4.4 4.5  CL 100 99  CO2 25 27  GLUCOSE 388* 293*  BUN 14 13  CREATININE 0.90 0.87  CALCIUM 9.0 8.8*  MG 1.9 1.9  GFRNONAA >60 >60  ANIONGAP 10 7    Lipids No results for input(s): "CHOL", "TRIG", "HDL", "LABVLDL", "LDLCALC", "CHOLHDL" in the last 168 hours.  Hematology Recent Labs  Lab 03/03/22 0955 03/04/22 0418  WBC 9.9 9.4  RBC 5.02 4.71  HGB 15.8 14.7  HCT 44.9 42.5  MCV 89.4 90.2  MCH 31.5 31.2  MCHC 35.2 34.6  RDW 12.5 12.5  PLT 192 188   Thyroid  Recent Labs  Lab 03/04/22 0418  TSH 1.213    BNPNo results for input(s): "BNP", "PROBNP" in the last 168 hours.  DDimer No results for input(s): "DDIMER" in the last 168 hours.   Radiology    DG Chest Port 1 View  Result Date: 03/03/2022 CLINICAL DATA:  56 year old male  with history of fatigue and shortness of breath for the past 2 weeks. EXAM: PORTABLE CHEST 1 VIEW COMPARISON:  Chest x-ray 11/19/2013. FINDINGS: Ill-defined opacity in the base of the left hemithorax obscuring the left hemidiaphragm in the left costophrenic sulcus. Diffuse interstitial prominence and peribronchial cuffing bilaterally. No pneumothorax. No evidence of pulmonary edema. Heart size is borderline enlarged, accentuated by portable AP technique. Prominent soft tissue in the paratracheal regions bilaterally. IMPRESSION: 1. Ill-defined opacity at the left base which may reflect atelectasis and/or consolidation with superimposed small left pleural effusion. 2. Diffuse peribronchial cuffing, concerning for an acute bronchitis. 3. Prominence of paratracheal soft tissues bilaterally concerning for potential lymphadenopathy. Follow-up contrast enhanced chest CT is suggested in the near future to better evaluate these findings. Electronically  Signed   By: Vinnie Langton M.D.   On: 03/03/2022 10:13    Cardiac Studies     Patient Profile     56 y.o. male with morbid obesity, OSA noncompliant with CPAP, type 2 diabetes, and hypertension presents with new A-fib  Assessment & Plan    Atrial fibrillation with RVR: new diagnosis. CHA2DS2-VASc score 2 (hypertension, diabetes) -Continue Eliquis 5 mg twice daily -Currently on Cardizem drip 15 mg/h, in addition to metoprolol 50 mg twice daily.  Will increase metoprolol to 50 mg TID and try to titrate off gtt -Echocardiogram reviewed, EF appears low normal -Given rates remain elevated, would favor trying to restore sinus rhythm.  Plan for TEE/DCCV tomorrow.   -After careful review of history and examination, the risks and benefits of transesophageal echocardiogram have been explained including risks of esophageal damage, perforation (1:10,000 risk), bleeding, pharyngeal hematoma as well as other potential complications associated with conscious sedation including aspiration, arrhythmia, respiratory failure and death. Alternatives to treatment were discussed, questions were answered. Patient is willing to proceed.   Acute diastolic heart failure: Appears mildly hypervolemic on exam.  Will give dose of IV Lasix 40 mg today  OSA: Recommend compliance with CPAP.  Suspect untreated OSA is cause of his Afib  Hypertension: Continue metoprolol   Morbid obesity: Body mass index is 43.46 kg/m.   For questions or updates, please contact [place] Please consult [email or web address] for contact info under        Signed, Donato Heinz, MD  03/04/2022, 8:25 AM

## 2022-03-04 NOTE — Plan of Care (Signed)

## 2022-03-04 NOTE — Progress Notes (Signed)
  Transition of Care [facility]) Screening Note   Patient Details  Name: Kenneth Bridges Date of Birth: 06/11/1966   Transition of Care [facility]) CM/SW Contact:    Villa Herb, LCSWA Phone Number: 03/04/2022, 11:02 AM  CSW updated [facility] of [facility] admission. [facility] notification is [phone].   Transition of Care Department [facility]) has reviewed patient and no TOC needs have been identified at this time. We will continue to monitor patient advancement through interdisciplinary progression rounds. If new patient transition needs arise, please place a TOC consult.

## 2022-03-05 ENCOUNTER — Encounter (HOSPITAL_COMMUNITY)
Admission: EM | Disposition: A | Discharge status: Home / Self Care | Payer: Self-pay | Source: Home / Self Care | Attending: Internal Medicine

## 2022-03-05 ENCOUNTER — Other Ambulatory Visit (HOSPITAL_COMMUNITY): Payer: Self-pay | Admitting: *Deleted

## 2022-03-05 ENCOUNTER — Inpatient Hospital Stay (HOSPITAL_COMMUNITY): Payer: No Typology Code available for payment source

## 2022-03-05 ENCOUNTER — Encounter (HOSPITAL_COMMUNITY): Payer: Self-pay | Admitting: Internal Medicine

## 2022-03-05 DIAGNOSIS — I4891 Unspecified atrial fibrillation: Secondary | ICD-10-CM

## 2022-03-05 DIAGNOSIS — I080 Rheumatic disorders of both mitral and aortic valves: Secondary | ICD-10-CM

## 2022-03-05 DIAGNOSIS — Z794 Long term (current) use of insulin: Secondary | ICD-10-CM

## 2022-03-05 DIAGNOSIS — I5031 Acute diastolic (congestive) heart failure: Secondary | ICD-10-CM

## 2022-03-05 DIAGNOSIS — I10 Essential (primary) hypertension: Secondary | ICD-10-CM

## 2022-03-05 DIAGNOSIS — E1165 Type 2 diabetes mellitus with hyperglycemia: Secondary | ICD-10-CM

## 2022-03-05 DIAGNOSIS — Z7984 Long term (current) use of oral hypoglycemic drugs: Secondary | ICD-10-CM

## 2022-03-05 DIAGNOSIS — I340 Nonrheumatic mitral (valve) insufficiency: Secondary | ICD-10-CM

## 2022-03-05 HISTORY — PX: TEE WITHOUT CARDIOVERSION: SHX5443

## 2022-03-05 HISTORY — PX: CARDIOVERSION: SHX1299

## 2022-03-05 LAB — GLUCOSE, CAPILLARY
Glucose-Capillary: 277 mg/dL — ABNORMAL HIGH (ref 70–99)
Glucose-Capillary: 285 mg/dL — ABNORMAL HIGH (ref 70–99)

## 2022-03-05 LAB — BASIC METABOLIC PANEL
Anion gap: 6 (ref 5–15)
BUN: 17 mg/dL (ref 6–20)
CO2: 29 mmol/L (ref 22–32)
Calcium: 8.8 mg/dL — ABNORMAL LOW (ref 8.9–10.3)
Chloride: 100 mmol/L (ref 98–111)
Creatinine, Ser: 0.98 mg/dL (ref 0.61–1.24)
GFR, Estimated: >60 mL/min (ref >60)
Glucose, Bld: 248 mg/dL — ABNORMAL HIGH (ref 70–99)
Potassium: 4 mmol/L (ref 3.5–5.1)
Sodium: 135 mmol/L (ref 135–145)

## 2022-03-05 LAB — MAGNESIUM: Magnesium: 2.1 mg/dL (ref 1.7–2.4)

## 2022-03-05 SURGERY — CARDIOVERSION
Anesthesia: General

## 2022-03-05 MED ORDER — INSULIN SYRINGE 31G X 5/16" 0.5 ML MISC
3 refills | Status: AC
Start: 2022-03-05 — End: ?

## 2022-03-05 MED ORDER — APIXABAN 5 MG PO TABS: 5.0000 mg | ORAL_TABLET | Freq: Two times a day (BID) | ORAL | 0 refills | Status: DC

## 2022-03-05 MED ORDER — LACTATED RINGERS IV SOLN: INTRAVENOUS | Status: DC

## 2022-03-05 MED ORDER — FUROSEMIDE 40 MG PO TABS
40.0000 mg | ORAL_TABLET | Freq: Every day | ORAL | Status: DC
  Administered 2022-03-05: 40 mg via ORAL
  Filled 2022-03-05: qty 1

## 2022-03-05 MED ORDER — PROPOFOL 10 MG/ML IV BOLUS
INTRAVENOUS | Status: AC
  Filled 2022-03-05: qty 20

## 2022-03-05 MED ORDER — INSULIN GLARGINE 100 UNIT/ML ~~LOC~~ SOLN: 10.0000 [IU] | Freq: Every day | SUBCUTANEOUS | 3 refills | Status: DC

## 2022-03-05 MED ORDER — FUROSEMIDE 40 MG PO TABS: 40.0000 mg | ORAL_TABLET | Freq: Every day | ORAL | 1 refills | Status: DC

## 2022-03-05 MED ORDER — BLOOD GLUCOSE MONITOR KIT
PACK | 0 refills | Status: AC
Start: 2022-03-05 — End: ?

## 2022-03-05 MED ORDER — SPIRONOLACTONE 12.5 MG HALF TABLET
12.5000 mg | ORAL_TABLET | Freq: Every day | ORAL | Status: DC
  Administered 2022-03-05: 12.5 mg via ORAL
  Filled 2022-03-05: qty 1

## 2022-03-05 MED ORDER — ORAL CARE MOUTH RINSE: 15.0000 mL | Freq: Once | OROMUCOSAL | Status: DC

## 2022-03-05 MED ORDER — SPIRONOLACTONE 25 MG PO TABS: 12.5000 mg | ORAL_TABLET | Freq: Every day | ORAL | 0 refills | Status: DC

## 2022-03-05 MED ORDER — CHLORHEXIDINE GLUCONATE 0.12 % MT SOLN: 15.0000 mL | Freq: Once | OROMUCOSAL | Status: DC

## 2022-03-05 MED ORDER — HUMALOG 100 UNIT/ML ~~LOC~~ SOCT: 4.0000 [IU] | Freq: Three times a day (TID) | SUBCUTANEOUS | 11 refills | Status: AC

## 2022-03-05 MED ORDER — PROPOFOL 10 MG/ML IV BOLUS
INTRAVENOUS | Status: DC | PRN
  Administered 2022-03-05 (×2): 50 mg via INTRAVENOUS
  Administered 2022-03-05: 100 mg via INTRAVENOUS
  Administered 2022-03-05: 50 mg via INTRAVENOUS
  Administered 2022-03-05: 20 mg via INTRAVENOUS

## 2022-03-05 MED ORDER — METOPROLOL TARTRATE 50 MG PO TABS: 50.0000 mg | ORAL_TABLET | Freq: Three times a day (TID) | ORAL | 0 refills | Status: DC

## 2022-03-05 MED ORDER — LACTATED RINGERS IV SOLN: INTRAVENOUS | Status: DC | PRN

## 2022-03-05 NOTE — Discharge Summary (Signed)
Physician Discharge Summary  [name]:[id number] DOB: [birth year] DOA: 03/03/2022  PCP: [facility]  Admit date: 03/03/2022  Discharge date: 03/05/2022  Admitted From:Home  Disposition:  Home  Recommendations for Outpatient Follow-up:  Follow up with PCP in 1-2 weeks Follow-up with cardiology as scheduled on 8/15 Continue Eliquis for anticoagulation after TEE/DCCV on 7/28 Continue metoprolol, Lasix, and Aldactone as prescribed Continue insulin as prescribed below with glucometer and syringes and vial Continue home metformin as prior  Home Health: None  Equipment/Devices: Continue use of home CPAP  Discharge Condition:Stable  CODE STATUS: Full  Diet recommendation: Heart Healthy/carb modified  Brief/Interim Summary: Kenneth Bridges is a 56 y.o. male with medical history significant for morbid obesity, OSA noncompliant with CPAP, type 2 diabetes, and hypertension who presented to the ED with complaints of shortness of breath and fatigue that has been ongoing for approximately the last 3 weeks.  Patient has been admitted with new diagnosis of atrial fibrillation with RVR and was started on Cardizem drip for a couple days.  Oral metoprolol was used in an attempt to wean him off Cardizem, but he continued to remain symptomatic and had elevated heart rates for which he underwent TEE/DCCV on 7/28 with return to sinus rhythm.  He has tolerated this procedure well and will now be discharged on Eliquis for anticoagulation as well as metoprolol.  He will also remain on spironolactone and Lasix as prescribed by cardiology.  His hemoglobin A1c was noted to be 11% and therefore he will require more aggressive management of his diabetes with home insulin as prescribed above.  No other acute events or concerns noted throughout the course of this admission.  Discharge Diagnoses:  Principal Problem:   Atrial fibrillation with RVR ([place]) Active Problems:   Obstructive  sleep apnea   Diabetes (H[facility])   Essential hypertension   Obesity, Class III, BMI 40-49.9 (morbid obesity) ([place])  Principal discharge diagnosis: New onset atrial fibrillation with RVR status post TEE/DCCV.  Acute diastolic CHF exacerbation.  Type 2 diabetes-uncontrolled with hyperglycemia.  Discharge Instructions  Discharge Instructions     Diet - low sodium heart healthy   Complete by: As directed    Increase activity slowly   Complete by: As directed       Allergies as of 03/05/2022   No Known Allergies      Medication List     TAKE these medications    apixaban 5 MG Tabs tablet Commonly known as: ELIQUIS Take 1 tablet (5 mg total) by mouth 2 (two) times daily.   blood glucose meter kit and supplies Kit Dispense based on patient and insurance preference. Use up to four times daily as directed.   furosemide 40 MG tablet Commonly known as: LASIX Take 1 tablet (40 mg total) by mouth daily.   HumaLOG 100 UNIT/ML cartridge Generic drug: insulin lispro Inject 0.04 mLs (4 Units total) into the skin 3 (three) times daily with meals.   insulin glargine 100 UNIT/ML injection Commonly known as: LANTUS Inject 0.1 mLs (10 Units total) into the skin daily.   INSULIN SYRINGE .5CC/31GX5/16" 31G X 5/16" 0.5 ML Misc Take as directed   metFORMIN 500 MG tablet Commonly known as: GLUCOPHAGE Take 500 mg by mouth 2 (two) times daily with a meal.   metoprolol tartrate 50 MG tablet Commonly known as: LOPRESSOR Take 1 tablet (50 mg total) by mouth 3 (three) times daily.   sildenafil 100 MG tablet Commonly known as: VIAGRA Take by mouth.  spironolactone 25 MG tablet Commonly known as: ALDACTONE Take 0.5 tablets (12.5 mg total) by mouth daily.   telmisartan-hydrochlorothiazide 80-12.5 MG tablet Commonly known as: MICARDIS HCT Take 1 tablet by mouth daily.        Follow-up Information     Erma Heritage, PA-C Follow up on 03/23/2022.   Specialties: Physician  Assistant, Cardiology Why: [facility] Follow-up on 03/23/2022 at 2:30 PM. Contact information: [place] [address] [phone]         [facility]. Schedule an appointment as soon as possible for a visit in 1 week(s).   Contact information: [place] [address] [phone]                No Known Allergies  Consultations: Cardiology   Procedures/Studies: ECHOCARDIOGRAM COMPLETE  Result Date: 03/04/2022    ECHOCARDIOGRAM REPORT   Patient Name:   Kenneth Bridges Date of Exam: 03/04/2022 Medical Rec #:  [id number]           Height:       70.0 in Accession #:    [id number]          Weight:       302.9 lb Date of Birth:  27-Mar-1966          BSA:          2.490 m Patient Age:    56 years            BP:           113/91 mmHg Patient Gender: M                   HR:           110 bpm. Exam Location:  [name] Procedure: 2D Echo, Cardiac Doppler, Color Doppler and Intracardiac            Opacification Agent Indications:    Atrial Fibrillation  History:        Patient has no prior history of Echocardiogram examinations.                 Arrythmias:Atrial Fibrillation; Risk Factors:Hypertension and                 Diabetes.  Sonographer:    Wenda Low Referring Phys: [id number] Erma Heritage  Sonographer Comments: Patient is morbidly obese. IMPRESSIONS  1. Left ventricular ejection fraction, by estimation, is 50 to 55%. The left ventricle has low normal function. The left ventricle demonstrates regional wall motion abnormalities. Apical hypokinesis. There is mild left ventricular hypertrophy. Left ventricular diastolic parameters are indeterminate.  2. Right ventricular systolic function is normal. The right ventricular size is normal.  3. Left atrial size was mildly dilated.  4. Right atrial size was mildly dilated.  5. The mitral valve is normal in structure. Trivial mitral valve regurgitation. No evidence of  mitral stenosis.  6. The aortic valve was not well visualized. Aortic valve regurgitation is not visualized. No aortic stenosis is present.  7. Aortic dilatation noted. There is dilatation of the aortic root, measuring 45 mm. There is dilatation of the ascending aorta, measuring 40 mm.  8. The inferior vena cava is dilated in size with >50% respiratory variability, suggesting right atrial pressure of 8 mmHg. FINDINGS  Left Ventricle: Left ventricular ejection fraction, by estimation, is 50 to 55%. The left ventricle has low normal function. The left ventricle demonstrates regional wall motion abnormalities. Definity contrast  agent was given IV to delineate the left ventricular endocardial borders. The left ventricular internal cavity size was normal in size. There is mild left ventricular hypertrophy. Left ventricular diastolic parameters are indeterminate. Right Ventricle: The right ventricular size is normal. No increase in right ventricular wall thickness. Right ventricular systolic function is normal. Left Atrium: Left atrial size was mildly dilated. Right Atrium: Right atrial size was mildly dilated. Pericardium: There is no evidence of pericardial effusion. Mitral Valve: The mitral valve is normal in structure. Trivial mitral valve regurgitation. No evidence of mitral valve stenosis. MV peak gradient, 7.2 mmHg. The mean mitral valve gradient is 2.5 mmHg. Tricuspid Valve: The tricuspid valve is normal in structure. Tricuspid valve regurgitation is trivial. Aortic Valve: The aortic valve was not well visualized. Aortic valve regurgitation is not visualized. No aortic stenosis is present. Aortic valve mean gradient measures 2.3 mmHg. Aortic valve peak gradient measures 4.5 mmHg. Aortic valve area, by VTI measures 3.25 cm. Pulmonic Valve: The pulmonic valve was not well visualized. Pulmonic valve regurgitation is not visualized. Aorta: Aortic dilatation noted. There is dilatation of the aortic root, measuring 45  mm. There is dilatation of the ascending aorta, measuring 40 mm. Venous: The inferior vena cava is dilated in size with greater than 50% respiratory variability, suggesting right atrial pressure of 8 mmHg. IAS/Shunts: The interatrial septum was not well visualized.  LEFT VENTRICLE PLAX 2D LVIDd:         5.50 cm   Diastology LVIDs:         4.15 cm   LV e' lateral:   9.03 cm/s LV PW:         1.10 cm   LV E/e' lateral: 13.2 LV IVS:        1.20 cm LVOT diam:     2.10 cm LV SV:         64 LV SV Index:   26 LVOT Area:     3.46 cm  RIGHT VENTRICLE RV Basal diam:  4.00 cm RV Mid diam:    3.60 cm RV S prime:     15.40 cm/s LEFT ATRIUM              Index        RIGHT ATRIUM           Index LA diam:        4.70 cm  1.89 cm/m   RA Area:     25.60 cm LA Vol (A2C):   112.0 ml 44.98 ml/m  RA Volume:   83.80 ml  33.65 ml/m LA Vol (A4C):   85.6 ml  34.38 ml/m LA Biplane Vol: 101.0 ml 40.56 ml/m  AORTIC VALVE                    PULMONIC VALVE AV Area (Vmax):    3.07 cm     PV Vmax:       0.61 m/s AV Area (Vmean):   3.37 cm     PV Peak grad:  1.5 mmHg AV Area (VTI):     3.25 cm AV Vmax:           106.00 cm/s AV Vmean:          71.567 cm/s AV VTI:            0.195 m AV Peak Grad:      4.5 mmHg AV Mean Grad:      2.3 mmHg LVOT Vmax:  94.00 cm/s LVOT Vmean:        69.650 cm/s LVOT VTI:          0.184 m LVOT/AV VTI ratio: 0.94  AORTA Ao Root diam: 4.50 cm Ao Asc diam:  4.00 cm MITRAL VALVE MV Area (PHT): 4.15 cm     SHUNTS MV Area VTI:   2.96 cm     Systemic VTI:  0.18 m MV Peak grad:  7.2 mmHg     Systemic Diam: 2.10 cm MV Mean grad:  2.5 mmHg MV Vmax:       1.34 m/s MV Vmean:      67.8 cm/s MV Decel Time: 183 msec MV E velocity: 119.50 cm/s MV A velocity: 9.68 cm/s MV E/A ratio:  12.35 Oswaldo Milian MD Electronically signed by Oswaldo Milian MD Signature Date/Time: 03/04/2022/10:19:45 AM    Final    DG Chest Port 1 View  Result Date: 03/03/2022 CLINICAL DATA:  56 year old male with history of fatigue  and shortness of breath for the past 2 weeks. EXAM: PORTABLE CHEST 1 VIEW COMPARISON:  Chest x-ray 11/19/2013. FINDINGS: Ill-defined opacity in the base of the left hemithorax obscuring the left hemidiaphragm in the left costophrenic sulcus. Diffuse interstitial prominence and peribronchial cuffing bilaterally. No pneumothorax. No evidence of pulmonary edema. Heart size is borderline enlarged, accentuated by portable AP technique. Prominent soft tissue in the paratracheal regions bilaterally. IMPRESSION: 1. Ill-defined opacity at the left base which may reflect atelectasis and/or consolidation with superimposed small left pleural effusion. 2. Diffuse peribronchial cuffing, concerning for an acute bronchitis. 3. Prominence of paratracheal soft tissues bilaterally concerning for potential lymphadenopathy. Follow-up contrast enhanced chest CT is suggested in the near future to better evaluate these findings. Electronically Signed   By: Vinnie Langton M.D.   On: 03/03/2022 10:13     Discharge Exam: Vitals:   03/05/22 1015 03/05/22 1025  BP: 93/78 100/72  Pulse: 70 72  Resp: (!) 21 20  Temp:    SpO2: 98% 99%   Vitals:   03/05/22 1000 03/05/22 1006 03/05/22 1015 03/05/22 1025  BP: 106/77 (!) 86/68 93/78 100/72  Pulse: 66 66 70 72  Resp: (!) 27 14 (!) 21 20  Temp: 97.8 F (36.6 C)     TempSrc:      SpO2: 99% 95% 98% 99%  Weight:      Height:        General: Pt is alert, awake, not in acute distress, obese Cardiovascular: RRR, S1/S2 +, no rubs, no gallops Respiratory: CTA bilaterally, no wheezing, no rhonchi Abdominal: Soft, NT, ND, bowel sounds + Extremities: no edema, no cyanosis    The results of significant diagnostics from this hospitalization (including imaging, microbiology, ancillary and laboratory) are listed below for reference.     Microbiology: Recent Results (from the past 240 hour(s))  MRSA Next Gen by PCR, Nasal     Status: None   Collection Time: 03/03/22  3:06 PM    Specimen: Nasal Mucosa; Nasal Swab  Result Value Ref Range Status   MRSA by PCR Next Gen NOT DETECTED NOT DETECTED Final    Comment: (NOTE) The GeneXpert MRSA Assay (FDA approved for NASAL specimens only), is one component of a comprehensive MRSA colonization surveillance program. It is not intended to diagnose MRSA infection nor to guide or monitor treatment for MRSA infections. Test performance is not FDA approved in patients less than 41 years old. Performed at [facility], [address] [place], [place] [address]  Labs: BNP (last 3 results) No results for input(s): "BNP" in the last 8760 hours. Basic Metabolic Panel: Recent Labs  Lab 03/03/22 0955 03/04/22 0418 03/05/22 0405  NA 135 133* 135  K 4.4 4.5 4.0  CL 100 99 100  CO2 '25 27 29  ' GLUCOSE 388* 293* 248*  BUN '14 13 17  ' CREATININE 0.90 0.87 0.98  CALCIUM 9.0 8.8* 8.8*  MG 1.9 1.9 2.1   Liver Function Tests: No results for input(s): "AST", "ALT", "ALKPHOS", "BILITOT", "PROT", "ALBUMIN" in the last 168 hours. No results for input(s): "LIPASE", "AMYLASE" in the last 168 hours. No results for input(s): "AMMONIA" in the last 168 hours. CBC: Recent Labs  Lab 03/03/22 0955 03/04/22 0418  WBC 9.9 9.4  NEUTROABS 7.4  --   HGB 15.8 14.7  HCT 44.9 42.5  MCV 89.4 90.2  PLT 192 188   Cardiac Enzymes: No results for input(s): "CKTOTAL", "CKMB", "CKMBINDEX", "TROPONINI" in the last 168 hours. BNP: Invalid input(s): "POCBNP" CBG: Recent Labs  Lab 03/04/22 0805 03/04/22 1128 03/04/22 1650 03/04/22 2118 03/05/22 0741  GLUCAP 411* 323* 361* 293* 277*   D-Dimer No results for input(s): "DDIMER" in the last 72 hours. Hgb A1c Recent Labs    03/03/22 1241  HGBA1C 11.0*   Lipid Profile No results for input(s): "CHOL", "HDL", "LDLCALC", "TRIG", "CHOLHDL", "LDLDIRECT" in the last 72 hours. Thyroid function studies Recent Labs    03/04/22 0418  TSH 1.213   Anemia work up No results for input(s):  "VITAMINB12", "FOLATE", "FERRITIN", "TIBC", "IRON", "RETICCTPCT" in the last 72 hours. Urinalysis    Component Value Date/Time   COLORURINE YELLOW 05/22/2016 1001   APPEARANCEUR CLEAR 05/22/2016 1001   LABSPEC 1.010 05/22/2016 1001   PHURINE 7.0 05/22/2016 1001   GLUCOSEU NEGATIVE 05/22/2016 1001   HGBUR NEGATIVE 05/22/2016 1001   BILIRUBINUR NEGATIVE 05/22/2016 1001   KETONESUR 40 (A) 05/22/2016 1001   PROTEINUR NEGATIVE 05/22/2016 1001   NITRITE NEGATIVE 05/22/2016 1001   LEUKOCYTESUR NEGATIVE 05/22/2016 1001   Sepsis Labs Recent Labs  Lab 03/03/22 0955 03/04/22 0418  WBC 9.9 9.4   Microbiology Recent Results (from the past 240 hour(s))  MRSA Next Gen by PCR, Nasal     Status: None   Collection Time: 03/03/22  3:06 PM   Specimen: Nasal Mucosa; Nasal Swab  Result Value Ref Range Status   MRSA by PCR Next Gen NOT DETECTED NOT DETECTED Final    Comment: (NOTE) The GeneXpert MRSA Assay (FDA approved for NASAL specimens only), is one component of a comprehensive MRSA colonization surveillance program. It is not intended to diagnose MRSA infection nor to guide or monitor treatment for MRSA infections. Test performance is not FDA approved in patients less than 72 years old. Performed at [facility], [address], [place], [place] [address]      Time coordinating discharge: 35 minutes  SIGNED:   Rodena Goldmann, DO [facility] 03/05/2022, 11:12 AM  If 7PM-7AM, please contact night-coverage [email or web address]

## 2022-03-05 NOTE — Interval H&P Note (Signed)
History and Physical Interval Note:  03/05/2022 8:37 AM  Kenneth Bridges  has presented today for surgery, with the diagnosis of a-fib.  The various methods of treatment have been discussed with the patient and family. After consideration of risks, benefits and other options for treatment, the patient has consented to  Procedure(s): CARDIOVERSION (N/A) TRANSESOPHAGEAL ECHOCARDIOGRAM (TEE) (N/A) as a surgical intervention.  The patient's history has been reviewed, patient examined, no change in status, stable for surgery.  I have reviewed the patient's chart and labs.  Questions were answered to the patient's satisfaction.     Hitoshi Werts A Veda Arrellano

## 2022-03-05 NOTE — H&P (View-Only) (Signed)
Progress Note  Patient Name: Kenneth Bridges Date of Encounter: 03/05/2022  [facility] Cardiologist: [facility]   Subjective   NPO for TEE, DCCV, notes that he feels better. Diltiazem IV Is still on, despite this has rates 110s- 140ss (with activite)  Inpatient Medications    Scheduled Meds:  apixaban  5 mg Oral BID   Chlorhexidine Gluconate Cloth  6 each Topical Daily   irbesartan  300 mg Oral Daily   And   hydrochlorothiazide  12.5 mg Oral Daily   insulin aspart  0-20 Units Subcutaneous TID WC   insulin aspart  0-5 Units Subcutaneous QHS   insulin aspart  4 Units Subcutaneous TID WC   insulin glargine-yfgn  10 Units Subcutaneous Daily   metoprolol tartrate  50 mg Oral TID   Continuous Infusions:  diltiazem (CARDIZEM) infusion 10 mg/hr (03/04/22 1839)   PRN Meds: acetaminophen **OR** acetaminophen, ondansetron **OR** ondansetron (ZOFRAN) IV, mouth rinse   Vital Signs    Vitals:   03/05/22 0400 03/05/22 0500 03/05/22 0600 03/05/22 0759  BP: 101/78 98/75    Pulse: 70 (!) 48    Resp: 14 (!) 9    Temp:    97.6 F (36.4 C)  TempSrc:    Oral  SpO2: 98% 94%    Weight:   (!) 136.6 kg   Height:        Intake/Output Summary (Last 24 hours) at 03/05/2022 0832 Last data filed at 03/05/2022 0831 Gross per 24 hour  Intake 236.42 ml  Output 2600 ml  Net -2363.58 ml      03/05/2022    6:00 AM 03/04/2022    5:00 AM 03/03/2022   12:00 PM  Last 3 Weights  Weight (lbs) 301 lb 2.4 oz 302 lb 14.4 oz 307 lb 8.7 oz  Weight (kg) 136.6 kg 137.395 kg 139.5 kg      Telemetry    AF RVR with Ashman beats, rare PVCs- Personally Reviewed  ECG    No new ECG - Personally Reviewed  Physical Exam   GEN: No acute distress.   Neck: Difficult to assess JVD given habitus, Left ear Frank's Sign Cardiac: irregular, tachycardia,  no murmurs, rubs, or gallops.  Respiratory: CTAB, Normal rate and effort GI: Soft, nontender, non-distended  MS: No edema Neuro:  Nonfocal   Psych: Normal affect   Labs    High Sensitivity Troponin:   Recent Labs  Lab 03/03/22 0955 03/03/22 1129  TROPONINIHS 11 11     Chemistry Recent Labs  Lab 03/03/22 0955 03/04/22 0418 03/05/22 0405  NA 135 133* 135  K 4.4 4.5 4.0  CL 100 99 100  CO2 25 27 29   GLUCOSE 388* 293* 248*  BUN 14 13 17   CREATININE 0.90 0.87 0.98  CALCIUM 9.0 8.8* 8.8*  MG 1.9 1.9 2.1  GFRNONAA >60 >60 >60  ANIONGAP 10 7 6     Lipids No results for input(s): "CHOL", "TRIG", "HDL", "LABVLDL", "LDLCALC", "CHOLHDL" in the last 168 hours.  Hematology Recent Labs  Lab 03/03/22 0955 03/04/22 0418  WBC 9.9 9.4  RBC 5.02 4.71  HGB 15.8 14.7  HCT 44.9 42.5  MCV 89.4 90.2  MCH 31.5 31.2  MCHC 35.2 34.6  RDW 12.5 12.5  PLT 192 188   Thyroid  Recent Labs  Lab 03/04/22 0418  TSH 1.213    BNPNo results for input(s): "BNP", "PROBNP" in the last 168 hours.  DDimer No results for input(s): "DDIMER" in the last 168 hours.  Radiology    ECHOCARDIOGRAM COMPLETE  Result Date: 03/04/2022    ECHOCARDIOGRAM REPORT   Patient Name:   Kenneth Bridges Date of Exam: 03/04/2022 Medical Rec #:  [id number]           Height:       70.0 in Accession #:    [id number]          Weight:       302.9 lb Date of Birth:  Jul 22, 1966          BSA:          2.490 m Patient Age:    56 years            BP:           113/91 mmHg Patient Gender: M                   HR:           110 bpm. Exam Location:  [name] Procedure: 2D Echo, Cardiac Doppler, Color Doppler and Intracardiac            Opacification Agent Indications:    Atrial Fibrillation  History:        Patient has no prior history of Echocardiogram examinations.                 Arrythmias:Atrial Fibrillation; Risk Factors:Hypertension and                 Diabetes.  Sonographer:    Wenda Low Referring Phys: [id number] Erma Heritage  Sonographer Comments: Patient is morbidly obese. IMPRESSIONS  1. Left ventricular ejection fraction, by estimation, is 50 to  55%. The left ventricle has low normal function. The left ventricle demonstrates regional wall motion abnormalities. Apical hypokinesis. There is mild left ventricular hypertrophy. Left ventricular diastolic parameters are indeterminate.  2. Right ventricular systolic function is normal. The right ventricular size is normal.  3. Left atrial size was mildly dilated.  4. Right atrial size was mildly dilated.  5. The mitral valve is normal in structure. Trivial mitral valve regurgitation. No evidence of mitral stenosis.  6. The aortic valve was not well visualized. Aortic valve regurgitation is not visualized. No aortic stenosis is present.  7. Aortic dilatation noted. There is dilatation of the aortic root, measuring 45 mm. There is dilatation of the ascending aorta, measuring 40 mm.  8. The inferior vena cava is dilated in size with >50% respiratory variability, suggesting right atrial pressure of 8 mmHg. FINDINGS  Left Ventricle: Left ventricular ejection fraction, by estimation, is 50 to 55%. The left ventricle has low normal function. The left ventricle demonstrates regional wall motion abnormalities. Definity contrast agent was given IV to delineate the left ventricular endocardial borders. The left ventricular internal cavity size was normal in size. There is mild left ventricular hypertrophy. Left ventricular diastolic parameters are indeterminate. Right Ventricle: The right ventricular size is normal. No increase in right ventricular wall thickness. Right ventricular systolic function is normal. Left Atrium: Left atrial size was mildly dilated. Right Atrium: Right atrial size was mildly dilated. Pericardium: There is no evidence of pericardial effusion. Mitral Valve: The mitral valve is normal in structure. Trivial mitral valve regurgitation. No evidence of mitral valve stenosis. MV peak gradient, 7.2 mmHg. The mean mitral valve gradient is 2.5 mmHg. Tricuspid Valve: The tricuspid valve is normal in structure.  Tricuspid valve regurgitation is trivial. Aortic Valve: The aortic valve was not well visualized. Aortic valve regurgitation  is not visualized. No aortic stenosis is present. Aortic valve mean gradient measures 2.3 mmHg. Aortic valve peak gradient measures 4.5 mmHg. Aortic valve area, by VTI measures 3.25 cm. Pulmonic Valve: The pulmonic valve was not well visualized. Pulmonic valve regurgitation is not visualized. Aorta: Aortic dilatation noted. There is dilatation of the aortic root, measuring 45 mm. There is dilatation of the ascending aorta, measuring 40 mm. Venous: The inferior vena cava is dilated in size with greater than 50% respiratory variability, suggesting right atrial pressure of 8 mmHg. IAS/Shunts: The interatrial septum was not well visualized.  LEFT VENTRICLE PLAX 2D LVIDd:         5.50 cm   Diastology LVIDs:         4.15 cm   LV e' lateral:   9.03 cm/s LV PW:         1.10 cm   LV E/e' lateral: 13.2 LV IVS:        1.20 cm LVOT diam:     2.10 cm LV SV:         64 LV SV Index:   26 LVOT Area:     3.46 cm  RIGHT VENTRICLE RV Basal diam:  4.00 cm RV Mid diam:    3.60 cm RV S prime:     15.40 cm/s LEFT ATRIUM              Index        RIGHT ATRIUM           Index LA diam:        4.70 cm  1.89 cm/m   RA Area:     25.60 cm LA Vol (A2C):   112.0 ml 44.98 ml/m  RA Volume:   83.80 ml  33.65 ml/m LA Vol (A4C):   85.6 ml  34.38 ml/m LA Biplane Vol: 101.0 ml 40.56 ml/m  AORTIC VALVE                    PULMONIC VALVE AV Area (Vmax):    3.07 cm     PV Vmax:       0.61 m/s AV Area (Vmean):   3.37 cm     PV Peak grad:  1.5 mmHg AV Area (VTI):     3.25 cm AV Vmax:           106.00 cm/s AV Vmean:          71.567 cm/s AV VTI:            0.195 m AV Peak Grad:      4.5 mmHg AV Mean Grad:      2.3 mmHg LVOT Vmax:         94.00 cm/s LVOT Vmean:        69.650 cm/s LVOT VTI:          0.184 m LVOT/AV VTI ratio: 0.94  AORTA Ao Root diam: 4.50 cm Ao Asc diam:  4.00 cm MITRAL VALVE MV Area (PHT): 4.15 cm     SHUNTS MV  Area VTI:   2.96 cm     Systemic VTI:  0.18 m MV Peak grad:  7.2 mmHg     Systemic Diam: 2.10 cm MV Mean grad:  2.5 mmHg MV Vmax:       1.34 m/s MV Vmean:      67.8 cm/s MV Decel Time: 183 msec MV E velocity: 119.50 cm/s MV A velocity: 9.68 cm/s MV E/A ratio:  12.35 Epifanio Lesches MD  Electronically signed by Oswaldo Milian MD Signature Date/Time: 03/04/2022/10:19:45 AM    Final    DG Chest Port 1 View  Result Date: 03/03/2022 CLINICAL DATA:  56 year old male with history of fatigue and shortness of breath for the past 2 weeks. EXAM: PORTABLE CHEST 1 VIEW COMPARISON:  Chest x-ray 11/19/2013. FINDINGS: Ill-defined opacity in the base of the left hemithorax obscuring the left hemidiaphragm in the left costophrenic sulcus. Diffuse interstitial prominence and peribronchial cuffing bilaterally. No pneumothorax. No evidence of pulmonary edema. Heart size is borderline enlarged, accentuated by portable AP technique. Prominent soft tissue in the paratracheal regions bilaterally. IMPRESSION: 1. Ill-defined opacity at the left base which may reflect atelectasis and/or consolidation with superimposed small left pleural effusion. 2. Diffuse peribronchial cuffing, concerning for an acute bronchitis. 3. Prominence of paratracheal soft tissues bilaterally concerning for potential lymphadenopathy. Follow-up contrast enhanced chest CT is suggested in the near future to better evaluate these findings. Electronically Signed   By: Vinnie Langton M.D.   On: 03/03/2022 10:13    Cardiac Studies     Patient Profile     56 y.o. male with morbid obesity, OSA noncompliant with CPAP, type 2 diabetes, and hypertension presents with new A-fib  Assessment & Plan    Atrial fibrillation with RVR: new diagnosis. CHA2DS2-VASc score 2 (hypertension, diabetes) -Continue Eliquis 5 mg twice daily - Planned for TEE/DCC today - if failed we will switch to amiodarone and a rhythm control strategy  Acute diastolic heart  failure:  - Lasix 40 mg PO daily - aldactone 12.5 mg PO daily  OSA: Recommend compliance with CPAP.  Suspect untreated OSA is cause of his Afib  Hypertension: Continue metoprolol can stop decrease Iresbsartan dose if needed; I have stopped his HCTZ dose  Morbid obesity: Body mass index is 43.21 kg/m.   For questions or updates, please contact [place] Please consult [email or web address] for contact info under        Signed, Werner Lean, MD  03/05/2022, 8:32 AM

## 2022-03-05 NOTE — Anesthesia Preprocedure Evaluation (Signed)
Anesthesia Evaluation  Patient identified by MRN, date of birth, ID band Patient awake    Reviewed: Allergy & Precautions, H&P , NPO status , Patient's Chart, lab work & pertinent test results, reviewed documented beta blocker date and time   Airway Mallampati: II  TM Distance: >3 FB Neck ROM: full    Dental no notable dental hx.    Pulmonary neg pulmonary ROS,    Pulmonary exam normal breath sounds clear to auscultation       Cardiovascular Exercise Tolerance: Good hypertension, + dysrhythmias Atrial Fibrillation  Rhythm:irregular Rate:Tachycardia     Neuro/Psych negative neurological ROS  negative psych ROS   GI/Hepatic negative GI ROS, Neg liver ROS,   Endo/Other  diabetes, Poorly Controlled, Type obesity  Renal/GU negative Renal ROS  negative genitourinary   Musculoskeletal   Abdominal   Peds  Hematology negative hematology ROS (+)   Anesthesia Other Findings   Reproductive/Obstetrics negative OB ROS                             Anesthesia Physical Anesthesia Plan  ASA: 4 and emergent  Anesthesia Plan: General   Post-op Pain Management:    Induction:   PONV Risk Score and Plan: Propofol infusion  Airway Management Planned:   Additional Equipment:   Intra-op Plan:   Post-operative Plan:   Informed Consent: I have reviewed the patients History and Physical, chart, labs and discussed the procedure including the risks, benefits and alternatives for the proposed anesthesia with the patient or authorized representative who has indicated his/her understanding and acceptance.     Dental Advisory Given  Plan Discussed with: CRNA  Anesthesia Plan Comments:         Anesthesia Quick Evaluation

## 2022-03-05 NOTE — Progress Notes (Addendum)
Dr Izora Ribas at bedside to check pt. Instructed to d/c cardizem drip. Dr Izora Ribas went to ER to check another pt and will return when ready for procedure.

## 2022-03-05 NOTE — CV Procedure (Signed)
   TRANSESOPHAGEAL ECHOCARDIOGRAM GUIDED DIRECT CURRENT CARDIOVERSION  NAME:  Kenneth Bridges    MRN: [id number] DOB:  September 03, 1965    ADMIT DATE: 03/03/2022  INDICATIONS: Symptomatic atrial fibrillation  PROCEDURE:   Informed consent was obtained prior to the procedure. The risks, benefits and alternatives for the procedure were discussed and the patient comprehended these risks.  Risks include, but are not limited to, cough, sore throat, vomiting, nausea, somnolence, esophageal and stomach trauma or perforation, bleeding, low blood pressure, aspiration, pneumonia, infection, trauma to the teeth and death.    After a procedural time-out, the oropharynx was anesthetized and the patient was sedated by the anesthesia service. The transesophageal probe was inserted in the esophagus and stomach without difficulty and multiple views were obtained. Anesthesia was monitored by Dr. Johnnette Litter.   COMPLICATIONS:    Complications: No complications Patient tolerated procedure well.  KEY FINDINGS:  Low normal LVEF Mild mitral regurgitation Trivial Aortic regurgitation Incident [facility] (normal variant) Descend aortic atherosclerosis. Full Report to follow.   CARDIOVERSION:     Indications:  Symptomatic Atrial Fibrillation  Procedure Details:  Once the TEE was complete, the patient had the defibrillator pads placed in the anterior and posterior position. Once an appropriate level of sedation was confirmed, the patient was cardioverted x 1 with 200J of biphasic synchronized energy.  The patient converted to NSR.  There were no apparent complications.  The patient had normal neuro status and respiratory status post procedure with vitals stable as recorded elsewhere.  Adequate airway was maintained throughout and vital signs monitored per protocol.  Riley Lam, MD FASE [place]  [facility]  9:54 AM

## 2022-03-05 NOTE — Transfer of Care (Signed)
Immediate Anesthesia Transfer of Care Note  Patient: SAFIR MICHALEC  Procedure(s) Performed: CARDIOVERSION TRANSESOPHAGEAL ECHOCARDIOGRAM (TEE)  Patient Location: PACU  Anesthesia Type:General  Level of Consciousness: awake  Airway & Oxygen Therapy: Patient Spontanous Breathing and Patient connected to nasal cannula oxygen  Post-op Assessment: Report given to RN and Post -op Vital signs reviewed and stable  Post vital signs: Reviewed and stable  Last Vitals:  Vitals Value Taken Time  BP 106/77 03/05/22 1000  Temp 36.6 C 03/05/22 1000  Pulse 68 03/05/22 1000  Resp 27 03/05/22 1000  SpO2 100 % 03/05/22 1000  Vitals shown include unvalidated device data.  Last Pain:  Vitals:   03/05/22 0759  TempSrc: Oral  PainSc:          Complications: No notable events documented.

## 2022-03-05 NOTE — Progress Notes (Signed)
*  PRELIMINARY RESULTS* Echocardiogram Echocardiogram Transesophageal has been performed.  Stacey Drain 03/05/2022, 10:14 AM

## 2022-03-05 NOTE — Progress Notes (Signed)
Electrical Cardioversion Procedure Note Kenneth Bridges [id number] 01-Aug-1966  Procedure: Electrical Cardioversion Indications:  Atrial Fibrillation  Procedure Details Consent: Risks of procedure as well as the alternatives and risks of each were explained to the (patient/caregiver).  Consent for procedure obtained. Time Out: Verified patient identification, verified procedure, site/side was marked, verified correct patient position, special equipment/implants available, medications/allergies/relevent history reviewed, required imaging and test results available.  Performed  Patient placed on cardiac monitor, pulse oximetry, supplemental oxygen as necessary.  Sedation given: Benzodiazepines Pacer pads placed anterior and posterior chest.  Cardioverted 1 time(s).  Cardioverted at 200J.  Evaluation Findings: Post procedure EKG shows: NSR Complications: None Patient did tolerate procedure well.   Kenneth Bridges 03/05/2022, 11:16 AM

## 2022-03-05 NOTE — Progress Notes (Signed)
Progress Note  Patient Name: OSMANY AZER Date of Encounter: 03/05/2022  [facility] Cardiologist: [facility]   Subjective   NPO for TEE, DCCV, notes that he feels better. Diltiazem IV Is still on, despite this has rates 110s- 140ss (with activite)  Inpatient Medications    Scheduled Meds:  apixaban  5 mg Oral BID   Chlorhexidine Gluconate Cloth  6 each Topical Daily   irbesartan  300 mg Oral Daily   And   hydrochlorothiazide  12.5 mg Oral Daily   insulin aspart  0-20 Units Subcutaneous TID WC   insulin aspart  0-5 Units Subcutaneous QHS   insulin aspart  4 Units Subcutaneous TID WC   insulin glargine-yfgn  10 Units Subcutaneous Daily   metoprolol tartrate  50 mg Oral TID   Continuous Infusions:  diltiazem (CARDIZEM) infusion 10 mg/hr (03/04/22 1839)   PRN Meds: acetaminophen **OR** acetaminophen, ondansetron **OR** ondansetron (ZOFRAN) IV, mouth rinse   Vital Signs    Vitals:   03/05/22 0400 03/05/22 0500 03/05/22 0600 03/05/22 0759  BP: 101/78 98/75    Pulse: 70 (!) 48    Resp: 14 (!) 9    Temp:    97.6 F (36.4 C)  TempSrc:    Oral  SpO2: 98% 94%    Weight:   (!) 136.6 kg   Height:        Intake/Output Summary (Last 24 hours) at 03/05/2022 0832 Last data filed at 03/05/2022 0831 Gross per 24 hour  Intake 236.42 ml  Output 2600 ml  Net -2363.58 ml      03/05/2022    6:00 AM 03/04/2022    5:00 AM 03/03/2022   12:00 PM  Last 3 Weights  Weight (lbs) 301 lb 2.4 oz 302 lb 14.4 oz 307 lb 8.7 oz  Weight (kg) 136.6 kg 137.395 kg 139.5 kg      Telemetry    AF RVR with Ashman beats, rare PVCs- Personally Reviewed  ECG    No new ECG - Personally Reviewed  Physical Exam   GEN: No acute distress.   Neck: Difficult to assess JVD given habitus, Left ear Frank's Sign Cardiac: irregular, tachycardia,  no murmurs, rubs, or gallops.  Respiratory: CTAB, Normal rate and effort GI: Soft, nontender, non-distended  MS: No edema Neuro:  Nonfocal   Psych: Normal affect   Labs    High Sensitivity Troponin:   Recent Labs  Lab 03/03/22 0955 03/03/22 1129  TROPONINIHS 11 11     Chemistry Recent Labs  Lab 03/03/22 0955 03/04/22 0418 03/05/22 0405  NA 135 133* 135  K 4.4 4.5 4.0  CL 100 99 100  CO2 25 27 29   GLUCOSE 388* 293* 248*  BUN 14 13 17   CREATININE 0.90 0.87 0.98  CALCIUM 9.0 8.8* 8.8*  MG 1.9 1.9 2.1  GFRNONAA >60 >60 >60  ANIONGAP 10 7 6     Lipids No results for input(s): "CHOL", "TRIG", "HDL", "LABVLDL", "LDLCALC", "CHOLHDL" in the last 168 hours.  Hematology Recent Labs  Lab 03/03/22 0955 03/04/22 0418  WBC 9.9 9.4  RBC 5.02 4.71  HGB 15.8 14.7  HCT 44.9 42.5  MCV 89.4 90.2  MCH 31.5 31.2  MCHC 35.2 34.6  RDW 12.5 12.5  PLT 192 188   Thyroid  Recent Labs  Lab 03/04/22 0418  TSH 1.213    BNPNo results for input(s): "BNP", "PROBNP" in the last 168 hours.  DDimer No results for input(s): "DDIMER" in the last 168 hours.  Radiology    ECHOCARDIOGRAM COMPLETE  Result Date: 03/04/2022    ECHOCARDIOGRAM REPORT   Patient Name:   LAFRANCE CRISTIAN Date of Exam: 03/04/2022 Medical Rec #:  [id number]           Height:       70.0 in Accession #:    [id number]          Weight:       302.9 lb Date of Birth:  1966-04-03          BSA:          2.490 m Patient Age:    78 years            BP:           113/91 mmHg Patient Gender: M                   HR:           110 bpm. Exam Location:  [name] Procedure: 2D Echo, Cardiac Doppler, Color Doppler and Intracardiac            Opacification Agent Indications:    Atrial Fibrillation  History:        Patient has no prior history of Echocardiogram examinations.                 Arrythmias:Atrial Fibrillation; Risk Factors:Hypertension and                 Diabetes.  Sonographer:    Wenda Low Referring Phys: [id number] Erma Heritage  Sonographer Comments: Patient is morbidly obese. IMPRESSIONS  1. Left ventricular ejection fraction, by estimation, is 50 to  55%. The left ventricle has low normal function. The left ventricle demonstrates regional wall motion abnormalities. Apical hypokinesis. There is mild left ventricular hypertrophy. Left ventricular diastolic parameters are indeterminate.  2. Right ventricular systolic function is normal. The right ventricular size is normal.  3. Left atrial size was mildly dilated.  4. Right atrial size was mildly dilated.  5. The mitral valve is normal in structure. Trivial mitral valve regurgitation. No evidence of mitral stenosis.  6. The aortic valve was not well visualized. Aortic valve regurgitation is not visualized. No aortic stenosis is present.  7. Aortic dilatation noted. There is dilatation of the aortic root, measuring 45 mm. There is dilatation of the ascending aorta, measuring 40 mm.  8. The inferior vena cava is dilated in size with >50% respiratory variability, suggesting right atrial pressure of 8 mmHg. FINDINGS  Left Ventricle: Left ventricular ejection fraction, by estimation, is 50 to 55%. The left ventricle has low normal function. The left ventricle demonstrates regional wall motion abnormalities. Definity contrast agent was given IV to delineate the left ventricular endocardial borders. The left ventricular internal cavity size was normal in size. There is mild left ventricular hypertrophy. Left ventricular diastolic parameters are indeterminate. Right Ventricle: The right ventricular size is normal. No increase in right ventricular wall thickness. Right ventricular systolic function is normal. Left Atrium: Left atrial size was mildly dilated. Right Atrium: Right atrial size was mildly dilated. Pericardium: There is no evidence of pericardial effusion. Mitral Valve: The mitral valve is normal in structure. Trivial mitral valve regurgitation. No evidence of mitral valve stenosis. MV peak gradient, 7.2 mmHg. The mean mitral valve gradient is 2.5 mmHg. Tricuspid Valve: The tricuspid valve is normal in structure.  Tricuspid valve regurgitation is trivial. Aortic Valve: The aortic valve was not well visualized. Aortic valve regurgitation  is not visualized. No aortic stenosis is present. Aortic valve mean gradient measures 2.3 mmHg. Aortic valve peak gradient measures 4.5 mmHg. Aortic valve area, by VTI measures 3.25 cm. Pulmonic Valve: The pulmonic valve was not well visualized. Pulmonic valve regurgitation is not visualized. Aorta: Aortic dilatation noted. There is dilatation of the aortic root, measuring 45 mm. There is dilatation of the ascending aorta, measuring 40 mm. Venous: The inferior vena cava is dilated in size with greater than 50% respiratory variability, suggesting right atrial pressure of 8 mmHg. IAS/Shunts: The interatrial septum was not well visualized.  LEFT VENTRICLE PLAX 2D LVIDd:         5.50 cm   Diastology LVIDs:         4.15 cm   LV e' lateral:   9.03 cm/s LV PW:         1.10 cm   LV E/e' lateral: 13.2 LV IVS:        1.20 cm LVOT diam:     2.10 cm LV SV:         64 LV SV Index:   26 LVOT Area:     3.46 cm  RIGHT VENTRICLE RV Basal diam:  4.00 cm RV Mid diam:    3.60 cm RV S prime:     15.40 cm/s LEFT ATRIUM              Index        RIGHT ATRIUM           Index LA diam:        4.70 cm  1.89 cm/m   RA Area:     25.60 cm LA Vol (A2C):   112.0 ml 44.98 ml/m  RA Volume:   83.80 ml  33.65 ml/m LA Vol (A4C):   85.6 ml  34.38 ml/m LA Biplane Vol: 101.0 ml 40.56 ml/m  AORTIC VALVE                    PULMONIC VALVE AV Area (Vmax):    3.07 cm     PV Vmax:       0.61 m/s AV Area (Vmean):   3.37 cm     PV Peak grad:  1.5 mmHg AV Area (VTI):     3.25 cm AV Vmax:           106.00 cm/s AV Vmean:          71.567 cm/s AV VTI:            0.195 m AV Peak Grad:      4.5 mmHg AV Mean Grad:      2.3 mmHg LVOT Vmax:         94.00 cm/s LVOT Vmean:        69.650 cm/s LVOT VTI:          0.184 m LVOT/AV VTI ratio: 0.94  AORTA Ao Root diam: 4.50 cm Ao Asc diam:  4.00 cm MITRAL VALVE MV Area (PHT): 4.15 cm     SHUNTS MV  Area VTI:   2.96 cm     Systemic VTI:  0.18 m MV Peak grad:  7.2 mmHg     Systemic Diam: 2.10 cm MV Mean grad:  2.5 mmHg MV Vmax:       1.34 m/s MV Vmean:      67.8 cm/s MV Decel Time: 183 msec MV E velocity: 119.50 cm/s MV A velocity: 9.68 cm/s MV E/A ratio:  12.35 Epifanio Lesches MD  Electronically signed by Oswaldo Milian MD Signature Date/Time: 03/04/2022/10:19:45 AM    Final    DG Chest Port 1 View  Result Date: 03/03/2022 CLINICAL DATA:  56 year old male with history of fatigue and shortness of breath for the past 2 weeks. EXAM: PORTABLE CHEST 1 VIEW COMPARISON:  Chest x-ray 11/19/2013. FINDINGS: Ill-defined opacity in the base of the left hemithorax obscuring the left hemidiaphragm in the left costophrenic sulcus. Diffuse interstitial prominence and peribronchial cuffing bilaterally. No pneumothorax. No evidence of pulmonary edema. Heart size is borderline enlarged, accentuated by portable AP technique. Prominent soft tissue in the paratracheal regions bilaterally. IMPRESSION: 1. Ill-defined opacity at the left base which may reflect atelectasis and/or consolidation with superimposed small left pleural effusion. 2. Diffuse peribronchial cuffing, concerning for an acute bronchitis. 3. Prominence of paratracheal soft tissues bilaterally concerning for potential lymphadenopathy. Follow-up contrast enhanced chest CT is suggested in the near future to better evaluate these findings. Electronically Signed   By: Vinnie Langton M.D.   On: 03/03/2022 10:13    Cardiac Studies     Patient Profile     55 y.o. male with morbid obesity, OSA noncompliant with CPAP, type 2 diabetes, and hypertension presents with new A-fib  Assessment & Plan    Atrial fibrillation with RVR: new diagnosis. CHA2DS2-VASc score 2 (hypertension, diabetes) -Continue Eliquis 5 mg twice daily - Planned for TEE/DCC today - if failed we will switch to amiodarone and a rhythm control strategy  Acute diastolic heart  failure:  - Lasix 40 mg PO daily - aldactone 12.5 mg PO daily  OSA: Recommend compliance with CPAP.  Suspect untreated OSA is cause of his Afib  Hypertension: Continue metoprolol can stop decrease Iresbsartan dose if needed; I have stopped his HCTZ dose  Morbid obesity: Body mass index is 43.21 kg/m.   For questions or updates, please contact [place] Please consult [email or web address] for contact info under        Signed, Werner Lean, MD  03/05/2022, 8:32 AM

## 2022-03-07 NOTE — Anesthesia Postprocedure Evaluation (Signed)
Anesthesia Post Note  Patient: Kenneth Bridges  Procedure(s) Performed: CARDIOVERSION TRANSESOPHAGEAL ECHOCARDIOGRAM (TEE)  Patient location during evaluation: Phase II Anesthesia Type: General Level of consciousness: awake Pain management: pain level controlled Vital Signs Assessment: post-procedure vital signs reviewed and stable Respiratory status: spontaneous breathing and respiratory function stable Cardiovascular status: blood pressure returned to baseline and stable Postop Assessment: no headache and no apparent nausea or vomiting Anesthetic complications: no Comments: Late entry   No notable events documented.   Last Vitals:  Vitals:   03/05/22 1334 03/05/22 1346  BP: 117/83 119/79  Pulse: 60 66  Resp:    Temp:    SpO2: 93% 97%    Last Pain:  Vitals:   03/05/22 1346  TempSrc:   PainSc: 0-No pain                 Windell Norfolk

## 2022-03-08 ENCOUNTER — Encounter (HOSPITAL_COMMUNITY): Payer: Self-pay | Admitting: Internal Medicine

## 2022-03-23 ENCOUNTER — Encounter: Payer: Self-pay | Admitting: Student

## 2022-03-23 ENCOUNTER — Ambulatory Visit (INDEPENDENT_AMBULATORY_CARE_PROVIDER_SITE_OTHER): Payer: No Typology Code available for payment source | Admitting: Student

## 2022-03-23 ENCOUNTER — Other Ambulatory Visit (HOSPITAL_COMMUNITY)
Admission: RE | Admit: 2022-03-23 | Discharge: 2022-03-23 | Disposition: A | Discharge status: Home / Self Care | Payer: No Typology Code available for payment source | Source: Ambulatory Visit | Attending: Student | Admitting: Student

## 2022-03-23 VITALS — BP 102/60 | HR 69 | Ht 70.0 in | Wt 295.6 lb

## 2022-03-23 DIAGNOSIS — Z79899 Other long term (current) drug therapy: Secondary | ICD-10-CM | POA: Diagnosis present

## 2022-03-23 DIAGNOSIS — I10 Essential (primary) hypertension: Secondary | ICD-10-CM | POA: Diagnosis not present

## 2022-03-23 DIAGNOSIS — I4819 Other persistent atrial fibrillation: Secondary | ICD-10-CM | POA: Diagnosis not present

## 2022-03-23 DIAGNOSIS — Z794 Long term (current) use of insulin: Secondary | ICD-10-CM

## 2022-03-23 DIAGNOSIS — I77810 Thoracic aortic ectasia: Secondary | ICD-10-CM | POA: Diagnosis not present

## 2022-03-23 DIAGNOSIS — E119 Type 2 diabetes mellitus without complications: Secondary | ICD-10-CM

## 2022-03-23 LAB — BASIC METABOLIC PANEL
Anion gap: 11 (ref 5–15)
BUN: 23 mg/dL — ABNORMAL HIGH (ref 6–20)
CO2: 25 mmol/L (ref 22–32)
Calcium: 9 mg/dL (ref 8.9–10.3)
Chloride: 99 mmol/L (ref 98–111)
Creatinine, Ser: 1.04 mg/dL (ref 0.61–1.24)
GFR, Estimated: >60 mL/min (ref >60)
Glucose, Bld: 298 mg/dL — ABNORMAL HIGH (ref 70–99)
Potassium: 4 mmol/L (ref 3.5–5.1)
Sodium: 135 mmol/L (ref 135–145)

## 2022-03-23 LAB — CBC
HCT: 44.5 % (ref 39.0–52.0)
Hemoglobin: 16 g/dL (ref 13.0–17.0)
MCH: 31.2 pg (ref 26.0–34.0)
MCHC: 36 g/dL (ref 30.0–36.0)
MCV: 86.7 fL (ref 80.0–100.0)
Platelets: 200 10*3/uL (ref 150–400)
RBC: 5.13 MIL/uL (ref 4.22–5.81)
RDW: 11.8 % (ref 11.5–15.5)
WBC: 10.8 10*3/uL — ABNORMAL HIGH (ref 4.0–10.5)
nRBC: 0 % (ref 0.0–0.2)

## 2022-03-23 MED ORDER — METOPROLOL TARTRATE 50 MG PO TABS: 50.0000 mg | ORAL_TABLET | Freq: Two times a day (BID) | ORAL | 1 refills | Status: DC

## 2022-03-23 MED ORDER — FUROSEMIDE 40 MG PO TABS
40.0000 mg | ORAL_TABLET | Freq: Every day | ORAL | 1 refills | Status: DC | PRN
Start: 2022-03-23 — End: 2023-02-08

## 2022-03-23 NOTE — Addendum Note (Signed)
Addended by: Kerney Elbe on: 03/23/2022 05:11 PM   Modules accepted: Orders

## 2022-03-23 NOTE — Patient Instructions (Signed)
Medication Instructions:   Decrease Lasix to as needed for fluid  Decrease Metoprolol to 50 mg Two Times Daily   *If you need a refill on your cardiac medications before your next appointment, please call your pharmacy*   Lab Work: Your physician recommends that you return for lab work in: Today ( CBC, BMET)   If you have labs (blood work) drawn today and your tests are completely normal, you will receive your results only by: MyChart Message (if you have MyChart) OR A paper copy in the mail If you have any lab test that is abnormal or we need to change your treatment, we will call you to review the results.   Testing/Procedures: NONE    Follow-Up: At [facility], you and your health needs are our priority.  As part of our continuing mission to provide you with exceptional heart care, we have created designated Provider Care Teams.  These Care Teams include your primary Cardiologist (physician) and Advanced Practice Providers (APPs -  Physician Assistants and Nurse Practitioners) who all work together to provide you with the care you need, when you need it.  We recommend signing up for the patient portal called "MyChart".  Sign up information is provided on this After Visit Summary.  MyChart is used to connect with patients for Virtual Visits (Telemedicine).  Patients are able to view lab/test results, encounter notes, upcoming appointments, etc.  Non-urgent messages can be sent to your provider as well.   To learn more about what you can do with MyChart, go to [email or web address].    Your next appointment:   3 month(s)  The format for your next appointment:   In Person  Provider:   You may see Charlton Haws, MD or one of the following Advanced Practice Providers on your designated Care Team:   Randall An, PA-C  Jacolyn Reedy, PA-C     Other Instructions Thank you for choosing  [facility]!    Important Information About Sugar

## 2022-03-23 NOTE — Progress Notes (Signed)
Cardiology Office Note    Date:  03/23/2022   ID:  [name], DOB 1965-11-23, MRN [id number]  PCP:  [facility]  Cardiologist: Charlton Haws, MD    Chief Complaint  Patient presents with   Hospitalization Follow-up    History of Present Illness:    Kenneth Bridges is a 56 y.o. male with past medical history of HTN, Type II DM and OSA who presents to the office today for hospital follow-up.  He was recently admitted to [facility] last month for new-onset atrial fibrillation with RVR and was initially started on IV Cardizem. Echocardiogram did show his EF was mildly reduced at 50 to 55% with apical hypokinesis and mildly dilated left atria. Was also noted to have dilatation of the aortic root measuring 45 mm and dilatation of the ascending aorta at 40 mm. He ultimately required TEE/DCCV which was performed on 03/05/2022 with conversion to NSR with a 200 J biphasic shock. Was discharged on Eliquis 5 mg twice daily and Lopressor 50 mg 3 times daily. Was also discharged on Lasix 40 mg daily and Spironolactone 12.5 mg daily for his cardiomyopathy. It appears he was continued on PTA Telmisartan-HCTZ 80-12.5 mg daily while also being started on Lasix.  In talking with the patient today, he reports overall doing well since his recent hospitalization. Says that his breathing and overall energy level have significantly improved. He denies any recent chest pain or palpitations. He has been checking his pulse at home with a Smart Watch and says his pulse has been well controlled in the 50's to 90's. No recent orthopnea, PND or pitting edema. He has been using his CPAP at night. Also reduced his alcohol use. He does experience occasional dizziness with positional changes. He remains on Eliquis for anticoagulation with no recent melena or hematochezia.   Past Medical History:  Diagnosis Date   Atrial fibrillation [facility])    a. 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)   Sleep apnea    Wears glasses     Past Surgical History:  Procedure Laterality Date   ANKLE FRACTURE SURGERY Left 1988   CARDIOVERSION N/A 03/05/2022   Procedure: CARDIOVERSION;  Surgeon: Christell Constant, MD;  Location: [facility] ORS;  Service: Cardiovascular;  Laterality: N/A;   COLONOSCOPY N/A 05/19/2016   Procedure: COLONOSCOPY;  Surgeon: Corbin Ade, MD;  Location: [facility] ENDO SUITE;  Service: Endoscopy;  Laterality: N/A;  9:30 am   HERNIA REPAIR Left    inguinal   KNEE ARTHROSCOPY WITH MEDIAL MENISECTOMY Right 08/28/2019   Procedure: KNEE ARTHROSCOPY WITH MEDIAL MENISCECTOMY;  Surgeon: Vickki Hearing, MD;  Location: [facility] ORS;  Service: Orthopedics;  Laterality: Right;   NASAL SEPTOPLASTY W/ TURBINOPLASTY  02/13/2016   NASAL SEPTOPLASTY WITH TURBINATE REDUCTION (Bilateral)   NASAL SEPTOPLASTY W/ TURBINOPLASTY Bilateral 02/13/2016   Procedure: NASAL SEPTOPLASTY WITH TURBINATE REDUCTION;  Surgeon: Christia Reading, MD;  [facility];  Service: ENT;  Laterality: Bilateral;   SYNDESMOSIS REPAIR Left 11/07/2020   Procedure: SYNDESMOSIS REPAIR AND MEDIAL DELTOID REPAIR LEFT ANKLE;  Surgeon: Vickki Hearing, MD;  Location: [facility] ORS;  Service: Orthopedics;  Laterality: Left;   TEE WITHOUT CARDIOVERSION N/A 03/05/2022   Procedure: TRANSESOPHAGEAL ECHOCARDIOGRAM (TEE);  Surgeon: Christell Constant, MD;  Location: [facility] ORS;  Service: Cardiovascular;  Laterality: N/A;    Current Medications: Outpatient  Medications Prior to Visit  Medication Sig Dispense Refill   apixaban (ELIQUIS) 5 MG TABS tablet Take 1 tablet (5 mg total) by mouth 2 (two) times daily. 120 tablet 0   blood glucose meter kit and supplies KIT Dispense based on patient and insurance preference. Use up to four times daily as directed. 1 each 0   hydrochlorothiazide (HYDRODIURIL) 25 MG tablet Take 25 mg by mouth daily.      insulin glargine (LANTUS) 100 UNIT/ML injection Inject 0.1 mLs (10 Units total) into the skin daily. 10 mL 3   insulin lispro (HUMALOG) 100 UNIT/ML cartridge Inject 0.04 mLs (4 Units total) into the skin 3 (three) times daily with meals. 15 mL 11   Insulin Syringe-Needle U-100 (INSULIN SYRINGE .5CC/31GX5/16") 31G X 5/16" 0.5 ML MISC Take as directed 100 each 3   metFORMIN (GLUCOPHAGE) 500 MG tablet Take 500 mg by mouth 2 (two) times daily with a meal.     sildenafil (VIAGRA) 100 MG tablet Take by mouth.     telmisartan (MICARDIS) 80 MG tablet Take 80 mg by mouth daily.     furosemide (LASIX) 40 MG tablet Take 1 tablet (40 mg total) by mouth daily. 30 tablet 1   metoprolol tartrate (LOPRESSOR) 50 MG tablet Take 1 tablet (50 mg total) by mouth 3 (three) times daily. 90 tablet 0   telmisartan-hydrochlorothiazide (MICARDIS HCT) 80-12.5 MG tablet Take 1 tablet by mouth daily.     spironolactone (ALDACTONE) 25 MG tablet Take 0.5 tablets (12.5 mg total) by mouth daily. (Patient taking differently: Take 12.5 mg by mouth 2 (two) times daily.) 15 tablet 0   No facility-administered medications prior to visit.     Allergies:   Patient has no known allergies.   Social History   Socioeconomic History   Marital status: Married    Spouse name: Not on file   Number of children: Not on file   Years of education: Not on file   Highest education level: Not on file  Occupational History   Not on file  Tobacco Use   Smoking status: Never   Smokeless tobacco: Never  Vaping Use   Vaping Use: Never used  Substance and Sexual Activity   Alcohol use: Not Currently    Comment: social beer drinker on the weekends (drinks 2 beers on Friday and 2 beers on Saturday)    Drug use: No   Sexual activity: Not on file  Other Topics Concern   Not on file  Social History Narrative   Not on file   Social Determinants of Health   Financial Resource Strain: Not on file  Food Insecurity: Not on file   Transportation Needs: Not on file  Physical Activity: Not on file  Stress: Not on file  Social Connections: Not on file     Family History:  The patient's family history includes Cancer in his mother.   Review of Systems:    Please see the history of present illness.     All other systems reviewed and are otherwise negative except as noted above.   Physical Exam:    VS:  BP 102/60   Pulse 69   Ht $R'5\' 10"'Js$  (1.778 m)   Wt 295 lb 9.6 oz (134.1 kg)   SpO2 95%   BMI 42.41 kg/m    General: Well developed, well nourished,male appearing in no acute distress. Head: Normocephalic, atraumatic. Neck: No carotid bruits. JVD not elevated.  Lungs: Respirations regular and unlabored, without wheezes or rales.  Heart: Regular rate and rhythm. No S3 or S4.  No murmur, no rubs, or gallops appreciated. Abdomen: Appears non-distended. No obvious abdominal masses. Msk:  Strength and tone appear normal for age. No obvious joint deformities or effusions. Extremities: No clubbing or cyanosis. No pitting edema.  Distal pedal pulses are 2+ bilaterally. Neuro: Alert and oriented X 3. Moves all extremities spontaneously. No focal deficits noted. Psych:  Responds to questions appropriately with a normal affect. Skin: No rashes or lesions noted  Wt Readings from Last 3 Encounters:  03/23/22 295 lb 9.6 oz (134.1 kg)  03/05/22 (!) 301 lb 2.4 oz (136.6 kg)  11/20/20 (!) 310 lb (140.6 kg)     Studies/Labs Reviewed:   EKG:  EKG is ordered today.  The ekg ordered today demonstrates NSR, HR 69 with RAD and incomplete RBBB.   Recent Labs: 03/04/2022: TSH 1.213 03/05/2022: Magnesium 2.1 03/23/2022: BUN 23; Creatinine, Ser 1.04; Hemoglobin 16.0; Platelets 200; Potassium 4.0; Sodium 135   Lipid Panel No results found for: "CHOL", "TRIG", "HDL", "CHOLHDL", "VLDL", "[place]", "LDLDIRECT"  Additional studies/ records that were reviewed today include:   Echocardiogram: 03/04/2022 IMPRESSIONS     1. Left  ventricular ejection fraction, by estimation, is 50 to 55%. The  left ventricle has low normal function. The left ventricle demonstrates  regional wall motion abnormalities. Apical hypokinesis. There is mild left  ventricular hypertrophy. Left  ventricular diastolic parameters are indeterminate.   2. Right ventricular systolic function is normal. The right ventricular  size is normal.   3. Left atrial size was mildly dilated.   4. Right atrial size was mildly dilated.   5. The mitral valve is normal in structure. Trivial mitral valve  regurgitation. No evidence of mitral stenosis.   6. The aortic valve was not well visualized. Aortic valve regurgitation  is not visualized. No aortic stenosis is present.   7. Aortic dilatation noted. There is dilatation of the aortic root,  measuring 45 mm. There is dilatation of the ascending aorta, measuring 40  mm.   8. The inferior vena cava is dilated in size with >50% respiratory  variability, suggesting right atrial pressure of 8 mmHg.   Assessment:    1. Persistent atrial fibrillation ([place])   2. Medication management   3. Aortic root dilatation (HCC)   4. Essential hypertension   5. Type 2 diabetes mellitus without complication, with long-term current use of insulin ([place])      Plan:   In order of problems listed above:  1. Persistent Atrial Fibrillation - He underwent successful TEE/DCCV on 03/05/2022 with conversion to NSR with a 200 J biphasic shock.  His heart rate has been well controlled when checked at home and he is in normal sinus rhythm by examination and EKG today. He has been taking Lopressor 50 mg 3 times daily and will reduce this to 50 mg twice daily given his heart rate in the 50's at times. He did have issues with fluid buildup while admitted and given that he is on HCTZ and Lasix, will have him only take Lasix as needed given no recent fluid issues since return to NSR. - No reports of active bleeding. He remains on Eliquis 5  mg twice daily for anticoagulation. Recheck CBC and BMET today.   2. Aortic Root Dilatation - Recent echo showed dilatation of the aortic root measuring 45 mm and dilatation of the ascending aorta at 40 mm. Would anticipate repeat imaging next year.  3. HTN - His blood  pressure is soft at 102/60 during today's visit and he denies any symptoms at this current time but does report occasional dizziness with positional changes. He has been taking both Lasix and HCTZ, therefore will stop daily Lasix and only have him take this as needed for weight gain or worsening edema.  Will also reduce Lopressor as outlined above. Continue HCTZ 25 mg daily, Spironolactone 12.5 mg daily and Telmisartan 80 mg daily. Will recheck a BMET today.   4. Type 2 DM - Hgb A1c was elevated to 11.0 during his recent admission. He has been taking Metformin along with Insulin. He does have follow-up with his PCP later this month.     Medication Adjustments/Labs and Tests Ordered: Current medicines are reviewed at length with the patient today.  Concerns regarding medicines are outlined above.  Medication changes, Labs and Tests ordered today are listed in the Patient Instructions below. Patient Instructions  Medication Instructions:   Decrease Lasix to as needed for fluid  Decrease Metoprolol to 50 mg Two Times Daily   *If you need a refill on your cardiac medications before your next appointment, please call your pharmacy*   Lab Work: Your physician recommends that you return for lab work in: Today ( [place], BMET)   If you have labs (blood work) drawn today and your tests are completely normal, you will receive your results only by: [place] (if you have [place]) OR A paper copy in the mail If you have any lab test that is abnormal or we need to change your treatment, we will call you to review the results.   Testing/Procedures: NONE    Follow-Up: At [facility], you and your health needs are our  priority.  As part of our continuing mission to provide you with exceptional heart care, we have created designated Provider Care Teams.  These Care Teams include your primary Cardiologist (physician) and Advanced Practice Providers (APPs -  Physician Assistants and Nurse Practitioners) who all work together to provide you with the care you need, when you need it.  We recommend signing up for the patient portal called "MyChart".  Sign up information is provided on this After Visit Summary.  MyChart is used to connect with patients for Virtual Visits (Telemedicine).  Patients are able to view lab/test results, encounter notes, upcoming appointments, etc.  Non-urgent messages can be sent to your provider as well.   To learn more about what you can do with MyChart, go to [email or web address].    Your next appointment:   3 month(s)  The format for your next appointment:   In Person  Provider:   You may see Jenkins Rouge, MD or one of the following Advanced Practice Providers on your designated Care Team:   Bernerd Pho, PA-C  Ermalinda Barrios, PA-C     Other Instructions Thank you for choosing [facility]!    Important Information About [place]         Signed, Erma Heritage, PA-C  03/23/2022 4:50 PM    [facility] [address] [place] , [place] [address] Phone: [phone] Fax: [phone]

## 2022-06-04 NOTE — Progress Notes (Signed)
Cardiology Office Note    Date:  06/14/2022   ID:  Kenneth Bridges, DOB [birth year], MRN [id number]  PCP:  [facility]  Cardiologist: Jenkins Rouge, MD    No chief complaint on file.   History of Present Illness:    Kenneth Bridges is a 56 y.o. male with past medical history of HTN, Type II DM and OSA who presents to the office today for hospital follow-up.  He was admitted to [facility] 03/03/22  for new-onset atrial fibrillation with RVR and was initially started on IV Cardizem. Echocardiogram did show his EF was mildly reduced at 50 to 55% with apical hypokinesis and mildly dilated left atria. Was also noted to have dilatation of the aortic root measuring 45 mm and dilatation of the ascending aorta at 40 mm. He ultimately required TEE/DCCV which was performed on 03/05/2022 with conversion to NSR with a 200 J biphasic shock. Seen by PA on d/c 03/23/22 and beta blocker d/c diuretics lowered   He has been using his CPAP at night. Also reduced his alcohol use. No  Missed doses of eliquis CHADVASC 3  He is on Ozempic now with better BS control Hopefully will help him lose weight BP [facility] at [facility] Just got BP cuff to monitor at home. Discussed needing to  Start another med like Norvasc if home readings high I go 140/85 mmHg on repeat  Also let him know that better diet and weight loss would help avoid adding another medication to control    Past Medical History:  Diagnosis Date   Atrial fibrillation ([place])    a. 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)   Sleep apnea    Wears glasses     Past Surgical History:  Procedure Laterality Date   ANKLE FRACTURE SURGERY Left 1988   CARDIOVERSION N/A 03/05/2022   Procedure: CARDIOVERSION;  Surgeon: Werner Lean, MD;  Location: [facility] ORS;  Service: Cardiovascular;  Laterality: N/A;   COLONOSCOPY N/A  05/19/2016   Procedure: COLONOSCOPY;  Surgeon: Daneil Dolin, MD;  Location[facility];  Service: Endoscopy;  Laterality: N/A;  9:30 am   HERNIA REPAIR Left    inguinal   KNEE ARTHROSCOPY WITH MEDIAL MENISECTOMY Right 08/28/2019   Procedure: KNEE ARTHROSCOPY WITH MEDIAL MENISCECTOMY;  Surgeon: Carole Civil, MD;  Location: [facility];  Service: Orthopedics;  Laterality: Right;   NASAL SEPTOPLASTY W/ TURBINOPLASTY  02/13/2016   NASAL SEPTOPLASTY WITH TURBINATE REDUCTION (Bilateral)   NASAL SEPTOPLASTY W/ TURBINOPLASTY Bilateral 02/13/2016   Procedure: NASAL SEPTOPLASTY WITH TURBINATE REDUCTION;  Surgeon: Melida Quitter, MD;  Location: [place];  Service: ENT;  Laterality: Bilateral;   SYNDESMOSIS REPAIR Left 11/07/2020   Procedure: SYNDESMOSIS REPAIR AND MEDIAL DELTOID REPAIR LEFT ANKLE;  Surgeon: Carole Civil, MD;  Location: [facility];  Service: Orthopedics;  Laterality: Left;   TEE WITHOUT CARDIOVERSION N/A 03/05/2022   Procedure: TRANSESOPHAGEAL ECHOCARDIOGRAM (TEE);  Surgeon: Werner Lean, MD;  Location: [facility];  Service: Cardiovascular;  Laterality: N/A;    Current Medications: Outpatient Medications Prior to Visit  Medication Sig Dispense Refill   apixaban (ELIQUIS) 5 MG TABS tablet Take 1 tablet (5 mg total) by mouth 2 (two) times daily. 120 tablet 0   blood glucose meter kit and supplies KIT Dispense based on patient and insurance preference. Use up to four times  daily as directed. 1 each 0   hydrochlorothiazide (HYDRODIURIL) 25 MG tablet Take 25 mg by mouth daily.     insulin glargine-yfgn (SEMGLEE) 100 UNIT/ML Pen INJECT 24 UNITS SUBCUTANEOUSLY EVERY MORNING (USE WITHIN 28 DAYS AFTER OPENING PEN)     insulin lispro (HUMALOG) 100 UNIT/ML cartridge Inject 0.04 mLs (4 Units total) into the skin 3 (three) times daily with meals. 15 mL 11   Insulin Syringe-Needle U-100 (INSULIN SYRINGE .5CC/31GX5/16") 31G X 5/16" 0.5 ML MISC Take as directed 100 each 3   metFORMIN  (GLUCOPHAGE-XR) 750 MG 24 hr tablet Take 750 mg by mouth daily with breakfast.     metoprolol tartrate (LOPRESSOR) 50 MG tablet Take 1 tablet (50 mg total) by mouth 2 (two) times daily. 180 tablet 1   Semaglutide,0.25 or 0.5MG/DOS, (OZEMPIC, 0.25 OR 0.5 MG/DOSE,) 2 MG/1.5ML SOPN Inject into the skin.     sildenafil (VIAGRA) 100 MG tablet Take by mouth.     spironolactone (ALDACTONE) 25 MG tablet Take 0.5 tablets (12.5 mg total) by mouth daily. (Patient taking differently: Take 12.5 mg by mouth 2 (two) times daily.) 15 tablet 0   telmisartan (MICARDIS) 80 MG tablet Take 80 mg by mouth daily.     furosemide (LASIX) 40 MG tablet Take 1 tablet (40 mg total) by mouth daily as needed for fluid. (Patient not taking: Reported on 06/14/2022) 30 tablet 1   insulin glargine (LANTUS) 100 UNIT/ML injection Inject 0.1 mLs (10 Units total) into the skin daily. 10 mL 3   metFORMIN (GLUCOPHAGE) 500 MG tablet Take 500 mg by mouth 2 (two) times daily with a meal.     No facility-administered medications prior to visit.     Allergies:   Patient has no known allergies.   Social History   Socioeconomic History   Marital status: Married    Spouse name: Not on file   Number of children: Not on file   Years of education: Not on file   Highest education level: Not on file  Occupational History   Not on file  Tobacco Use   Smoking status: Never   Smokeless tobacco: Never  Vaping Use   Vaping Use: Never used  Substance and Sexual Activity   Alcohol use: Not Currently    Comment: social beer drinker on the weekends (drinks 2 beers on Friday and 2 beers on Saturday)    Drug use: No   Sexual activity: Not on file  Other Topics Concern   Not on file  Social History Narrative   Not on file   Social Determinants of Health   Financial Resource Strain: Not on file  Food Insecurity: Not on file  Transportation Needs: Not on file  Physical Activity: Not on file  Stress: Not on file  Social Connections: Not  on file     Family History:  The patient's family history includes Cancer in his mother.   Review of Systems:    Please see the history of present illness.     All other systems reviewed and are otherwise negative except as noted above.   Physical Exam:    VS:  BP (!) 140/92   Pulse 72   Ht 5' 10" (1.778 m)   Wt (!) 309 lb 3.2 oz (140.3 kg)   SpO2 94%   BMI 44.37 kg/m    General: Well developed, well nourished,male appearing in no acute distress. Head: Normocephalic, atraumatic. Neck: No carotid bruits. JVD not elevated.  Lungs: Respirations regular and unlabored,  without wheezes or rales.  Heart: Regular rate and rhythm. No S3 or S4.  No murmur, no rubs, or gallops appreciated. Abdomen: Appears non-distended. No obvious abdominal masses. Msk:  Strength and tone appear normal for age. No obvious joint deformities or effusions. Extremities: No clubbing or cyanosis. No pitting edema.  Distal pedal pulses are 2+ bilaterally. Neuro: Alert and oriented X 3. Moves all extremities spontaneously. No focal deficits noted. Psych:  Responds to questions appropriately with a normal affect. Skin: No rashes or lesions noted  Wt Readings from Last 3 Encounters:  06/14/22 (!) 309 lb 3.2 oz (140.3 kg)  03/23/22 295 lb 9.6 oz (134.1 kg)  03/05/22 (!) 301 lb 2.4 oz (136.6 kg)     Studies/Labs Reviewed:   EKG:  EKG is ordered today.  The ekg ordered today demonstrates NSR, HR 69 with RAD and incomplete RBBB.   Recent Labs: 03/04/2022: TSH 1.213 03/05/2022: Magnesium 2.1 03/23/2022: BUN 23; Creatinine, Ser 1.04; Hemoglobin 16.0; Platelets 200; Potassium 4.0; Sodium 135   Lipid Panel No results found for: "CHOL", "TRIG", "HDL", "CHOLHDL", "VLDL", "LDLCALC", "LDLDIRECT"  Additional studies/ records that were reviewed today include:   Echocardiogram: 03/04/2022 IMPRESSIONS     1. Left ventricular ejection fraction, by estimation, is 50 to 55%. The  left ventricle has low normal  function. The left ventricle demonstrates  regional wall motion abnormalities. Apical hypokinesis. There is mild left  ventricular hypertrophy. Left  ventricular diastolic parameters are indeterminate.   2. Right ventricular systolic function is normal. The right ventricular  size is normal.   3. Left atrial size was mildly dilated.   4. Right atrial size was mildly dilated.   5. The mitral valve is normal in structure. Trivial mitral valve  regurgitation. No evidence of mitral stenosis.   6. The aortic valve was not well visualized. Aortic valve regurgitation  is not visualized. No aortic stenosis is present.   7. Aortic dilatation noted. There is dilatation of the aortic root,  measuring 45 mm. There is dilatation of the ascending aorta, measuring 40  mm.   8. The inferior vena cava is dilated in size with >50% respiratory  variability, suggesting right atrial pressure of 8 mmHg.    Plan:   In order of problems listed above:  1. Persistent Atrial Fibrillation - He underwent successful TEE/DCCV on 03/05/2022 with conversion to NSR with a 200 J biphasic shock.  Now on bid lopressor and diuretic stopped as he is more euvolemic in NSR Continue eliquis CHAD VASC 3 for low EF, HTN and aortic plaque on TEE   2. Aortic Root Dilatation - Recent echo showed dilatation of the aortic root measuring 45 mm and dilatation of the ascending aorta at 40 mm. TEE noted 4.2 cm 03/05/22  Will order gated chest CTA to further evaluate   3. HTN - [facility] will [facility] and report back to them Discussed importance of good control for aneurysm and PAF. Discussed weight loss and DASH diet Would start Norvasc if stays elevated in addition to his Micradis and diuretics    4. Type 2 DM - Hgb A1c was elevated to 11.0 during his recent admission. He has been taking Metformin along with Insulin. No on Ozempic as well   BMET Hb/Hct  CTA for aorta  F/U in a year   Medication Adjustments/Labs and  Tests Ordered: Current medicines are reviewed at length with the patient today.  Concerns regarding medicines are outlined above.  Medication changes,   Labs and Tests ordered today are listed in the Patient Instructions below. Patient Instructions  Medication Instructions:  Your physician recommends that you continue on your current medications as directed. Please refer to the Current Medication list given to you today.   Labwork: CBC,BMET today  Testing/Procedures: Schedule Gaited Chest CT at [facility]  Follow-Up: 6 months with Dr.Gardner Servantes  Any Other Special Instructions Will Be Listed Below (If Applicable).  If you need a refill on your cardiac medications before your next appointment, please call your pharmacy.    Signed, Jenkins Rouge, MD  06/14/2022 3:01 PM    [facility]. [address] [place],  [address] Phone: [phone] Fax: [phone]

## 2022-06-14 ENCOUNTER — Encounter: Payer: Self-pay | Admitting: Cardiovascular Disease

## 2022-06-14 ENCOUNTER — Other Ambulatory Visit (HOSPITAL_COMMUNITY)
Admission: RE | Admit: 2022-06-14 | Discharge: 2022-06-14 | Disposition: A | Discharge status: Home / Self Care | Payer: No Typology Code available for payment source | Source: Ambulatory Visit | Attending: Cardiovascular Disease | Admitting: Cardiovascular Disease

## 2022-06-14 ENCOUNTER — Ambulatory Visit
Payer: No Typology Code available for payment source | Attending: Cardiovascular Disease | Admitting: Cardiovascular Disease

## 2022-06-14 VITALS — BP 140/92 | HR 72 | Ht 70.0 in | Wt 309.2 lb

## 2022-06-14 DIAGNOSIS — I10 Essential (primary) hypertension: Secondary | ICD-10-CM | POA: Diagnosis not present

## 2022-06-14 DIAGNOSIS — I7121 Aneurysm of the ascending aorta, without rupture: Secondary | ICD-10-CM | POA: Diagnosis present

## 2022-06-14 DIAGNOSIS — I4819 Other persistent atrial fibrillation: Secondary | ICD-10-CM

## 2022-06-14 LAB — CBC
HCT: 41.1 % (ref 39.0–52.0)
Hemoglobin: 14.5 g/dL (ref 13.0–17.0)
MCH: 31.4 pg (ref 26.0–34.0)
MCHC: 35.3 g/dL (ref 30.0–36.0)
MCV: 89 fL (ref 80.0–100.0)
Platelets: 221 10*3/uL (ref 150–400)
RBC: 4.62 MIL/uL (ref 4.22–5.81)
RDW: 12.9 % (ref 11.5–15.5)
WBC: 7.6 10*3/uL (ref 4.0–10.5)
nRBC: 0 % (ref 0.0–0.2)

## 2022-06-14 LAB — BASIC METABOLIC PANEL
Anion gap: 7 (ref 5–15)
BUN: 13 mg/dL (ref 6–20)
CO2: 29 mmol/L (ref 22–32)
Calcium: 8.9 mg/dL (ref 8.9–10.3)
Chloride: 100 mmol/L (ref 98–111)
Creatinine, Ser: 1.16 mg/dL (ref 0.61–1.24)
GFR, Estimated: >60 mL/min (ref >60)
Glucose, Bld: 171 mg/dL — ABNORMAL HIGH (ref 70–99)
Potassium: 4.1 mmol/L (ref 3.5–5.1)
Sodium: 136 mmol/L (ref 135–145)

## 2022-06-14 NOTE — Patient Instructions (Addendum)
Medication Instructions:  Your physician recommends that you continue on your current medications as directed. Please refer to the Current Medication list given to you today.   Labwork: CBC,BMET today  Testing/Procedures: Schedule Gaited Chest CT at [facility]  Follow-Up: 6 months with Dr.Nishan  Any Other Special Instructions Will Be Listed Below (If Applicable).  If you need a refill on your cardiac medications before your next appointment, please call your pharmacy.

## 2022-06-23 ENCOUNTER — Ambulatory Visit (HOSPITAL_COMMUNITY)
Admission: RE | Admit: 2022-06-23 | Discharge: 2022-06-23 | Disposition: A | Discharge status: Home / Self Care | Payer: No Typology Code available for payment source | Source: Ambulatory Visit | Attending: Cardiovascular Disease | Admitting: Cardiovascular Disease

## 2022-06-23 DIAGNOSIS — I7121 Aneurysm of the ascending aorta, without rupture: Secondary | ICD-10-CM | POA: Insufficient documentation

## 2022-06-23 MED ORDER — IOHEXOL 350 MG/ML SOLN
80.0000 mL | Freq: Once | INTRAVENOUS | Status: AC | PRN
  Administered 2022-06-23: 80 mL via INTRAVENOUS

## 2022-07-20 ENCOUNTER — Telehealth: Payer: Self-pay | Admitting: Cardiovascular Disease

## 2022-07-20 NOTE — Telephone Encounter (Signed)
[facility] called and wanted to know if patient's notes could be faxed over to them. The number is [phone].

## 2022-10-01 ENCOUNTER — Encounter: Payer: Self-pay | Admitting: Cardiovascular Disease

## 2022-10-04 ENCOUNTER — Other Ambulatory Visit: Payer: Self-pay

## 2022-10-04 DIAGNOSIS — I4819 Other persistent atrial fibrillation: Secondary | ICD-10-CM

## 2022-10-07 ENCOUNTER — Encounter: Payer: Self-pay | Admitting: Radiology

## 2022-11-03 ENCOUNTER — Other Ambulatory Visit (HOSPITAL_COMMUNITY)
Admission: RE | Admit: 2022-11-03 | Discharge: 2022-11-03 | Disposition: A | Discharge status: Home / Self Care | Payer: No Typology Code available for payment source | Source: Ambulatory Visit | Attending: Cardiovascular Disease | Admitting: Cardiovascular Disease

## 2022-11-03 ENCOUNTER — Ambulatory Visit (HOSPITAL_COMMUNITY)
Admission: RE | Admit: 2022-11-03 | Discharge: 2022-11-03 | Disposition: A | Discharge status: Home / Self Care | Payer: No Typology Code available for payment source | Source: Ambulatory Visit | Attending: Cardiovascular Disease | Admitting: Cardiovascular Disease

## 2022-11-03 DIAGNOSIS — I4819 Other persistent atrial fibrillation: Secondary | ICD-10-CM | POA: Insufficient documentation

## 2022-11-03 LAB — ECHOCARDIOGRAM COMPLETE
Area-P 1/2: 3.17 cm2
S' Lateral: 3.6 cm

## 2022-11-03 LAB — BASIC METABOLIC PANEL
Anion gap: 8 (ref 5–15)
BUN: 17 mg/dL (ref 6–20)
CO2: 26 mmol/L (ref 22–32)
Calcium: 8.4 mg/dL — ABNORMAL LOW (ref 8.9–10.3)
Chloride: 100 mmol/L (ref 98–111)
Creatinine, Ser: 1.01 mg/dL (ref 0.61–1.24)
GFR, Estimated: >60 mL/min (ref >60)
Glucose, Bld: 223 mg/dL — ABNORMAL HIGH (ref 70–99)
Potassium: 4.1 mmol/L (ref 3.5–5.1)
Sodium: 134 mmol/L — ABNORMAL LOW (ref 135–145)

## 2022-11-03 LAB — BRAIN NATRIURETIC PEPTIDE: B Natriuretic Peptide: 40 pg/mL (ref 0.0–100.0)

## 2022-11-03 MED ORDER — PERFLUTREN LIPID MICROSPHERE
1.0000 mL | INTRAVENOUS | Status: AC | PRN
  Administered 2022-11-03: 5 mL via INTRAVENOUS

## 2022-11-03 NOTE — Progress Notes (Signed)
*  PRELIMINARY RESULTS* Echocardiogram 2D Echocardiogram has been performed with Definity.  Samuel Germany 11/03/2022, 11:49 AM

## 2022-12-14 NOTE — Progress Notes (Deleted)
Cardiology Office Note    Date:  12/14/2022   ID:  Kenneth Bridges, DOB 12-29-1965, MRN [id number]  PCP:  [facility]  Cardiologist: Charlton Haws, MD     History of Present Illness:    Kenneth Bridges is a 57 y.o. male with past medical history of HTN, Type II DM and OSA who presents to the office today for hospital follow-up.  He was admitted to [facility] 03/03/22  for new-onset atrial fibrillation with RVR and was initially started on IV Cardizem. Echocardiogram did show his EF was mildly reduced at 50 to 55% with apical hypokinesis and mildly dilated left atria. Was also noted to have dilatation of the aortic root measuring 45 mm and dilatation of the ascending aorta at 40 mm. He ultimately required TEE/DCCV which was performed on 03/05/2022 with conversion to NSR with a 200 J biphasic shock. Seen by PA on d/c 03/23/22 and beta blocker d/c diuretics lowered   He has been using his CPAP at night. Also reduced his alcohol use. No  Missed doses of eliquis CHADVASC 3  CTA Chest 06/24/22 with ascending aorta 4.3 cm  TTE 11/03/22 EF normal  60-65% trivial MR LA 47 mm Ao 4.3 cm   ***   Past Medical History:  Diagnosis Date   Atrial fibrillation (HCC)    a. 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)   Sleep apnea    Wears glasses     Past Surgical History:  Procedure Laterality Date   ANKLE FRACTURE SURGERY Left 1988   CARDIOVERSION N/A 03/05/2022   Procedure: CARDIOVERSION;  Surgeon: Christell Constant, MD;  Location: [facility];  Service: Cardiovascular;  Laterality: N/A;   COLONOSCOPY N/A 05/19/2016   Procedure: COLONOSCOPY;  Surgeon: Corbin Ade, MD;  Location: [facility];  Service: Endoscopy;  Laterality: N/A;  9:30 am   HERNIA REPAIR Left    inguinal   KNEE ARTHROSCOPY WITH MEDIAL MENISECTOMY Right 08/28/2019   Procedure: KNEE ARTHROSCOPY  WITH MEDIAL MENISCECTOMY;  Surgeon: Vickki Hearing, MD;  Location: [facility];  Service: Orthopedics;  Laterality: Right;   NASAL SEPTOPLASTY W/ TURBINOPLASTY  02/13/2016   NASAL SEPTOPLASTY WITH TURBINATE REDUCTION (Bilateral)   NASAL SEPTOPLASTY W/ TURBINOPLASTY Bilateral 02/13/2016   Procedure: NASAL SEPTOPLASTY WITH TURBINATE REDUCTION;  Surgeon: Christia Reading, MD;  Location: [facility] OR;  Service: ENT;  Laterality: Bilateral;   SYNDESMOSIS REPAIR Left 11/07/2020   Procedure: SYNDESMOSIS REPAIR AND MEDIAL DELTOID REPAIR LEFT ANKLE;  Surgeon: Vickki Hearing, MD;  Location: [facility];  Service: Orthopedics;  Laterality: Left;   TEE WITHOUT CARDIOVERSION N/A 03/05/2022   Procedure: TRANSESOPHAGEAL ECHOCARDIOGRAM (TEE);  Surgeon: Christell Constant, MD;  Location: [facility];  Service: Cardiovascular;  Laterality: N/A;    Current Medications: Outpatient Medications Prior to Visit  Medication Sig Dispense Refill   apixaban (ELIQUIS) 5 MG TABS tablet Take 1 tablet (5 mg total) by mouth 2 (two) times daily. 120 tablet 0   blood glucose meter kit and supplies KIT Dispense based on patient and insurance preference. Use up to four times daily as directed. 1 each 0   furosemide (LASIX) 40 MG tablet Take 1 tablet (40 mg total) by mouth daily as needed for fluid. (Patient not taking: Reported on 06/14/2022) 30 tablet 1   hydrochlorothiazide (HYDRODIURIL) 25 MG tablet Take 25 mg by  mouth daily.     insulin glargine-yfgn (SEMGLEE) 100 UNIT/ML Pen INJECT 24 UNITS SUBCUTANEOUSLY EVERY MORNING (USE WITHIN 28 DAYS AFTER OPENING PEN)     insulin lispro (HUMALOG) 100 UNIT/ML cartridge Inject 0.04 mLs (4 Units total) into the skin 3 (three) times daily with meals. 15 mL 11   Insulin Syringe-Needle U-100 (INSULIN SYRINGE .5CC/31GX5/16") 31G X 5/16" 0.5 ML MISC Take as directed 100 each 3   metFORMIN (GLUCOPHAGE-XR) 750 MG 24 hr tablet Take 750 mg by mouth daily with breakfast.     metoprolol tartrate (LOPRESSOR) 50 MG  tablet Take 1 tablet (50 mg total) by mouth 2 (two) times daily. 180 tablet 1   Semaglutide,0.25 or 0.5MG /DOS, (OZEMPIC, 0.25 OR 0.5 MG/DOSE,) 2 MG/1.5ML SOPN Inject into the skin.     sildenafil (VIAGRA) 100 MG tablet Take by mouth.     spironolactone (ALDACTONE) 25 MG tablet Take 0.5 tablets (12.5 mg total) by mouth daily. (Patient taking differently: Take 12.5 mg by mouth 2 (two) times daily.) 15 tablet 0   telmisartan (MICARDIS) 80 MG tablet Take 80 mg by mouth daily.     No facility-administered medications prior to visit.     Allergies:   Patient has no known allergies.   Social History   Socioeconomic History   Marital status: Married    Spouse name: Not on file   Number of children: Not on file   Years of education: Not on file   Highest education level: Not on file  Occupational History   Not on file  Tobacco Use   Smoking status: Never   Smokeless tobacco: Never  Vaping Use   Vaping Use: Never used  Substance and Sexual Activity   Alcohol use: Not Currently    Comment: social beer drinker on the weekends (drinks 2 beers on Friday and 2 beers on Saturday)    Drug use: No   Sexual activity: Not on file  Other Topics Concern   Not on file  Social History Narrative   Not on file   Social Determinants of Health   Financial Resource Strain: Not on file  Food Insecurity: Not on file  Transportation Needs: Not on file  Physical Activity: Not on file  Stress: Not on file  Social Connections: Not on file     Family History:  The patient's family history includes Cancer in his mother.   Review of Systems:    Please see the history of present illness.     All other systems reviewed and are otherwise negative except as noted above.   Physical Exam:    VS:  There were no vitals taken for this visit.   General: Well developed, well nourished,male appearing in no acute distress. Head: Normocephalic, atraumatic. Neck: No carotid bruits. JVD not elevated.  Lungs:  Respirations regular and unlabored, without wheezes or rales.  Heart: Regular rate and rhythm. No S3 or S4.  No murmur, no rubs, or gallops appreciated. Abdomen: Appears non-distended. No obvious abdominal masses. Msk:  Strength and tone appear normal for age. No obvious joint deformities or effusions. Extremities: No clubbing or cyanosis. No pitting edema.  Distal pedal pulses are 2+ bilaterally. Neuro: Alert and oriented X 3. Moves all extremities spontaneously. No focal deficits noted. Psych:  Responds to questions appropriately with a normal affect. Skin: No rashes or lesions noted  Wt Readings from Last 3 Encounters:  06/14/22 (!) 309 lb 3.2 oz (140.3 kg)  03/23/22 295 lb 9.6 oz (134.1 kg)  03/05/22 (!) 301 lb 2.4 oz (136.6 kg)     Studies/Labs Reviewed:   EKG:  EKG is ordered today.  The ekg ordered today demonstrates NSR, HR 69 with RAD and incomplete RBBB.   Recent Labs: 03/04/2022: TSH 1.213 03/05/2022: Magnesium 2.1 06/14/2022: Hemoglobin 14.5; Platelets 221 11/03/2022: B Natriuretic Peptide 40.0; BUN 17; Creatinine, Ser 1.01; Potassium 4.1; Sodium 134   Lipid Panel No results found for: "CHOL", "TRIG", "HDL", "CHOLHDL", "VLDL", "LDLCALC", "LDLDIRECT"  Additional studies/ records that were reviewed today include:   Echocardiogram: 03/04/2022 IMPRESSIONS     1. Left ventricular ejection fraction, by estimation, is 50 to 55%. The  left ventricle has low normal function. The left ventricle demonstrates  regional wall motion abnormalities. Apical hypokinesis. There is mild left  ventricular hypertrophy. Left  ventricular diastolic parameters are indeterminate.   2. Right ventricular systolic function is normal. The right ventricular  size is normal.   3. Left atrial size was mildly dilated.   4. Right atrial size was mildly dilated.   5. The mitral valve is normal in structure. Trivial mitral valve  regurgitation. No evidence of mitral stenosis.   6. The aortic valve  was not well visualized. Aortic valve regurgitation  is not visualized. No aortic stenosis is present.   7. Aortic dilatation noted. There is dilatation of the aortic root,  measuring 45 mm. There is dilatation of the ascending aorta, measuring 40  mm.   8. The inferior vena cava is dilated in size with >50% respiratory  variability, suggesting right atrial pressure of 8 mmHg.    Plan:   In order of problems listed above:  1. Persistent Atrial Fibrillation - He underwent successful TEE/DCCV on 03/05/2022 with conversion to NSR with a 200 J biphasic shock.  Now on bid lopressor and diuretic stopped as he is more euvolemic in NSR Continue eliquis [place] VASC 3 for low EF, HTN and aortic plaque on TEE   2. Aortic Root Dilatation -  Only 4.3 cm by CTA 06/2022 f/u imaging November 2025 BP well controlled   3. HTN - Has BP cuff from [place] will monitor on micardis and diuretic ***  4. Type 2 DM - Hgb A1c was elevated to 11.0 during his recent admission. He has been taking Metformin along with Insulin. No on Ozempic as well   ***  F/U in a year   Medication Adjustments/Labs and Tests Ordered: Current medicines are reviewed at length with the patient today.  Concerns regarding medicines are outlined above.  Medication changes, Labs and Tests ordered today are listed in the Patient Instructions below. There are no Patient Instructions on file for this visit.   Signed, Charlton Haws, MD  12/14/2022 9:00 AM    [facility] [address] [place], [place] [address] Phone: [phone] Fax: [phone]

## 2022-12-20 ENCOUNTER — Ambulatory Visit
Payer: No Typology Code available for payment source | Attending: Cardiovascular Disease | Admitting: Cardiovascular Disease

## 2022-12-23 ENCOUNTER — Encounter: Payer: Self-pay | Admitting: Cardiovascular Disease

## 2023-02-08 ENCOUNTER — Encounter: Payer: Self-pay | Admitting: Student

## 2023-02-08 ENCOUNTER — Ambulatory Visit: Payer: No Typology Code available for payment source | Attending: Student | Admitting: Student

## 2023-02-08 VITALS — BP 128/80 | HR 80 | Ht 71.0 in | Wt 314.6 lb

## 2023-02-08 DIAGNOSIS — Z01812 Encounter for preprocedural laboratory examination: Secondary | ICD-10-CM

## 2023-02-08 DIAGNOSIS — I4819 Other persistent atrial fibrillation: Secondary | ICD-10-CM | POA: Diagnosis not present

## 2023-02-08 DIAGNOSIS — I77810 Thoracic aortic ectasia: Secondary | ICD-10-CM

## 2023-02-08 DIAGNOSIS — I7121 Aneurysm of the ascending aorta, without rupture: Secondary | ICD-10-CM

## 2023-02-08 DIAGNOSIS — I10 Essential (primary) hypertension: Secondary | ICD-10-CM

## 2023-02-08 DIAGNOSIS — G4733 Obstructive sleep apnea (adult) (pediatric): Secondary | ICD-10-CM

## 2023-02-08 MED ORDER — RIVAROXABAN 20 MG PO TABS: 20.0000 mg | ORAL_TABLET | Freq: Every day | ORAL | 3 refills | Status: DC

## 2023-02-08 MED ORDER — METOPROLOL SUCCINATE ER 25 MG PO TB24: 25.0000 mg | ORAL_TABLET | Freq: Every day | ORAL | 3 refills | Status: DC

## 2023-02-08 NOTE — Progress Notes (Signed)
Cardiology Office Note    Date:  02/08/2023  ID:  Kenneth Bridges, DOB 08-Sep-1965, MRN [id number] Cardiologist: Charlton Haws, MD    History of Present Illness:    Kenneth Bridges is a 57 y.o. male with past medical history of persistent atrial fibrillation (s/p DCCV in 02/2022), aortic root dilatation, HTN, Type 2 DM and OSA who presents to the office today for 69-month follow-up.  He was examined by Dr. Eden Emms in 06/2022 and denied any recent chest pain or palpitations at that time. He was using his CPAP on a nightly basis and had reduced his alcohol use. He was continued on Eliquis for anticoagulation and Lopressor 50 mg twice daily for rate control. CTA of the aorta was recommended for further evaluation of his dilated aortic root. This was performed in 06/2022 and showed mild dilatation of the ascending thoracic aorta at 4.3 cm with the aortic root measured at 4.2 cm with annual imaging recommended. Was also noted to have coronary artery calcifications, dilated central pulmonary arteries and significant hepatic steatosis with potential early cirrhotic morphology of the liver.  In talking with the patient today, he reports having baseline dyspnea exertion and fatigue which has overall been stable for the past year. He questions if this is secondary to his medications as he feels like this progressed around the time his atrial fibrillation was diagnosed. 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 Denies any associated chest pain or palpitations. No specific orthopnea, PND or pitting edema. He has been unable to tolerate his CPAP and does have follow-up at the [facility] for further discussion of this. In reviewing his medications, he has only been taking Eliquis and Lopressor once daily as he frequently forgets his evening doses. He has reduced his alcohol consumption significantly and only consumes a six-pack of beer every 2 to 3 weeks.   Studies Reviewed:    EKG: EKG is ordered today and demonstrates:   EKG Interpretation Date/Time:  Tuesday February 08 2023 15:08:15 EDT Ventricular Rate:  81 PR Interval:  156 QRS Duration:  92 QT Interval:  378 QTC Calculation: 439 R Axis:   102  Text Interpretation: Normal sinus rhythm Rightward axis Incomplete right bundle branch block Confirmed by Randall An ([id number]) on 02/08/2023 4:54:36 PM         Risk Assessment/Calculations:    CHA2DS2-VASc Score = 3   This indicates a 3.2% annual risk of stroke. The patient's score is based upon: CHF History: 0 HTN History: 1 Diabetes History: 1 Stroke History: 0 Vascular Disease History: 1 Age Score: 0 Gender Score: 0   Physical Exam:   VS:  BP 128/80   Pulse 80   Ht 5\' 11"  (1.803 m)   Wt (!) 314 lb 9.6 oz (142.7 kg)   SpO2 94%   BMI 43.88 kg/m    Wt Readings from Last 3 Encounters:  02/08/23 (!) 314 lb 9.6 oz (142.7 kg)  06/14/22 (!) 309 lb 3.2 oz (140.3 kg)  03/23/22 295 lb 9.6 oz (134.1 kg)     GEN: Well nourished, well developed male appearing in no acute distress NECK: No JVD; No carotid bruits CARDIAC: RRR, no murmurs, rubs, gallops RESPIRATORY:  Clear to auscultation without rales, wheezing or rhonchi  ABDOMEN: Appears non-distended. No obvious abdominal masses. EXTREMITIES: No clubbing or cyanosis. No pitting edema.  Distal pedal pulses are 2+ bilaterally.   Assessment and Plan:   1. Persistent Atrial Fibrillation - He underwent  cardioversion in 02/2022 and denies any recent palpitations resembling his prior atrial fibrillation. He is maintaining normal sinus rhythm by examination and repeat EKG today. Given his fatigue and dyspnea on exertion since being started on beta-blocker therapy, will reduce beta-blocker dose and switch to Toprol-XL for sustained release. He has only been taking Lopressor 50 mg once daily, therefore we will switch to Toprol-XL 25 mg daily. If fatigue and dyspnea on exertion do not improve with this,  we did review the possibility of a Coronary CT given calcifications by prior imaging. - No reports of active bleeding. He is currently on Eliquis 5 mg twice daily but only takes this once daily. Reviewed options with the patient and he prefers to switch to Xarelto for once daily dosing. Therefore, will stop Eliquis and switch to Xarelto 20 mg daily which is the appropriate dose given his calculated CrCl. Will request most recent labs from the [place]. He is also scheduled for follow-up labs within the next month.  2. Dilation of Ascending Thoracic Aorta - CTA Aorta in 06/2022 and showed mild dilatation of the ascending thoracic aorta at 4.3 cm with the aortic root measured at 4.2 cm with annual imaging recommended. Will arrange for a follow-up study in 06/2023. - Prior imaging also mentioned hepatic steatosis with potential early cirrhotic morphology of the liver. He has significantly reduced his alcohol intake in the interim. If noted on repeat imaging, would consider referral to GI.   3. HTN - Blood pressure is well-controlled at 128/80 during today's visit. Continue HCTZ 25 mg daily, Spironolactone 12.5 mg daily and Telmisartan 80 mg daily. Will switch Lopressor to Toprol-XL as discussed above.  4. OSA - He has been intolerant to his CPAP and [facility]. I encouraged him to keep follow-up for this and we reviewed the risks of untreated sleep apnea.  Signed, Ellsworth Lennox, PA-C

## 2023-02-08 NOTE — Patient Instructions (Addendum)
Medication Instructions:   Stop Eliquis. Switch to Xarelto 20mg  once daily - call office if co-pay is more than you can afford - will be getting through [place].    Stop Lopressor. Switch to Toprol-XL 25mg  daily.    Labwork:  BMET - order given Please do just prior to CTA   Testing/Procedures:  CTA Aorta in 06/2023  Follow-Up:  6 months with Dr. Eden Emms  Any Other Special Instructions Will Be Listed Below (If Applicable).   If you need a refill on your cardiac medications before your next appointment, please call your pharmacy.

## 2023-02-17 ENCOUNTER — Encounter: Payer: Self-pay | Admitting: Student

## 2023-02-17 DIAGNOSIS — I77810 Thoracic aortic ectasia: Secondary | ICD-10-CM | POA: Insufficient documentation

## 2023-02-17 DIAGNOSIS — I4819 Other persistent atrial fibrillation: Secondary | ICD-10-CM | POA: Insufficient documentation

## 2023-03-11 ENCOUNTER — Ambulatory Visit: Payer: No Typology Code available for payment source | Admitting: Student

## 2023-03-16 IMAGING — MR MR ANKLE*L* W/O CM
5 series · 40 of 40 positions shown · non-contrast
Comparison: None.

CLINICAL DATA: Medial and lateral ankle pain.

EXAM:
MRI OF THE LEFT ANKLE WITHOUT CONTRAST
TECHNIQUE: Multiplanar, multisequence MR imaging of the ankle was performed. No
intravenous contrast was administered.

[Series 5: PD fat-sat · axial · left · 3.0mm · 0.47mm/px · z∈[-76,+46]mm · 10 of 36 slices shown]
[im 1/36]
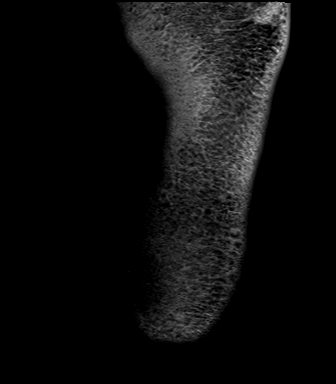
[im 4/36]
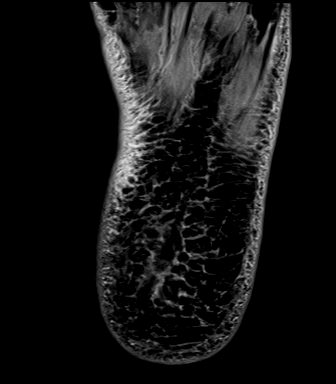
[im 8/36]
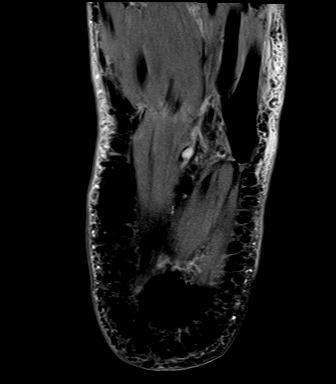
[im 12/36]
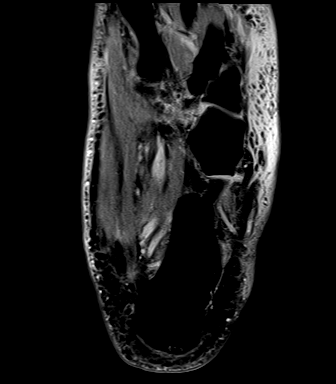
[im 16/36]
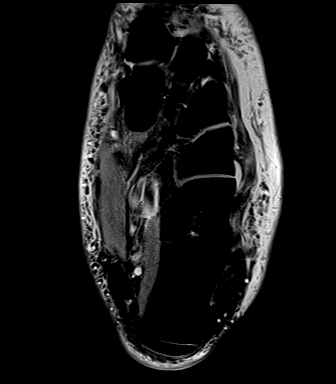
[im 20/36]
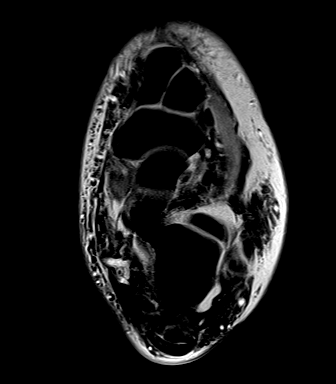
[im 24/36]
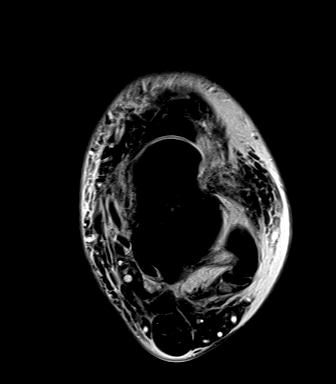
[im 28/36]
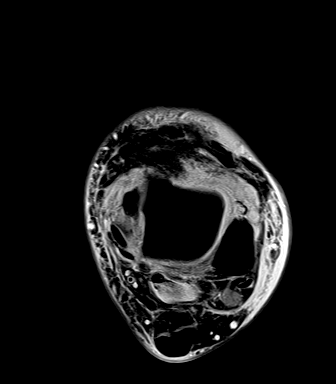
[im 32/36]
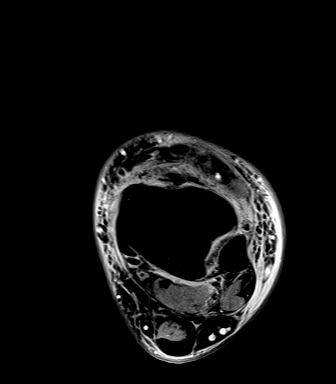
[im 36/36]
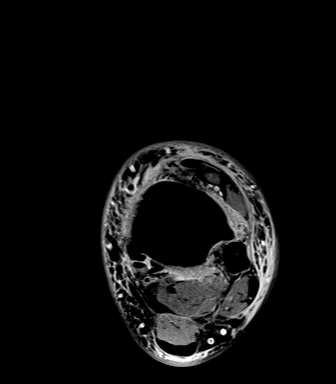

[Series 6: T2 fat-sat · coronal · left · 3.0mm · 0.50mm/px · 8 of 29 slices shown (1 of 2)]
[im 1/29]
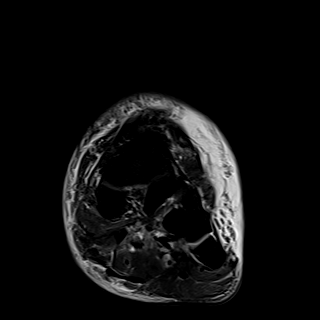
[im 5/29]
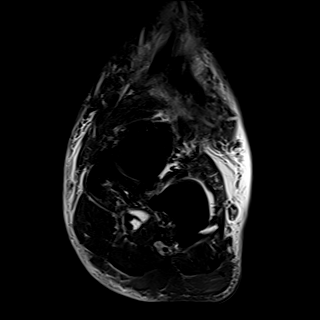
[im 9/29]
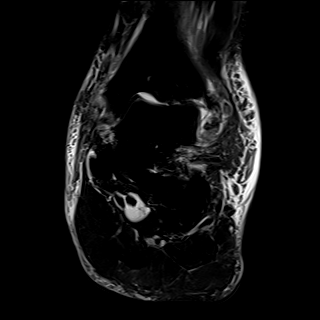
[im 13/29]
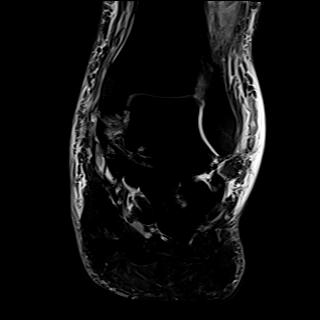
[im 17/29]
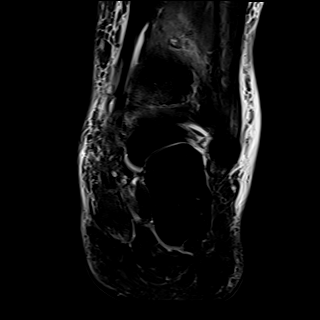
[im 21/29]
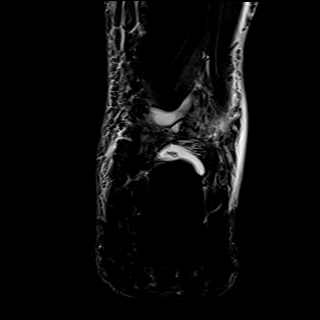
[im 25/29]
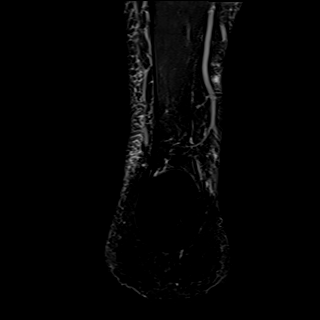
[im 29/29]
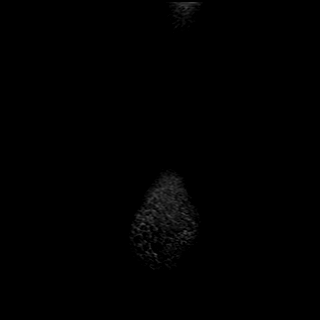

[Series 7: T1 · sagittal · left · 4.0mm · 0.70mm/px · 6 of 22 slices shown]
[im 1/22]
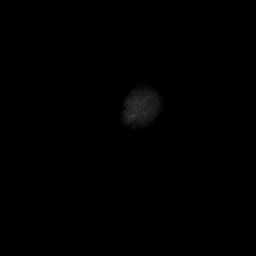
[im 5/22]
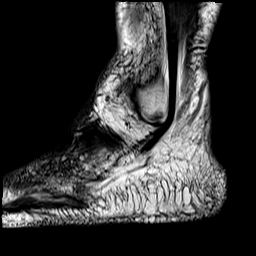
[im 9/22]
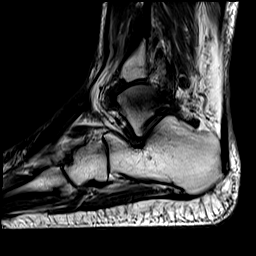
[im 13/22]
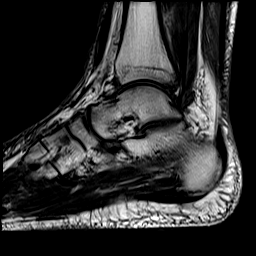
[im 17/22]
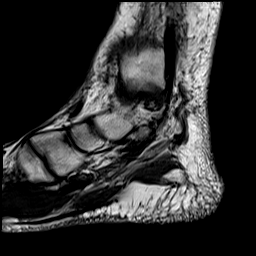
[im 22/22]
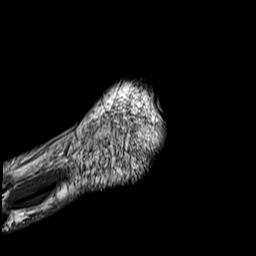

[Series 8: STIR · sagittal · left · 4.0mm · 0.70mm/px · 6 of 22 slices shown]
[im 1/22]
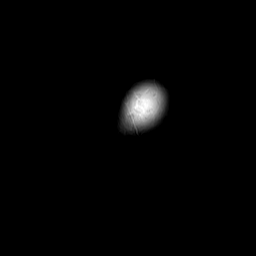
[im 5/22]
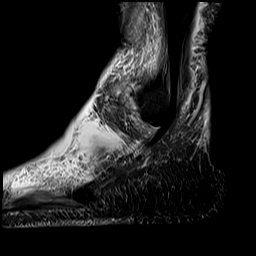
[im 9/22]
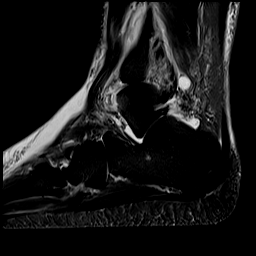
[im 13/22]
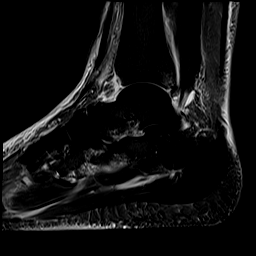
[im 17/22]
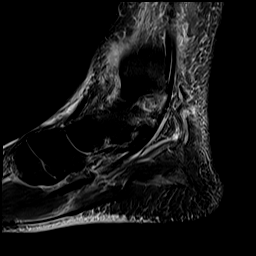
[im 22/22]
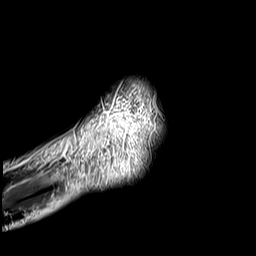

[Series 9: T2 fat-sat · axial · left · 3.0mm · 0.56mm/px · z∈[-76,+45]mm · 10 of 36 slices shown (2 of 2)]
[im 1/36]
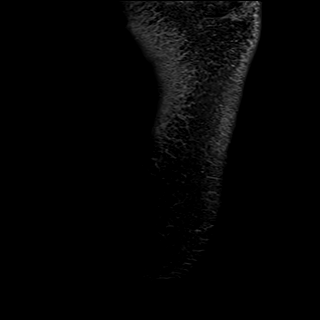
[im 4/36]
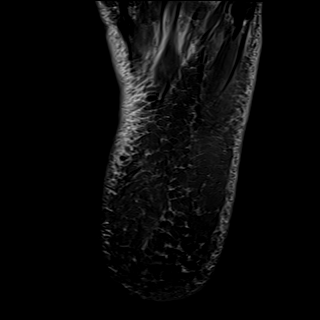
[im 8/36]
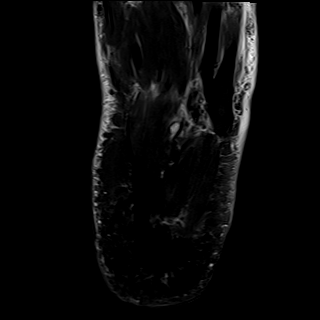
[im 12/36]
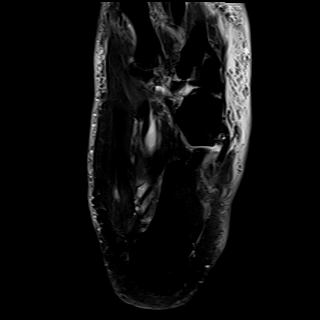
[im 16/36]
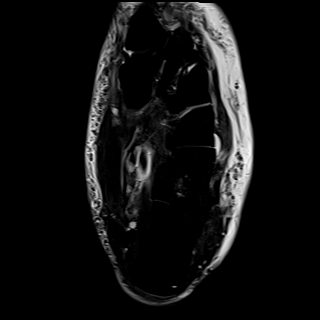
[im 20/36]
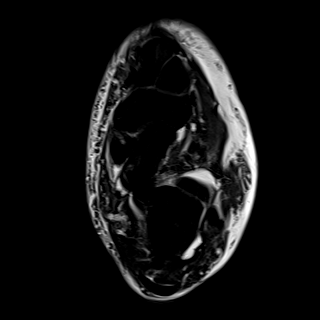
[im 24/36]
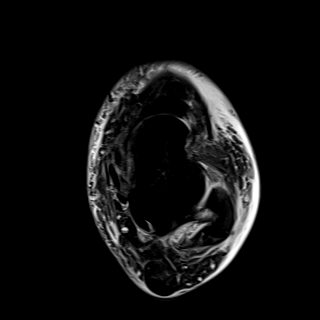
[im 28/36]
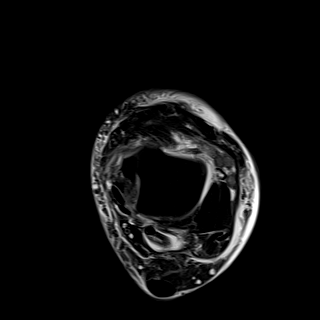
[im 32/36]
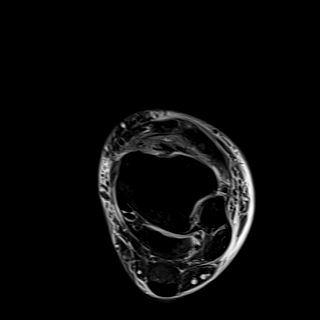
[im 36/36]
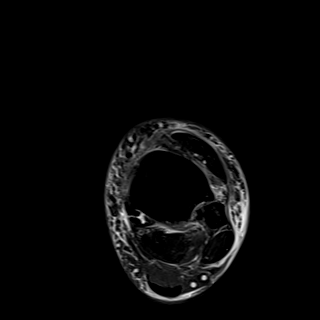

[40 of 40 positions shown; findings below may reference images not displayed]

FINDINGS: TENDONS

Peroneal: Peroneal longus tendon intact. Peroneal brevis intact.

Posteromedial: Posterior tibial tendon intact. Flexor hallucis
longus tendon intact. Flexor digitorum longus tendon intact. Small
amount of fluid in the flexor hallucis longus tendon sheath.

Anterior: Tibialis anterior tendon intact. Extensor hallucis longus
tendon intact Extensor digitorum longus tendon intact.

Achilles:  Intact.

Plantar Fascia: Intact.

LIGAMENTS

Lateral: Anterior talofibular ligament intact. Calcaneofibular
ligament intact. Posterior talofibular ligament intact. Complete
tear of the anterior tibiofibular ligament. Small amount hemorrhagic
fluid anterior to the tear. Intact posterior tibiofibular ligament.

Medial: Partial tear of the deltoid ligament. Severely attenuated
proximal aspect of the spring ligament concerning for a complete
tear.

CARTILAGE

Ankle Joint: Small ankle joint effusion. No chondral defect. Mild
marrow edema along the posterior corner of the tibial plafond and
posterior aspect of the medial malleolus.

Subtalar Joints/Sinus Tarsi: Normal subtalar joints. No subtalar
joint effusion. Normal sinus tarsi.

Bones: No other marrow signal abnormality. No fracture or
dislocation.

Soft Tissue: No fluid collection or hematoma. Muscles are normal
without edema or atrophy. Tarsal tunnel is normal. Severe soft
tissue edema along the lateral aspect of the ankle and foot and to
lesser extent along the medial aspect.
IMPRESSION: 1. Complete tear of the anterior tibiofibular ligament. Small amount
hemorrhagic fluid anterior to the tear.
2. Partial tear of the deltoid ligament. Severely attenuated
proximal aspect of the spring ligament concerning for a complete
tear.
3. Mild osseous contusion of the posterior corner of the tibial
plafond and posterior aspect of the medial malleolus.

## 2023-03-23 IMAGING — RF DG C-ARM 1-60 MIN
1 series · 7 of 7 positions shown · non-contrast
Comparison: October 27, 2020.

CLINICAL DATA: Syndesmosis repair and medial deltoid repair.

EXAM:
DG C-ARM 1-60 MIN; LEFT ANKLE - 2 VIEW
FLUOROSCOPY TIME:  Fluoroscopy Time:  1 minutes 28 seconds.
Number of Acquired Spot Images: 7

[Series 1: run · 7 of 7 slices shown]
[im 1/7]
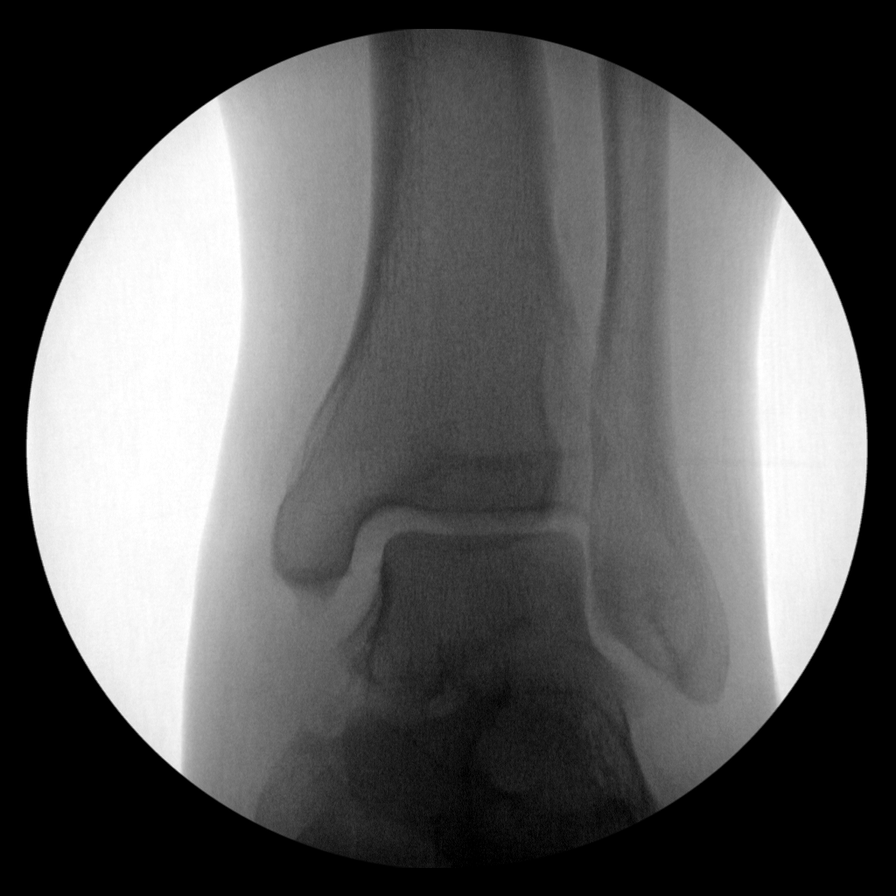
[im 2/7]
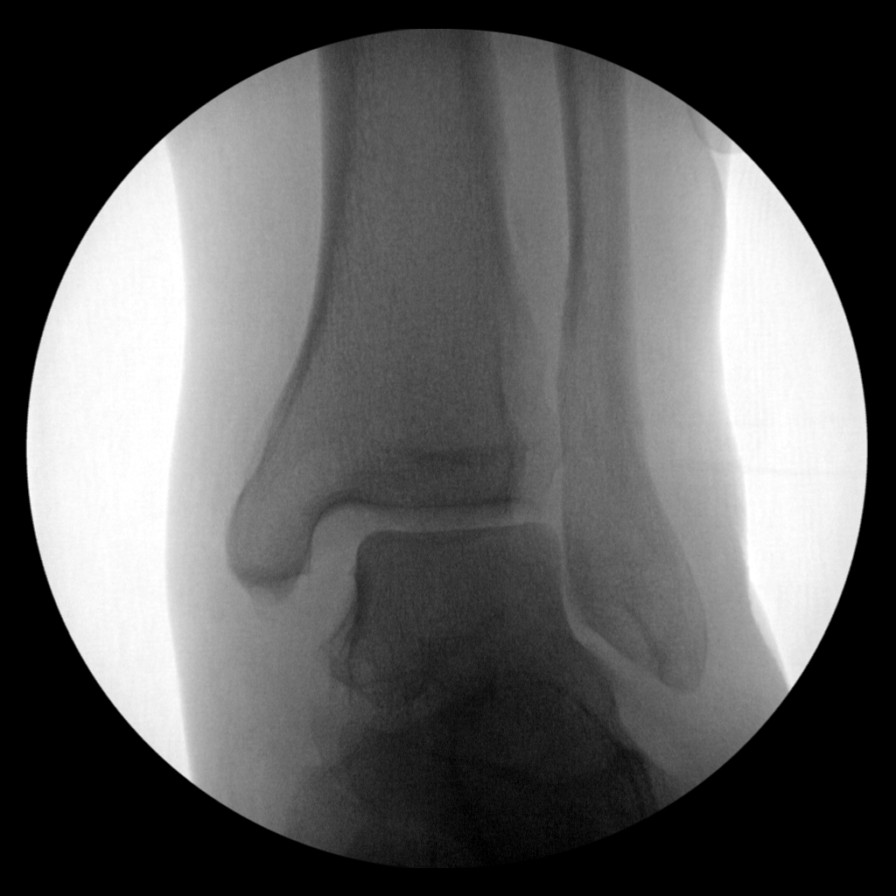
[im 3/7]
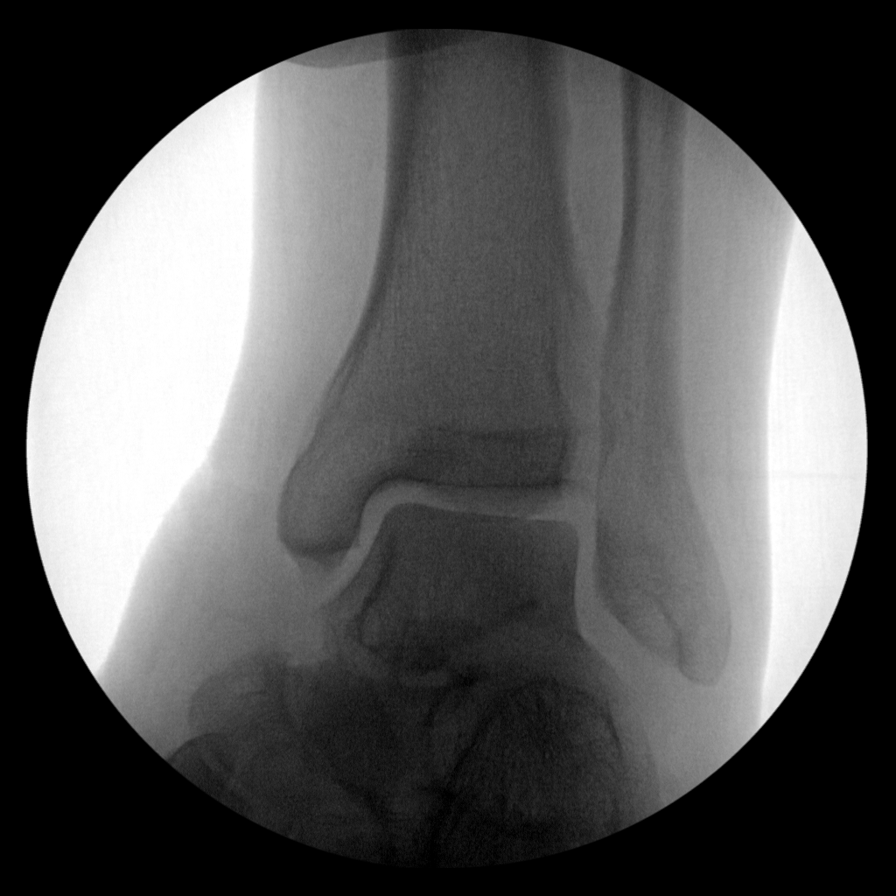
[im 4/7]
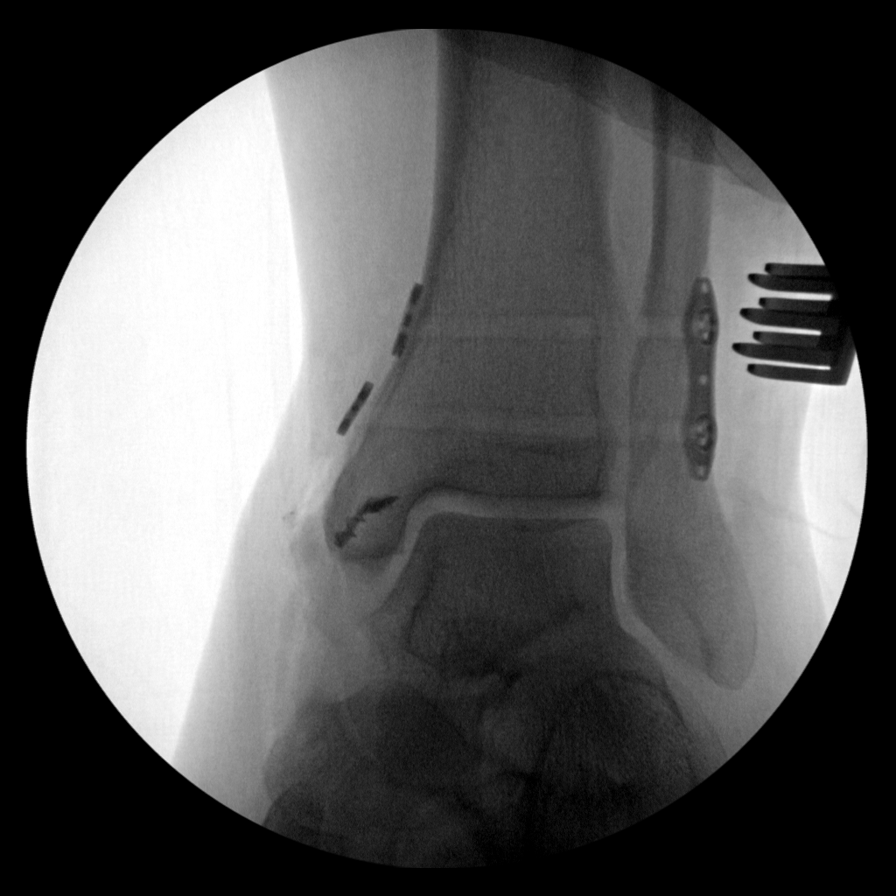
[im 5/7]
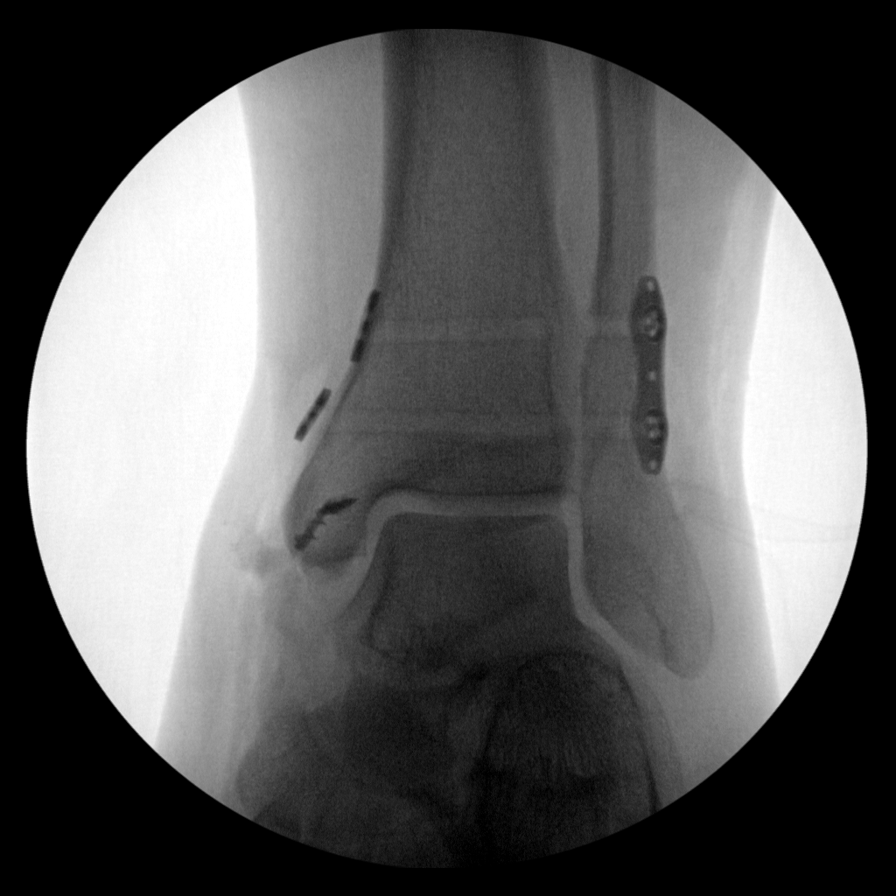
[im 6/7]
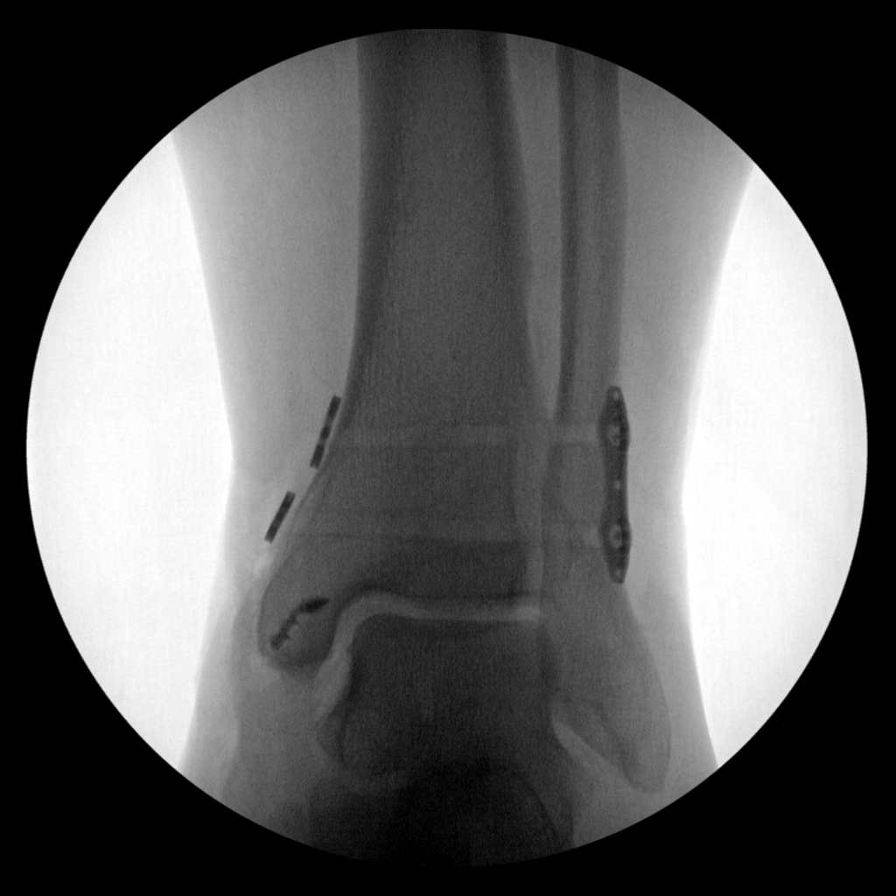
[im 7/7]
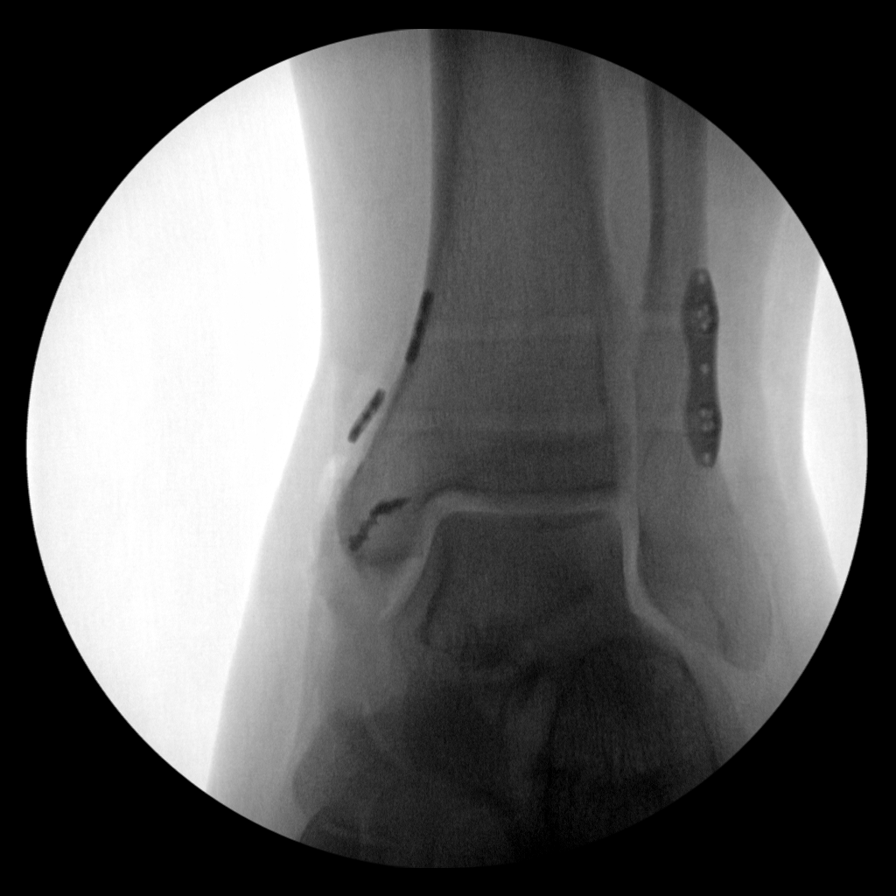

[7 of 7 positions shown; findings below may reference images not displayed]

FINDINGS: Seven C-arm fluoroscopic images were obtained intraoperatively and
submitted for post operative interpretation. These images
demonstrate repair of the tibiofibular syndesmosis and medial
deltoid with tight ropes/plates and suture anchors. Please see the
performing provider's procedural report for further detail.
IMPRESSION: Intraoperative fluoroscopy, as detailed above.

## 2023-06-10 ENCOUNTER — Other Ambulatory Visit (HOSPITAL_COMMUNITY)
Admission: RE | Admit: 2023-06-10 | Discharge: 2023-06-10 | Disposition: A | Discharge status: Home / Self Care | Payer: No Typology Code available for payment source | Source: Ambulatory Visit | Attending: Student | Admitting: Student

## 2023-06-10 DIAGNOSIS — I10 Essential (primary) hypertension: Secondary | ICD-10-CM | POA: Diagnosis present

## 2023-06-10 DIAGNOSIS — Z01812 Encounter for preprocedural laboratory examination: Secondary | ICD-10-CM | POA: Diagnosis present

## 2023-06-10 LAB — BASIC METABOLIC PANEL
Anion gap: 9 (ref 5–15)
BUN: 15 mg/dL (ref 6–20)
CO2: 28 mmol/L (ref 22–32)
Calcium: 8.9 mg/dL (ref 8.9–10.3)
Chloride: 95 mmol/L — ABNORMAL LOW (ref 98–111)
Creatinine, Ser: 0.93 mg/dL (ref 0.61–1.24)
GFR, Estimated: >60 mL/min (ref >60)
Glucose, Bld: 319 mg/dL — ABNORMAL HIGH (ref 70–99)
Potassium: 4.5 mmol/L (ref 3.5–5.1)
Sodium: 132 mmol/L — ABNORMAL LOW (ref 135–145)

## 2023-06-13 ENCOUNTER — Encounter (HOSPITAL_COMMUNITY): Payer: Self-pay

## 2023-06-13 ENCOUNTER — Ambulatory Visit (HOSPITAL_COMMUNITY): Admission: RE | Admit: 2023-06-13 | Payer: No Typology Code available for payment source | Source: Ambulatory Visit

## 2023-07-05 ENCOUNTER — Ambulatory Visit (HOSPITAL_COMMUNITY): Admission: RE | Admit: 2023-07-05 | Payer: No Typology Code available for payment source | Source: Ambulatory Visit

## 2023-07-14 ENCOUNTER — Ambulatory Visit (HOSPITAL_COMMUNITY)
Admission: RE | Admit: 2023-07-14 | Discharge: 2023-07-14 | Disposition: A | Discharge status: Home / Self Care | Payer: No Typology Code available for payment source | Source: Ambulatory Visit | Attending: Student | Admitting: Student

## 2023-07-14 DIAGNOSIS — I7121 Aneurysm of the ascending aorta, without rupture: Secondary | ICD-10-CM | POA: Diagnosis present

## 2023-07-14 MED ORDER — IOHEXOL 350 MG/ML SOLN
100.0000 mL | Freq: Once | INTRAVENOUS | Status: AC | PRN
  Administered 2023-07-14: 100 mL via INTRAVENOUS

## 2023-07-19 ENCOUNTER — Telehealth: Payer: Self-pay | Admitting: Cardiovascular Disease

## 2023-07-19 NOTE — Telephone Encounter (Signed)
Patient is returning call to discuss CT results. 

## 2023-07-19 NOTE — Telephone Encounter (Signed)
-----   Message from [place] sent at 07/18/2023  8:19 PM EST ----- Please let the patient know his ascending aortic aneurysm remains overall similar to prior imaging at 4.3 cm with annual follow-up recommended. The report did mention some plaque along his aorta and coronary arteries (noted on prior imaging as well) so we will request a copy of most recent labs from his PCP as he may benefit from being on a medication to assist with cholesterol to help reduce further plaque build-up. 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 The Radiologist did mention we could get dedicated imaging of this to further quantify and this can be obtained by [place] or the [place], whichever he prefers.

## 2023-07-19 NOTE — Telephone Encounter (Signed)
Patient notified and verbalized understanding. 

## 2023-08-06 NOTE — Progress Notes (Signed)
 Cardiology Office Note    Date:  08/12/2023   ID:  Kenneth Bridges, DOB 16-Jul-1966, MRN [id number]  PCP:  Clinic, [facility]  Cardiologist: Kenneth Emmer, MD    No chief complaint on file.   History of Present Illness:    Kenneth Bridges is a 57 y.o. male with past medical history of HTN, Type II DM and OSA who presents to the office today for follow-up.  He was admitted to [facility] 03/03/22  for new-onset atrial fibrillation with RVR and was initially started on IV Cardizem . Echocardiogram did show his EF was mildly reduced at 50 to 55% with apical hypokinesis and mildly dilated left atria. Was also noted to have dilatation of the aortic root measuring 45 mm and dilatation of the ascending aorta at 40 mm. He ultimately required TEE/DCCV which was performed on 03/05/2022 with conversion to NSR with a 200 J biphasic shock. Seen by PA on d/c 03/23/22 and beta blocker d/c diuretics lowered   He has been using his CPAP at night. Also reduced his alcohol use. No  Missed doses of eliquis  CHADVASC 3  Could not tolerate Ozempic with stomach upset and no longer on it   CTA 07/16/23 showed stable 4.3 cm ascending thoracic aneurysm.   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 Knee/ankle pain with prolonged standing    Past Medical History:  Diagnosis Date   Atrial fibrillation (HCC)    a. 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)   Sleep apnea    Wears glasses     Past Surgical History:  Procedure Laterality Date   ANKLE FRACTURE SURGERY Left 1988   CARDIOVERSION N/A 03/05/2022   Procedure: CARDIOVERSION;  Surgeon: Kenneth Stanly LABOR, MD;  Location: [facility] ORS;  Service: Cardiovascular;  Laterality: N/A;   COLONOSCOPY N/A 05/19/2016   Procedure: COLONOSCOPY;  Surgeon: Kenneth CHRISTELLA Hollingshead, MD;  Location: [facility]  ENDO SUITE;  Service: Endoscopy;  Laterality: N/A;  9:30 am   HERNIA REPAIR Left    inguinal   KNEE ARTHROSCOPY WITH MEDIAL MENISECTOMY Right 08/28/2019   Procedure: KNEE ARTHROSCOPY WITH MEDIAL MENISCECTOMY;  Surgeon: Kenneth Taft BRAVO, MD;  Location: [facility] ORS;  Service: Orthopedics;  Laterality: Right;   NASAL SEPTOPLASTY W/ TURBINOPLASTY  02/13/2016   NASAL SEPTOPLASTY WITH TURBINATE REDUCTION (Bilateral)   NASAL SEPTOPLASTY W/ TURBINOPLASTY Bilateral 02/13/2016   Procedure: NASAL SEPTOPLASTY WITH TURBINATE REDUCTION;  Surgeon: Kenneth Ricker, MD;  Location: [facility] OR;  Service: ENT;  Laterality: Bilateral;   SYNDESMOSIS REPAIR Left 11/07/2020   Procedure: SYNDESMOSIS REPAIR AND MEDIAL DELTOID REPAIR LEFT ANKLE;  Surgeon: Kenneth Taft BRAVO, MD;  Location: [facility];  Service: Orthopedics;  Laterality: Left;   TEE WITHOUT CARDIOVERSION N/A 03/05/2022   Procedure: TRANSESOPHAGEAL ECHOCARDIOGRAM (TEE);  Surgeon: Kenneth Stanly LABOR, MD;  Location: [facility];  Service: Cardiovascular;  Laterality: N/A;    Current Medications: Outpatient Medications Prior to Visit  Medication Sig Dispense Refill   blood glucose meter kit and supplies KIT Dispense based on patient and insurance preference. Use up to four times daily as directed. 1 each 0   hydrochlorothiazide  (HYDRODIURIL ) 25 MG tablet Take 25 mg by mouth daily.     insulin  glargine-yfgn (SEMGLEE ) 100 UNIT/ML Pen INJECT 24 UNITS SUBCUTANEOUSLY EVERY MORNING (USE WITHIN 28  DAYS AFTER OPENING PEN)     insulin  lispro (HUMALOG ) 100 UNIT/ML cartridge Inject 0.04 mLs (4 Units total) into the skin 3 (three) times daily with meals. 15 mL 11   Insulin  Syringe-Needle U-100 (INSULIN  SYRINGE .5CC/31GX5/16) 31G X 5/16 0.5 ML MISC Take as directed 100 each 3   metFORMIN (GLUCOPHAGE-XR) 750 MG 24 hr tablet Take 750 mg by mouth daily with breakfast.     metoprolol  succinate (TOPROL  XL) 25 MG 24 hr tablet Take 1 tablet (25 mg total) by mouth daily. 90 tablet 3    rivaroxaban  (XARELTO ) 20 MG TABS tablet Take 1 tablet (20 mg total) by mouth daily with supper. 90 tablet 3   sildenafil (VIAGRA) 100 MG tablet Take by mouth as needed.     spironolactone  (ALDACTONE ) 25 MG tablet Take 12.5 mg by mouth daily.     telmisartan  (MICARDIS ) 80 MG tablet Take 80 mg by mouth daily.     No facility-administered medications prior to visit.     Allergies:   Patient has no known allergies.   Social History   Socioeconomic History   Marital status: Married    Spouse name: Not on file   Number of children: Not on file   Years of education: Not on file   Highest education level: Not on file  Occupational History   Not on file  Tobacco Use   Smoking status: Never   Smokeless tobacco: Never  Vaping Use   Vaping status: Never Used  Substance and Sexual Activity   Alcohol use: Not Currently    Comment: social beer drinker on the weekends (drinks 2 beers on Friday and 2 beers on Saturday)    Drug use: No   Sexual activity: Not on file  Other Topics Concern   Not on file  Social History Narrative   Not on file   Social Drivers of Health   Financial Resource Strain: Not on file  Food Insecurity: Not on file  Transportation Needs: Not on file  Physical Activity: Not on file  Stress: Not on file  Social Connections: Unknown (09/27/2022)   [facility] from [facility], [facility]   Social Network    Social Network: Not on file     Family History:  The patient's family history includes Cancer in his mother.   Review of Systems:    Please see the history of present illness.     All other systems reviewed and are otherwise negative except as noted above.   Physical Exam:    VS:  BP 132/82   Pulse 87   Ht 5' 11 (1.803 m)   Wt (!) 306 lb (138.8 kg)   SpO2 95%   BMI 42.68 kg/m    General: Well developed, well nourished,male appearing in no acute distress. Head: Normocephalic, atraumatic. Neck: No carotid bruits. JVD not elevated.  Lungs:  Respirations regular and unlabored, without wheezes or rales.  Heart: Regular rate and rhythm. No S3 or S4.  No murmur, no rubs, or gallops appreciated. Abdomen: Appears non-distended. No obvious abdominal masses. Msk:  Strength and tone appear normal for age. No obvious joint deformities or effusions. Extremities: No clubbing or cyanosis. No pitting edema.  Distal pedal pulses are 2+ bilaterally. Neuro: Alert and oriented X 3. Moves all extremities spontaneously. No focal deficits noted. Psych:  Responds to questions appropriately with a normal affect. Skin: No rashes or lesions noted  Wt Readings from Last 3 Encounters:  08/12/23 (!) 306 lb (138.8 kg)  02/08/23 (!) 314 lb 9.6 oz (142.7 kg)  06/14/22 (!) 309 lb 3.2 oz (140.3 kg)     Studies/Labs Reviewed:   EKG:  EKG is ordered today.  The ekg ordered today demonstrates NSR, HR 69 with RAD and incomplete RBBB.   Recent Labs: 11/03/2022: B Natriuretic Peptide 40.0 06/10/2023: BUN 15; Creatinine, Ser 0.93; Potassium 4.5; Sodium 132   Lipid Panel No results found for: CHOL, TRIG, HDL, CHOLHDL, VLDL, LDLCALC, LDLDIRECT  Additional studies/ records that were reviewed today include:   Echocardiogram: 03/04/2022 IMPRESSIONS     1. Left ventricular ejection fraction, by estimation, is 50 to 55%. The  left ventricle has low normal function. The left ventricle demonstrates  regional wall motion abnormalities. Apical hypokinesis. There is mild left  ventricular hypertrophy. Left  ventricular diastolic parameters are indeterminate.   2. Right ventricular systolic function is normal. The right ventricular  size is normal.   3. Left atrial size was mildly dilated.   4. Right atrial size was mildly dilated.   5. The mitral valve is normal in structure. Trivial mitral valve  regurgitation. No evidence of mitral stenosis.   6. The aortic valve was not well visualized. Aortic valve regurgitation  is not visualized. No aortic  stenosis is present.   7. Aortic dilatation noted. There is dilatation of the aortic root,  measuring 45 mm. There is dilatation of the ascending aorta, measuring 40  mm.   8. The inferior vena cava is dilated in size with >50% respiratory  variability, suggesting right atrial pressure of 8 mmHg.    Plan:   In order of problems listed above:  1. Persistent Atrial Fibrillation - He underwent successful TEE/DCCV on 03/05/2022 with conversion to NSR with a 200 J biphasic shock.  Now on bid lopressor  and diuretic stopped as he is more euvolemic in NSR Continue eliquis  CHAD VASC 3 for low EF, HTN and aortic plaque on TEE   2. Aortic Root Dilatation - Stable 4.3 cm aneurysm by CTA 07/16/23   3. HTN - Has BP cuff from [facility]  on micardis  , Toprol  and diuretic      4. Type 2 DM - Hgb A1c was elevated to 11.0 during his recent admission. He has been taking Metformin along with Insulin . Ozempic d/c Trying to do low carb diet    F/U in a year   Medication Adjustments/Labs and Tests Ordered: Current medicines are reviewed at length with the patient today.  Concerns regarding medicines are outlined above.  Medication changes, Labs and Tests ordered today are listed in the Patient Instructions below. There are no Patient Instructions on file for this visit.   Signed, Kenneth Emmer, MD  08/12/2023 1:35 PM    [facility] [address] [place], [place] [address] Phone: [phone] Fax: [phone]

## 2023-08-08 ENCOUNTER — Encounter: Payer: Self-pay | Admitting: *Deleted

## 2023-08-12 ENCOUNTER — Encounter: Payer: Self-pay | Admitting: Cardiovascular Disease

## 2023-08-12 ENCOUNTER — Ambulatory Visit
Payer: No Typology Code available for payment source | Attending: Cardiovascular Disease | Admitting: Cardiovascular Disease

## 2023-08-12 VITALS — BP 132/82 | HR 87 | Ht 71.0 in | Wt 306.0 lb

## 2023-08-12 DIAGNOSIS — I4819 Other persistent atrial fibrillation: Secondary | ICD-10-CM

## 2023-08-12 DIAGNOSIS — I7121 Aneurysm of the ascending aorta, without rupture: Secondary | ICD-10-CM

## 2023-08-12 DIAGNOSIS — G4733 Obstructive sleep apnea (adult) (pediatric): Secondary | ICD-10-CM

## 2023-08-12 DIAGNOSIS — I10 Essential (primary) hypertension: Secondary | ICD-10-CM

## 2023-08-12 NOTE — Patient Instructions (Signed)
 Medication Instructions:  Your physician recommends that you continue on your current medications as directed. Please refer to the Current Medication list given to you today.  *If you need a refill on your cardiac medications before your next appointment, please call your pharmacy*   Lab Work: NONE   If you have labs (blood work) drawn today and your tests are completely normal, you will receive your results only by: MyChart Message (if you have MyChart) OR A paper copy in the mail If you have any lab test that is abnormal or we need to change your treatment, we will call you to review the results.   Testing/Procedures: NONE    Follow-Up: At [facility], you and your health needs are our priority.  As part of our continuing mission to provide you with exceptional heart care, we have created designated Provider Care Teams.  These Care Teams include your primary Cardiologist (physician) and Advanced Practice Providers (APPs -  Physician Assistants and Nurse Practitioners) who all work together to provide you with the care you need, when you need it.  We recommend signing up for the patient portal called "MyChart".  Sign up information is provided on this After Visit Summary.  MyChart is used to connect with patients for Virtual Visits (Telemedicine).  Patients are able to view lab/test results, encounter notes, upcoming appointments, etc.  Non-urgent messages can be sent to your provider as well.   To learn more about what you can do with MyChart, go to [email or web address].    Your next appointment:   1 year(s)  Provider:   You may see Charlton Haws, MD or one of the following Advanced Practice Providers on your designated Care Team:   Randall An, PA-C  Jacolyn Reedy, PA-C     Other Instructions Thank you for choosing [facility]!

## 2023-09-14 ENCOUNTER — Telehealth: Payer: Self-pay | Admitting: Cardiovascular Disease

## 2023-09-14 NOTE — Telephone Encounter (Signed)
 STAT if HR is under 50 or over 120 (normal HR is 60-100 beats per minute)  What is your heart rate? Yesterday got up to one 180 and for about 1.5 hrs its stay at 150s but last night it was down to 80s  Do you have a log of your heart rate readings (document readings)?    Do you have any other symptoms?  Anxious, nervous, and fatigue   (Would like Brittany to give him a call back)

## 2023-09-14 NOTE — Telephone Encounter (Signed)
   The patient just saw Dr. Delford last month and was doing well at that time with no reported palpitations. If he missed doses of Toprol -XL, this could have contributed to his symptoms. If he just resumed it, would see how he does with this. If more frequent symptoms, could further titrate Toprol -XL and may need to consider an event monitor to assess for recurrent atrial fibrillation if he continues to have episodes.  Signed, Laymon CHRISTELLA Qua, PA-C 09/14/2023, 1:27 PM Pager: [phone]

## 2023-09-14 NOTE — Telephone Encounter (Addendum)
 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 He says for 1 1/2 hours his HR was 120's and above. It then went back to below 100.  He then ran out of his Toprol  for three days Lenna Gerilyn Rides) and had another episode yesterday (09/13/23) as he tried to pull the starter on his weed eater. His HR was 190. He again reports elevated HR's for 90 minutes before it went back down into the 80's.He received his Toprol  today and took it.  He has not missed any Xarelto  doses.  He denies CP but does c/o SOB.  He has not checked his BP  His weight is down to 299 lbs.

## 2023-09-14 NOTE — Telephone Encounter (Signed)
 Patient agrees to continue to monitor and update us  if episodes of elevated HR persist.

## 2023-09-15 ENCOUNTER — Telehealth: Payer: Self-pay | Admitting: Cardiovascular Disease

## 2023-09-15 DIAGNOSIS — I4819 Other persistent atrial fibrillation: Secondary | ICD-10-CM

## 2023-09-15 MED ORDER — RIVAROXABAN 15 MG PO TABS: 15.0000 mg | ORAL_TABLET | Freq: Two times a day (BID) | ORAL | 0 refills | Status: DC

## 2023-09-15 MED ORDER — RIVAROXABAN 20 MG PO TABS
20.0000 mg | ORAL_TABLET | Freq: Every day | ORAL | 3 refills | Status: AC
Start: 2023-09-15 — End: ?

## 2023-09-15 MED ORDER — RIVAROXABAN 2.5 MG PO TABS: 5.0000 mg | ORAL_TABLET | Freq: Every day | ORAL | 0 refills | Status: DC

## 2023-09-15 NOTE — Telephone Encounter (Signed)
 I informed Kenneth Bridges that [facility].

## 2023-09-15 NOTE — Addendum Note (Signed)
 Addended by: Skyelar Swigart on: 09/15/2023 04:54 PM   Modules accepted: Orders

## 2023-09-15 NOTE — Telephone Encounter (Signed)
 Per [employer] RN  Please provide patient 7 15 mg tablets of xarelto  and 14 2.5 mg tablets of xarelto  to equal 20 mg to provide the patient with samples to pick up in the [place] office.  Samples packed 09/15/23

## 2023-09-15 NOTE — Telephone Encounter (Signed)
 Pt c/o medication issue:  1. Name of Medication:   rivaroxaban  (XARELTO ) 20 MG TABS tablet    2. How are you currently taking this medication (dosage and times per day)?  Take 1 tablet (20 mg total) by mouth daily with supper.    3. Are you having a reaction (difficulty breathing--STAT)? No  4. What is your medication issue? Patient was speaking with RN Dorothyann yesterday in regard to his medication. [facility], but the [facility] stated they are needing Laymon Qua to send it through community care with the [facility]. Patient stated he is currently out of medication. Please advise.

## 2023-09-15 NOTE — Telephone Encounter (Signed)
 I spoke with patient. We only discussed Toprol  yesterday. He tells me now he took his last Xarelto  just now. I do not have samples.   He needs rx to the [facility] in [place]   I will send to Anticoag clinic- [place] OFFICE WILL GIVE HIM SAMPLES OF 15 MG AND 2.5 MG TO HOLD HIM OVER  Patient verbalized understanding to take one -15 mg tablet and two- 2.5 mg tablets for a total of 20 mg daily dose

## 2023-09-15 NOTE — Telephone Encounter (Signed)
[facility]

## 2023-10-07 ENCOUNTER — Other Ambulatory Visit: Payer: Self-pay

## 2023-10-07 ENCOUNTER — Emergency Department (HOSPITAL_COMMUNITY): Payer: No Typology Code available for payment source

## 2023-10-07 ENCOUNTER — Inpatient Hospital Stay (HOSPITAL_COMMUNITY)
Admission: EM | Admit: 2023-10-07 | Discharge: 2023-10-09 | DRG: 309 | Disposition: A | Discharge status: Home / Self Care | Payer: No Typology Code available for payment source | Attending: Family Medicine | Admitting: Family Medicine

## 2023-10-07 ENCOUNTER — Encounter (HOSPITAL_COMMUNITY): Payer: Self-pay

## 2023-10-07 ENCOUNTER — Telehealth: Payer: Self-pay | Admitting: Cardiovascular Disease

## 2023-10-07 DIAGNOSIS — I4891 Unspecified atrial fibrillation: Secondary | ICD-10-CM | POA: Diagnosis not present

## 2023-10-07 DIAGNOSIS — Z7984 Long term (current) use of oral hypoglycemic drugs: Secondary | ICD-10-CM | POA: Diagnosis not present

## 2023-10-07 DIAGNOSIS — R5383 Other fatigue: Secondary | ICD-10-CM | POA: Diagnosis present

## 2023-10-07 DIAGNOSIS — I10 Essential (primary) hypertension: Secondary | ICD-10-CM | POA: Diagnosis present

## 2023-10-07 DIAGNOSIS — G4733 Obstructive sleep apnea (adult) (pediatric): Secondary | ICD-10-CM | POA: Diagnosis present

## 2023-10-07 DIAGNOSIS — Z6841 Body Mass Index (BMI) 40.0 and over, adult: Secondary | ICD-10-CM | POA: Diagnosis not present

## 2023-10-07 DIAGNOSIS — E119 Type 2 diabetes mellitus without complications: Secondary | ICD-10-CM | POA: Diagnosis present

## 2023-10-07 DIAGNOSIS — I7121 Aneurysm of the ascending aorta, without rupture: Secondary | ICD-10-CM | POA: Diagnosis present

## 2023-10-07 DIAGNOSIS — R000 Tachycardia, unspecified: Secondary | ICD-10-CM | POA: Diagnosis present

## 2023-10-07 DIAGNOSIS — Z7901 Long term (current) use of anticoagulants: Secondary | ICD-10-CM

## 2023-10-07 DIAGNOSIS — I4819 Other persistent atrial fibrillation: Secondary | ICD-10-CM | POA: Diagnosis present

## 2023-10-07 DIAGNOSIS — E66813 Obesity, class 3: Secondary | ICD-10-CM | POA: Diagnosis present

## 2023-10-07 DIAGNOSIS — Z79899 Other long term (current) drug therapy: Secondary | ICD-10-CM | POA: Diagnosis not present

## 2023-10-07 DIAGNOSIS — Z794 Long term (current) use of insulin: Secondary | ICD-10-CM | POA: Diagnosis not present

## 2023-10-07 DIAGNOSIS — E1165 Type 2 diabetes mellitus with hyperglycemia: Secondary | ICD-10-CM | POA: Diagnosis not present

## 2023-10-07 LAB — BASIC METABOLIC PANEL
Anion gap: 9 (ref 5–15)
BUN: 14 mg/dL (ref 6–20)
CO2: 22 mmol/L (ref 22–32)
Calcium: 8.8 mg/dL — ABNORMAL LOW (ref 8.9–10.3)
Chloride: 103 mmol/L (ref 98–111)
Creatinine, Ser: 1.01 mg/dL (ref 0.61–1.24)
GFR, Estimated: >60 mL/min (ref >60)
Glucose, Bld: 207 mg/dL — ABNORMAL HIGH (ref 70–99)
Potassium: 3.9 mmol/L (ref 3.5–5.1)
Sodium: 134 mmol/L — ABNORMAL LOW (ref 135–145)

## 2023-10-07 LAB — CBC
HCT: 43.2 % (ref 39.0–52.0)
Hemoglobin: 14.7 g/dL (ref 13.0–17.0)
MCH: 30.3 pg (ref 26.0–34.0)
MCHC: 34 g/dL (ref 30.0–36.0)
MCV: 89.1 fL (ref 80.0–100.0)
Platelets: 213 10*3/uL (ref 150–400)
RBC: 4.85 MIL/uL (ref 4.22–5.81)
RDW: 12.5 % (ref 11.5–15.5)
WBC: 11.1 10*3/uL — ABNORMAL HIGH (ref 4.0–10.5)
nRBC: 0 % (ref 0.0–0.2)

## 2023-10-07 LAB — HEMOGLOBIN A1C
Hgb A1c MFr Bld: 8.6 % — ABNORMAL HIGH (ref 4.8–5.6)
Mean Plasma Glucose: 200.12 mg/dL

## 2023-10-07 LAB — GLUCOSE, CAPILLARY: Glucose-Capillary: 330 mg/dL — ABNORMAL HIGH (ref 70–99)

## 2023-10-07 LAB — MAGNESIUM: Magnesium: 1.8 mg/dL (ref 1.7–2.4)

## 2023-10-07 LAB — TROPONIN I (HIGH SENSITIVITY): Troponin I (High Sensitivity): 5 ng/L (ref <18)

## 2023-10-07 LAB — CBG MONITORING, ED: Glucose-Capillary: 207 mg/dL — ABNORMAL HIGH (ref 70–99)

## 2023-10-07 LAB — BRAIN NATRIURETIC PEPTIDE: B Natriuretic Peptide: 345 pg/mL — ABNORMAL HIGH (ref 0.0–100.0)

## 2023-10-07 MED ORDER — DILTIAZEM LOAD VIA INFUSION
15.0000 mg | Freq: Once | INTRAVENOUS | Status: AC
  Administered 2023-10-07: 15 mg via INTRAVENOUS
  Filled 2023-10-07: qty 15

## 2023-10-07 MED ORDER — MAGNESIUM SULFATE 2 GM/50ML IV SOLN
2.0000 g | Freq: Once | INTRAVENOUS | Status: AC
  Administered 2023-10-07: 2 g via INTRAVENOUS
  Filled 2023-10-07: qty 50

## 2023-10-07 MED ORDER — INSULIN ASPART 100 UNIT/ML IJ SOLN
0.0000 [IU] | Freq: Every day | INTRAMUSCULAR | Status: DC
  Administered 2023-10-07 – 2023-10-08 (×2): 4 [IU] via SUBCUTANEOUS

## 2023-10-07 MED ORDER — ACETAMINOPHEN 650 MG RE SUPP: 650.0000 mg | Freq: Four times a day (QID) | RECTAL | Status: DC | PRN

## 2023-10-07 MED ORDER — RIVAROXABAN 20 MG PO TABS
20.0000 mg | ORAL_TABLET | Freq: Every day | ORAL | Status: DC
  Administered 2023-10-07 – 2023-10-08 (×2): 20 mg via ORAL
  Filled 2023-10-07 (×2): qty 1

## 2023-10-07 MED ORDER — INSULIN ASPART 100 UNIT/ML IJ SOLN
4.0000 [IU] | Freq: Three times a day (TID) | INTRAMUSCULAR | Status: DC
  Administered 2023-10-07: 4 [IU] via SUBCUTANEOUS
  Filled 2023-10-07: qty 1

## 2023-10-07 MED ORDER — FENTANYL CITRATE PF 50 MCG/ML IJ SOSY: 12.5000 ug | PREFILLED_SYRINGE | INTRAMUSCULAR | Status: DC | PRN

## 2023-10-07 MED ORDER — METOPROLOL TARTRATE 25 MG PO TABS
25.0000 mg | ORAL_TABLET | Freq: Four times a day (QID) | ORAL | Status: DC
  Administered 2023-10-07 (×2): 25 mg via ORAL
  Filled 2023-10-07 (×2): qty 1

## 2023-10-07 MED ORDER — INSULIN GLARGINE-YFGN 100 UNIT/ML ~~LOC~~ SOLN
12.0000 [IU] | Freq: Every day | SUBCUTANEOUS | Status: DC
  Filled 2023-10-07: qty 0.12

## 2023-10-07 MED ORDER — OXYCODONE HCL 5 MG PO TABS: 5.0000 mg | ORAL_TABLET | ORAL | Status: DC | PRN

## 2023-10-07 MED ORDER — LEVALBUTEROL HCL 0.63 MG/3ML IN NEBU: 0.6300 mg | INHALATION_SOLUTION | Freq: Four times a day (QID) | RESPIRATORY_TRACT | Status: DC | PRN

## 2023-10-07 MED ORDER — ONDANSETRON HCL 4 MG PO TABS
4.0000 mg | ORAL_TABLET | Freq: Four times a day (QID) | ORAL | Status: DC | PRN
Start: 2023-10-07 — End: 2023-10-09

## 2023-10-07 MED ORDER — SENNOSIDES-DOCUSATE SODIUM 8.6-50 MG PO TABS: 1.0000 | ORAL_TABLET | Freq: Every evening | ORAL | Status: DC | PRN

## 2023-10-07 MED ORDER — DILTIAZEM HCL-DEXTROSE 125-5 MG/125ML-% IV SOLN (PREMIX)
5.0000 mg/h | INTRAVENOUS | Status: AC
  Administered 2023-10-07: 15 mg/h via INTRAVENOUS
  Administered 2023-10-07: 5 mg/h via INTRAVENOUS
  Administered 2023-10-08: 12.5 mg/h via INTRAVENOUS
  Filled 2023-10-07 (×3): qty 125

## 2023-10-07 MED ORDER — TRAZODONE HCL 50 MG PO TABS: 25.0000 mg | ORAL_TABLET | Freq: Every evening | ORAL | Status: DC | PRN

## 2023-10-07 MED ORDER — INSULIN ASPART 100 UNIT/ML IJ SOLN
0.0000 [IU] | Freq: Three times a day (TID) | INTRAMUSCULAR | Status: DC
  Administered 2023-10-07: 7 [IU] via SUBCUTANEOUS
  Administered 2023-10-08 (×3): 11 [IU] via SUBCUTANEOUS
  Administered 2023-10-09: 4 [IU] via SUBCUTANEOUS
  Filled 2023-10-07: qty 1

## 2023-10-07 MED ORDER — POTASSIUM CHLORIDE CRYS ER 20 MEQ PO TBCR
40.0000 meq | EXTENDED_RELEASE_TABLET | Freq: Once | ORAL | Status: AC
  Administered 2023-10-07: 40 meq via ORAL
  Filled 2023-10-07: qty 2

## 2023-10-07 MED ORDER — ACETAMINOPHEN 325 MG PO TABS: 650.0000 mg | ORAL_TABLET | Freq: Four times a day (QID) | ORAL | Status: DC | PRN

## 2023-10-07 MED ORDER — CHLORHEXIDINE GLUCONATE CLOTH 2 % EX PADS
6.0000 | MEDICATED_PAD | Freq: Every day | CUTANEOUS | Status: DC
  Administered 2023-10-08: 6 via TOPICAL

## 2023-10-07 MED ORDER — ONDANSETRON HCL 4 MG/2ML IJ SOLN: 4.0000 mg | Freq: Four times a day (QID) | INTRAMUSCULAR | Status: DC | PRN

## 2023-10-07 NOTE — ED Provider Notes (Signed)
 [place] EMERGENCY DEPARTMENT AT [facility] Provider Note   CSN: [id number] Arrival date & time: 10/07/23  1211     History  Chief Complaint  Patient presents with   AFIB rvr    Kenneth Bridges is a 58 y.o. male.  HPI 58 year old male presents with Afib.  He feels like he has been in A-fib for probably 2 weeks.  He went to an outpatient CT scan and as they were setting him up they noted that his heart rate was high and found to be in A-fib.  He thinks he went into A-fib 2 weeks ago when he was golfing he noticed that he very tired on just the first hole.  Heart rate was high but he thought this because he was out of shape.  He has been having some dyspnea on exertion and fatigue.  Has had some chest pressure.  Home Medications Prior to Admission medications   Medication Sig Start Date End Date Taking? Authorizing Provider  hydrochlorothiazide (HYDRODIURIL) 25 MG tablet Take 25 mg by mouth daily.   Yes [provider]  insulin glargine-yfgn (SEMGLEE) 100 UNIT/ML Pen INJECT 24 UNITS SUBCUTANEOUSLY EVERY MORNING (USE WITHIN 28 DAYS AFTER OPENING PEN) 04/21/22  Yes [provider]  insulin lispro (HUMALOG) 100 UNIT/ML cartridge Inject 0.04 mLs (4 Units total) into the skin 3 (three) times daily with meals. 03/05/22  Yes Sherryll Burger, Pratik D, DO  metFORMIN (GLUCOPHAGE-XR) 750 MG 24 hr tablet Take 750 mg by mouth daily with breakfast. 05/12/22  Yes [provider]  metoprolol succinate (TOPROL XL) 25 MG 24 hr tablet Take 1 tablet (25 mg total) by mouth daily. 02/08/23  Yes Strader, [place] M, PA-C  rivaroxaban (XARELTO) 20 MG TABS tablet Take 1 tablet (20 mg total) by mouth daily with supper. 09/15/23  Yes Strader, [place] M, PA-C  sildenafil (VIAGRA) 100 MG tablet Take by mouth as needed. 12/15/20  Yes [provider]  spironolactone (ALDACTONE) 25 MG tablet Take 12.5 mg by mouth daily.   Yes [provider]  telmisartan (MICARDIS) 80 MG  tablet Take 80 mg by mouth daily.   Yes [provider]  atorvastatin (LIPITOR) 10 MG tablet Take by mouth. Patient not taking: Reported on 10/07/2023 08/18/23   [provider]  blood glucose meter kit and supplies KIT Dispense based on patient and insurance preference. Use up to four times daily as directed. 03/05/22   Sherryll Burger, Pratik D, DO  Insulin Syringe-Needle U-100 (INSULIN SYRINGE .5CC/31GX5/16") 31G X 5/16" 0.5 ML MISC Take as directed 03/05/22   Maurilio Lovely D, DO      Allergies    Patient has no known allergies.    Review of Systems   Review of Systems  Constitutional:  Negative for fever.  Respiratory:  Positive for cough and shortness of breath.   Cardiovascular:  Positive for chest pain and leg swelling.    Physical Exam Updated Vital Signs BP (!) 120/99   Pulse (!) 136   Temp 97.8 F (36.6 C) (Oral)   Resp 17   Ht 5\' 11"  (1.803 m)   Wt (!) 141.1 kg   SpO2 91%   BMI 43.38 kg/m  Physical Exam Vitals and nursing note reviewed.  Constitutional:      General: He is not in acute distress.    Appearance: He is well-developed. He is not ill-appearing or diaphoretic.  HENT:     Head: Normocephalic and atraumatic.  Cardiovascular:  Rate and Rhythm: Tachycardia present. Rhythm irregular.     Heart sounds: Normal heart sounds.  Pulmonary:     Effort: Pulmonary effort is normal.     Breath sounds: Normal breath sounds. No wheezing.  Abdominal:     Palpations: Abdomen is soft.     Tenderness: There is no abdominal tenderness.     Hernia: A hernia (reducible) is present.  Musculoskeletal:     Right lower leg: Edema present.     Left lower leg: Edema present.     Comments: Mild ankle edema, L>R (baseline per patient).  Skin:    General: Skin is warm and dry.  Neurological:     Mental Status: He is alert.     ED Results / Procedures / Treatments   Labs (all labs ordered are listed, but only abnormal results are displayed) Labs Reviewed  BASIC  METABOLIC PANEL - Abnormal; Notable for the following components:      Result Value   Sodium 134 (*)    Glucose, Bld 207 (*)    Calcium 8.8 (*)    All other components within normal limits  CBC - Abnormal; Notable for the following components:   WBC 11.1 (*)    All other components within normal limits  BRAIN NATRIURETIC PEPTIDE - Abnormal; Notable for the following components:   B Natriuretic Peptide 345.0 (*)    All other components within normal limits  MAGNESIUM  HEMOGLOBIN A1C  HIV ANTIBODY (ROUTINE TESTING W REFLEX)  TROPONIN I (HIGH SENSITIVITY)    EKG EKG Interpretation Date/Time:  Friday October 07 2023 13:46:16 EST Ventricular Rate:  148 PR Interval:    QRS Duration:  93 QT Interval:  319 QTC Calculation: 501 R Axis:   106  Text Interpretation: Atrial fibrillation with RVR Anteroseptal infarct, age indeterminate Prolonged QT interval Confirmed by Pricilla Loveless [phone]) on 10/07/2023 3:46:01 PM  Radiology DG Chest Portable 1 View Result Date: 10/07/2023 CLINICAL DATA:  Dyspnea.  AFib. EXAM: PORTABLE CHEST 1 VIEW COMPARISON:  03/03/2022. FINDINGS: Bilateral lung fields are clear. Bilateral costophrenic angles are clear. Stable cardio-mediastinal silhouette. No acute osseous abnormalities. The soft tissues are within normal limits. IMPRESSION: No active disease. Electronically Signed   By: Jules Schick M.D.   On: 10/07/2023 14:50    Procedures .Critical Care  Performed by: Pricilla Loveless, MD Authorized by: Pricilla Loveless, MD   Critical care provider statement:    Critical care time (minutes):  35   Critical care time was exclusive of:  Separately billable procedures and treating other patients   Critical care was necessary to treat or prevent imminent or life-threatening deterioration of the following conditions:  Circulatory failure   Critical care was time spent personally by me on the following activities:  Development of treatment plan with patient or  surrogate, discussions with consultants, evaluation of patient's response to treatment, examination of patient, ordering and review of laboratory studies, ordering and review of radiographic studies, ordering and performing treatments and interventions, pulse oximetry, re-evaluation of patient's condition and review of old charts     Medications Ordered in ED Medications  diltiazem (CARDIZEM) 1 mg/mL load via infusion 15 mg (15 mg Intravenous Bolus from Bag 10/07/23 1422)    And  diltiazem (CARDIZEM) 125 mg in dextrose 5% 125 mL (1 mg/mL) infusion (5 mg/hr Intravenous New Bag/Given 10/07/23 1422)  rivaroxaban (XARELTO) tablet 20 mg (has no administration in time range)  insulin aspart (novoLOG) injection 0-20 Units (has no administration in time range)  insulin aspart (novoLOG) injection 0-5 Units (has no administration in time range)  insulin aspart (novoLOG) injection 4 Units (has no administration in time range)  insulin glargine-yfgn (SEMGLEE) injection 12 Units (has no administration in time range)  acetaminophen (TYLENOL) tablet 650 mg (has no administration in time range)    Or  acetaminophen (TYLENOL) suppository 650 mg (has no administration in time range)  oxyCODONE (Oxy IR/ROXICODONE) immediate release tablet 5 mg (has no administration in time range)  fentaNYL (SUBLIMAZE) injection 12.5 mcg (has no administration in time range)  traZODone (DESYREL) tablet 25 mg (has no administration in time range)  senna-docusate (Senokot-S) tablet 1 tablet (has no administration in time range)  ondansetron (ZOFRAN) tablet 4 mg (has no administration in time range)    Or  ondansetron (ZOFRAN) injection 4 mg (has no administration in time range)  levalbuterol (XOPENEX) nebulizer solution 0.63 mg (has no administration in time range)    ED Course/ Medical Decision Making/ A&P                                 Medical Decision Making Amount and/or Complexity of Data Reviewed Labs: ordered.     Details: Equivocal BNP Radiology: ordered and independent interpretation performed.    Details: No CHF ECG/medicine tests: ordered and independent interpretation performed.    Details: A-fib with RVR  Risk Decision regarding hospitalization.   Patient presents with A-fib with RVR.  He does tell me that he has missed at least a couple doses (once this morning and once earlier in the week) of Xarelto.  I do not think cardioversion is warranted.  He will be given rate control with Cardizem bolus and drip.  Discussed with Dr. Wyline Mood of cardiology who will see patient.  Dr. Laural Benes consulted for admission.        Final Clinical Impression(s) / ED Diagnoses Final diagnoses:  Atrial fibrillation with RVR [facility])    Rx / DC Orders ED Discharge Orders     None         Pricilla Loveless, MD 10/07/23 1546

## 2023-10-07 NOTE — H&P (Signed)
 History and Physical  [facility]  Kenneth Bridges ZOX:[id number] DOB: [birth year] DOA: 10/07/2023  PCP: [facility], [facility]  Patient coming from: Home  Level of care: Stepdown  I have personally briefly reviewed patient's old medical records in [facility]  Chief Complaint: SOB, abnormal EKG, sent from [place]  HPI: Kenneth Bridges is a 58 year old gentleman with history of hypertension, type 2 diabetes mellitus, OSA, persistent atrial fibrillation status post DCCV 02/25/2022, fully anticoagulated with rivaroxaban 20 mg daily, dilation of ascending thoracic aorta reports that he had been doing fairly well with control of his atrial fibrillation.  He reports that he missed a dose of rivaroxaban about 1 week ago (10/01/23) but has taken it consistently since then.  He reports that for the past couple of weeks he has had episodes of shortness of breath especially with exertion.  Earlier today he was seen at the [facility] for scheduled labs and CT scan and he could only walk a few feet before having to sit down.  He denies having any symptoms of chest pain.  He has been having palpitations intermittently.  He denies syncope.  He denies increasing edema in the extremities.  He had an EKG at the [place] today and it demonstrated A-fib with RVR.  He was sent to the emergency department for more evaluation.  He reports that he has had fluctuating heart rate patterns in the past 2 weeks from the low 30s up to 140-150.  In the ED he has been started on IV diltiazem infusion.  He reports that when he was initially diagnosed with A-fib he was treated medically for 1 to 2 days and subsequently had to have a cardioversion to get the rate controlled.  He is going to be seen by our cardiology service today and he will be admitted to the stepdown ICU on a continuous IV diltiazem infusion.    Past Medical History:  Diagnosis Date   Atrial fibrillation [facility])    a. 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)   Sleep apnea    Wears glasses     Past Surgical History:  Procedure Laterality Date   ANKLE FRACTURE SURGERY Left 1988   CARDIOVERSION N/A 03/05/2022   Procedure: CARDIOVERSION;  Surgeon: Christell Constant, MD;  Location: [facility] ORS;  Service: Cardiovascular;  Laterality: N/A;   COLONOSCOPY N/A 05/19/2016   Procedure: COLONOSCOPY;  Surgeon: Corbin Ade, MD;  Location: [facility] ENDO SUITE;  Service: Endoscopy;  Laterality: N/A;  9:30 am   HERNIA REPAIR Left    inguinal   KNEE ARTHROSCOPY WITH MEDIAL MENISECTOMY Right 08/28/2019   Procedure: KNEE ARTHROSCOPY WITH MEDIAL MENISCECTOMY;  Surgeon: Vickki Hearing, MD;  Location: [facility] ORS;  Service: Orthopedics;  Laterality: Right;   NASAL SEPTOPLASTY W/ TURBINOPLASTY  02/13/2016   NASAL SEPTOPLASTY WITH TURBINATE REDUCTION (Bilateral)   NASAL SEPTOPLASTY W/ TURBINOPLASTY Bilateral 02/13/2016   Procedure: NASAL SEPTOPLASTY WITH TURBINATE REDUCTION;  Surgeon: Christia Reading, MD;  Location: [facility] OR;  Service: ENT;  Laterality: Bilateral;   SYNDESMOSIS REPAIR Left 11/07/2020   Procedure: SYNDESMOSIS REPAIR AND MEDIAL DELTOID REPAIR LEFT ANKLE;  Surgeon: Vickki Hearing, MD;  Location: [facility] ORS;  Service: Orthopedics;  Laterality: Left;   TEE WITHOUT CARDIOVERSION N/A 03/05/2022   Procedure: TRANSESOPHAGEAL ECHOCARDIOGRAM (TEE);  Surgeon: Christell Constant, MD;  Location: [facility] ORS;  Service:  Cardiovascular;  Laterality: N/A;     reports that he has never smoked. He has never used smokeless tobacco. He reports that he does not currently use alcohol. He reports that he does not use drugs.  No Known Allergies  Family History  Problem Relation Age of Onset   Cancer Mother    Colon cancer Neg Hx    Colon polyps Neg Hx     Prior to Admission medications   Medication Sig Start Date End Date Taking? Authorizing Provider  hydrochlorothiazide  (HYDRODIURIL) 25 MG tablet Take 25 mg by mouth daily.   Yes [provider]  insulin glargine-yfgn (SEMGLEE) 100 UNIT/ML Pen INJECT 24 UNITS SUBCUTANEOUSLY EVERY MORNING (USE WITHIN 28 DAYS AFTER OPENING PEN) 04/21/22  Yes [provider]  insulin lispro (HUMALOG) 100 UNIT/ML cartridge Inject 0.04 mLs (4 Units total) into the skin 3 (three) times daily with meals. 03/05/22  Yes Sherryll Burger, Pratik D, DO  metFORMIN (GLUCOPHAGE-XR) 750 MG 24 hr tablet Take 750 mg by mouth daily with breakfast. 05/12/22  Yes [provider]  metoprolol succinate (TOPROL XL) 25 MG 24 hr tablet Take 1 tablet (25 mg total) by mouth daily. 02/08/23  Yes Strader, [place] M, PA-C  rivaroxaban (XARELTO) 20 MG TABS tablet Take 1 tablet (20 mg total) by mouth daily with supper. 09/15/23  Yes Strader, [place] M, PA-C  sildenafil (VIAGRA) 100 MG tablet Take by mouth as needed. 12/15/20  Yes [provider]  spironolactone (ALDACTONE) 25 MG tablet Take 12.5 mg by mouth daily.   Yes [provider]  telmisartan (MICARDIS) 80 MG tablet Take 80 mg by mouth daily.   Yes [provider]  atorvastatin (LIPITOR) 10 MG tablet Take by mouth. Patient not taking: Reported on 10/07/2023 08/18/23   [provider]  blood glucose meter kit and supplies KIT Dispense based on patient and insurance preference. Use up to four times daily as directed. 03/05/22   Sherryll Burger, Pratik D, DO  Insulin Syringe-Needle U-100 (INSULIN SYRINGE .5CC/31GX5/16") 31G X 5/16" 0.5 ML MISC Take as directed 03/05/22   Maurilio Lovely D, DO    Physical Exam: Vitals:   10/07/23 1424 10/07/23 1425 10/07/23 1426 10/07/23 1430  BP:  (!) 136/106  123/86  Pulse: (!) 132 (!) 141 (!) 134 (!) 137  Resp: (!) 22 17 18 19   Temp:      TempSrc:      SpO2: 94% 93% 92% 91%  Weight:      Height:        Constitutional: NAD, calm, comfortable, obesity Eyes: PERRL, lids and conjunctivae normal ENMT: Mucous membranes are moist. Posterior  pharynx clear of any exudate or lesions.Normal dentition.  Neck: normal, supple, no masses, no thyromegaly Respiratory: clear to auscultation bilaterally, no wheezing, no crackles. Normal respiratory effort. No accessory muscle use.  Cardiovascular: tachycardic rate, irregularly irregular, normal s1, s2 sounds, no murmurs / rubs / gallops. 1+ extremity edema L>R. 2+ pedal pulses. No carotid bruits.  Abdomen: obese, no tenderness, no masses palpated. No hepatosplenomegaly. Bowel sounds positive.  Musculoskeletal: no clubbing / cyanosis. No joint deformity upper and lower extremities. Good ROM, no contractures. Normal muscle tone.  Skin: no rashes, lesions, ulcers. No induration Neurologic: CN 2-12 grossly intact. Sensation intact, DTR normal. Strength 5/5 in all 4.  Psychiatric: Normal judgment and insight. Alert and oriented x 3. Normal mood.   Labs on Admission: I have personally reviewed following labs and imaging studies  CBC: Recent Labs  Lab 10/07/23 1302  WBC 11.1*  HGB 14.7  HCT 43.2  MCV 89.1  PLT 213   Basic Metabolic Panel: Recent Labs  Lab 10/07/23 1302  NA 134*  K 3.9  CL 103  CO2 22  GLUCOSE 207*  BUN 14  CREATININE 1.01  CALCIUM 8.8*  MG 1.8   GFR: Estimated Creatinine Clearance: 113.2 mL/min (by C-G formula based on SCr of 1.01 mg/dL). Liver Function Tests: No results for input(s): "AST", "ALT", "ALKPHOS", "BILITOT", "PROT", "ALBUMIN" in the last 168 hours. No results for input(s): "LIPASE", "AMYLASE" in the last 168 hours. No results for input(s): "AMMONIA" in the last 168 hours. Coagulation Profile: No results for input(s): "INR", "PROTIME" in the last 168 hours. Cardiac Enzymes: No results for input(s): "CKTOTAL", "CKMB", "CKMBINDEX", "TROPONINI" in the last 168 hours. BNP (last 3 results) No results for input(s): "PROBNP" in the last 8760 hours. HbA1C: No results for input(s): "HGBA1C" in the last 72 hours. CBG: No results for input(s): "GLUCAP"  in the last 168 hours. Lipid Profile: No results for input(s): "CHOL", "HDL", "LDLCALC", "TRIG", "CHOLHDL", "LDLDIRECT" in the last 72 hours. Thyroid Function Tests: No results for input(s): "TSH", "T4TOTAL", "FREET4", "T3FREE", "THYROIDAB" in the last 72 hours. Anemia Panel: No results for input(s): "VITAMINB12", "FOLATE", "FERRITIN", "TIBC", "IRON", "RETICCTPCT" in the last 72 hours. Urine analysis:    Component Value Date/Time   COLORURINE YELLOW 05/22/2016 1001   APPEARANCEUR CLEAR 05/22/2016 1001   LABSPEC 1.010 05/22/2016 1001   PHURINE 7.0 05/22/2016 1001   GLUCOSEU NEGATIVE 05/22/2016 1001   HGBUR NEGATIVE 05/22/2016 1001   BILIRUBINUR NEGATIVE 05/22/2016 1001   KETONESUR 40 (A) 05/22/2016 1001   PROTEINUR NEGATIVE 05/22/2016 1001   NITRITE NEGATIVE 05/22/2016 1001   LEUKOCYTESUR NEGATIVE 05/22/2016 1001    Radiological Exams on Admission: DG Chest Portable 1 View Result Date: 10/07/2023 CLINICAL DATA:  Dyspnea.  AFib. EXAM: PORTABLE CHEST 1 VIEW COMPARISON:  03/03/2022. FINDINGS: Bilateral lung fields are clear. Bilateral costophrenic angles are clear. Stable cardio-mediastinal silhouette. No acute osseous abnormalities. The soft tissues are within normal limits. IMPRESSION: No active disease. Electronically Signed   By: Jules Schick M.D.   On: 10/07/2023 14:50   EKG: Independently reviewed. Atrial fibrillation with RVR   Assessment/Plan Principal Problem:   Atrial fibrillation with RVR (HCC) Active Problems:   Obstructive sleep apnea   Diabetes (HCC)   Essential hypertension   Obesity, Class III, BMI 40-49.9 (morbid obesity) (HCC)   Persistent atrial fibrillation (HCC)    Atrial Fibrillation with RVR - pt remains on IV diltiazem infusion, titratable  - BPs tolerating infusion so far - rivaroxaban for full anticoagulation  - hold repeat Echo until heart rates better managed - follow up TSH testing  - follow electrolytes   Type 2 Diabetes mellitus, insulin  requiring - follow up A1c testing - continue SSI coverage, with prandial, basal coverage - frequent CBG monitoring ordered   Essential  hypertension  - holding home oral BP meds while on IV diltiazem infusion  OSA - will offer nightly CPAP while in hospital   Critical Care Procedure Note Authorized and Performed by: Maryln Manuel MD  Total Critical Care time:  63 mins  Due to a high probability of clinically significant, life threatening deterioration, the patient required my highest level of preparedness to intervene emergently and I personally spent this critical care time directly and personally managing the patient.  This critical care time included obtaining a history; examining the patient, pulse oximetry; ordering and review of studies;  arranging urgent treatment with development of a management plan; evaluation of patient's response of treatment; frequent reassessment; and discussions with other providers.  This critical care time was performed to assess and manage the high probability of imminent and life threatening deterioration that could result in multi-organ failure.  It was exclusive of separately billable procedures and treating other patients and teaching time.    DVT prophylaxis: rivaroxaban   Code Status: Full   Family Communication:   Disposition Plan: admit to stepdown ICU   Consults called: cardiology   Admission status: INP   Level of care: Stepdown Standley Dakins MD [facility] How to contact the [facility] Attending or Consulting provider 7A - 7P or covering provider during after hours 7P -7A, for this patient?  Check the care team in [facility] and look for a) attending/consulting [facility] provider listed and b) the [facility] team listed Log into [email or web address] and use [place]'s universal password to access. If you do not have the password, please contact the hospital operator. Locate the [facility] provider you are looking for under [facility] and page to a number that you  can be directly reached. If you still have difficulty reaching the provider, please page the [facility] (Director on Call) for the Hospitalists listed on amion for assistance.   If 7PM-7AM, please contact night-coverage [email or web address] Password [facility]  10/07/2023, 3:17 PM

## 2023-10-07 NOTE — Hospital Course (Addendum)
 58 year old gentleman with history of hypertension, type 2 diabetes mellitus, OSA, persistent atrial fibrillation status post DCCV 02/25/2022, fully anticoagulated with rivaroxaban 20 mg daily, dilation of ascending thoracic aorta reports that he had been doing fairly well with control of his atrial fibrillation.  He reports that he missed a dose of rivaroxaban about 1 week ago (10/01/23) but has taken it consistently since then.  He reports that for the past couple of weeks he has had episodes of shortness of breath especially with exertion.  Earlier today he was seen at the [facility] for scheduled labs and CT scan and he could only walk a few feet before having to sit down.  He denies having any symptoms of chest pain.  He has been having palpitations intermittently.  He denies syncope.  He denies increasing edema in the extremities.  He had an EKG at the [place] today and it demonstrated A-fib with RVR.  He was sent to the emergency department for more evaluation.  He reports that he has had fluctuating heart rate patterns in the past 2 weeks from the low 30s up to 140-150.  In the ED he has been started on IV diltiazem infusion.  He reports that when he was initially diagnosed with A-fib he was treated medically for 1 to 2 days and subsequently had to have a cardioversion to get the rate controlled.  He is going to be seen by our cardiology service today and he will be admitted to the stepdown ICU on a continuous IV diltiazem infusion.

## 2023-10-07 NOTE — Telephone Encounter (Signed)
 Patient's wife is calling to let Dr. Eden Emms know that [facility] afib.

## 2023-10-07 NOTE — Consult Note (Addendum)
 Cardiology Consultation   Patient ID: Kenneth Bridges MRN: [id number]; DOB: 03-16-1966  Admit date: 10/07/2023 Date of Consult: 10/07/2023  PCP:  [facility] Providers Cardiologist:  Charlton Haws, MD   {    Patient Profile:   Kenneth Bridges is a 58 y.o. male with a hx of HTN, OSA, DM2, PAF, ascending aortic aneurysm who is being seen 10/07/2023 for the evaluation of SOB/tachycardia  at the request of Dr Laural Benes.  History of Present Illness:   Kenneth Bridges 58 yo male history of HTN, OSA, DM2, PAF, ascending aortic aneurysm, presents with tachycardia.  Afib diagnosed 02/2022 during admission. After attempts at rate control on high dose dilt gtt and with oral lopressor ultimately required TEE/DCCV that admission  He reports symptoms of SOB and fatigue started about 2 weeks ago. No specific palpitations. Compliant with meds though has missed some doses of xarelto.    K 3.9 Cr 1.01 BUN 14 Mg 1.8 EKG afib 148 CXR no acute process   Past Medical History:  Diagnosis Date   Atrial fibrillation (HCC)    a. 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)   Sleep apnea    Wears glasses     Past Surgical History:  Procedure Laterality Date   ANKLE FRACTURE SURGERY Left 1988   CARDIOVERSION N/A 03/05/2022   Procedure: CARDIOVERSION;  Surgeon: Christell Constant, MD;  Location: [facility] ORS;  Service: Cardiovascular;  Laterality: N/A;   COLONOSCOPY N/A 05/19/2016   Procedure: COLONOSCOPY;  Surgeon: Corbin Ade, MD;  Location: [facility] ENDO SUITE;  Service: Endoscopy;  Laterality: N/A;  9:30 am   HERNIA REPAIR Left    inguinal   KNEE ARTHROSCOPY WITH MEDIAL MENISECTOMY Right 08/28/2019   Procedure: KNEE ARTHROSCOPY WITH MEDIAL MENISCECTOMY;  Surgeon: Vickki Hearing, MD;  Location: [facility] ORS;  Service: Orthopedics;  Laterality: Right;    NASAL SEPTOPLASTY W/ TURBINOPLASTY  02/13/2016   NASAL SEPTOPLASTY WITH TURBINATE REDUCTION (Bilateral)   NASAL SEPTOPLASTY W/ TURBINOPLASTY Bilateral 02/13/2016   Procedure: NASAL SEPTOPLASTY WITH TURBINATE REDUCTION;  Surgeon: Christia Reading, MD;  Location: [facility] OR;  Service: ENT;  Laterality: Bilateral;   SYNDESMOSIS REPAIR Left 11/07/2020   Procedure: SYNDESMOSIS REPAIR AND MEDIAL DELTOID REPAIR LEFT ANKLE;  Surgeon: Vickki Hearing, MD;  Location: [facility] ORS;  Service: Orthopedics;  Laterality: Left;   TEE WITHOUT CARDIOVERSION N/A 03/05/2022   Procedure: TRANSESOPHAGEAL ECHOCARDIOGRAM (TEE);  Surgeon: Christell Constant, MD;  Location: [facility] ORS;  Service: Cardiovascular;  Laterality: N/A;     Inpatient Medications: Scheduled Meds:  insulin aspart  0-20 Units Subcutaneous TID WC   insulin aspart  0-5 Units Subcutaneous QHS   insulin aspart  4 Units Subcutaneous TID WC   [START ON 10/08/2023] insulin glargine-yfgn  12 Units Subcutaneous Daily   rivaroxaban  20 mg Oral Q supper   Continuous Infusions:  diltiazem (CARDIZEM) infusion 5 mg/hr (10/07/23 1422)   PRN Meds: acetaminophen **OR** acetaminophen, fentaNYL (SUBLIMAZE) injection, levalbuterol, ondansetron **OR** ondansetron (ZOFRAN) IV, oxyCODONE, senna-docusate, traZODone  Allergies:   No Known Allergies  Social History:   Social History   Socioeconomic History   Marital status: Married    Spouse name: Not on file   Number of children: Not on file   Years of education: Not on file   Highest education level: Not on file  Occupational History   Not on file  Tobacco Use   Smoking status: Never   Smokeless tobacco: Never  Vaping Use   Vaping status: Never Used  Substance and Sexual Activity   Alcohol use: Not Currently    Comment: social beer drinker on the weekends (drinks 2 beers on Friday and 2 beers on Saturday)    Drug use: No   Sexual activity: Not on file  Other Topics Concern   Not on file  Social History  Narrative   Not on file   Social Drivers of Health   Financial Resource Strain: Not on file  Food Insecurity: Not on file  Transportation Needs: Not on file  Physical Activity: Not on file  Stress: Not on file  Social Connections: Unknown (09/27/2022)   Received from [facility]   Social Network    Social Network: Not on file  Intimate Partner Violence: Unknown (09/27/2022)   Received from [facility]   HITS    Physically Hurt: Not on file    Insult or Talk Down To: Not on file    Threaten Physical Harm: Not on file    Scream or Curse: Not on file    Family History:    Family History  Problem Relation Age of Onset   Cancer Mother    Colon cancer Neg Hx    Colon polyps Neg Hx      ROS:  Please see the history of present illness.   All other ROS reviewed and negative.     Physical Exam/Data:   Vitals:   10/07/23 1426 10/07/23 1430 10/07/23 1515 10/07/23 1545  BP:  123/86 (!) 120/99 (!) 128/96  Pulse: (!) 134 (!) 137 (!) 136 (!) 122  Resp: 18 19 17 19   Temp:      TempSrc:      SpO2: 92% 91% 91% 93%  Weight:      Height:       No intake or output data in the 24 hours ending 10/07/23 1631    10/07/2023   12:20 PM 08/12/2023    1:12 PM 02/08/2023    2:24 PM  Last 3 Weights  Weight (lbs) 311 lb 306 lb 314 lb 9.6 oz  Weight (kg) 141.069 kg 138.801 kg 142.702 kg     Body mass index is 43.38 kg/m.  General:  Well nourished, well developed, in no acute distress HEENT: normal Neck: no JVD Vascular: No carotid bruits; Distal pulses 2+ bilaterally Cardiac:  irreg, tachy Lungs:  clear to auscultation bilaterally, no wheezing, rhonchi or rales  Abd: soft, nontender, no hepatomegaly  Ext: no edema Musculoskeletal:  No deformities, BUE and BLE strength normal and equal Skin: warm and dry  Neuro:  CNs 2-12 intact, no focal abnormalities noted Psych:  Normal affect     Laboratory Data:  High Sensitivity Troponin:   Recent Labs   Lab 10/07/23 1302  TROPONINIHS 5     Chemistry Recent Labs  Lab 10/07/23 1302  NA 134*  K 3.9  CL 103  CO2 22  GLUCOSE 207*  BUN 14  CREATININE 1.01  CALCIUM 8.8*  MG 1.8  GFRNONAA >60  ANIONGAP 9    No results for input(s): "PROT", "ALBUMIN", "AST", "ALT", "ALKPHOS", "BILITOT" in the last 168 hours. Lipids No results for input(s): "CHOL", "TRIG", "HDL", "LABVLDL", "LDLCALC", "CHOLHDL" in the last 168 hours.  Hematology Recent Labs  Lab 10/07/23 1302  WBC 11.1*  RBC 4.85  HGB 14.7  HCT 43.2  MCV 89.1  MCH 30.3  MCHC 34.0  RDW 12.5  PLT 213   Thyroid No results for input(s): "TSH", "FREET4" in the last 168 hours.  BNP Recent Labs  Lab 10/07/23 1302  BNP 345.0*    DDimer No results for input(s): "DDIMER" in the last 168 hours.   Radiology/Studies:  DG Chest Portable 1 View Result Date: 10/07/2023 CLINICAL DATA:  Dyspnea.  AFib. EXAM: PORTABLE CHEST 1 VIEW COMPARISON:  03/03/2022. FINDINGS: Bilateral lung fields are clear. Bilateral costophrenic angles are clear. Stable cardio-mediastinal silhouette. No acute osseous abnormalities. The soft tissues are within normal limits. IMPRESSION: No active disease. Electronically Signed   By: Jules Schick M.D.   On: 10/07/2023 14:50     Assessment and Plan:   1.PAF/Afib with RVR - appears to be first recurrence since his 02/2022 DCCV - presented with afib with RVR - had missed some doses of xarelto, unfortunately was not a candidate for electrical cardioversion in the ER - started on dilt gtt for rate control,aat 15 with still elevated rates. Add lopressor 25mg  every 6 hours with hold parameters. Can titrate over the weekend if needed for heart rates.  - if fails rate control over the weekend would make NPO Sunday night for TEE/DCCV - if able to rate control would be ok for discharge over the weekend on oral av nodal agents, and reassess as outpatient if he stays in afib about possible DCCV. I suspect based on his  prior admission he likely require TEE/DCCV again this admission.         For questions or updates, please contact [facility] Please consult [email or web address] for contact info under    Signed, Dina Rich, MD  10/07/2023 4:31 PM

## 2023-10-07 NOTE — ED Triage Notes (Signed)
 Pt states he was at the [place] and they preceded to perform an EKG and noticed pt was in AFIB. Pt states he has been cardioverted in the passed. Pts heartrate changing rapidly from 30s to 140s in triage. Pt stated that he has been having difficulty breathing for the last two weeks and believes he has been in Afib since.

## 2023-10-08 ENCOUNTER — Inpatient Hospital Stay (HOSPITAL_COMMUNITY)

## 2023-10-08 DIAGNOSIS — I10 Essential (primary) hypertension: Secondary | ICD-10-CM | POA: Diagnosis not present

## 2023-10-08 DIAGNOSIS — I4891 Unspecified atrial fibrillation: Secondary | ICD-10-CM | POA: Diagnosis not present

## 2023-10-08 DIAGNOSIS — G4733 Obstructive sleep apnea (adult) (pediatric): Secondary | ICD-10-CM | POA: Diagnosis not present

## 2023-10-08 DIAGNOSIS — E1165 Type 2 diabetes mellitus with hyperglycemia: Secondary | ICD-10-CM | POA: Diagnosis not present

## 2023-10-08 LAB — COMPREHENSIVE METABOLIC PANEL
ALT: 43 U/L (ref 0–44)
AST: 22 U/L (ref 15–41)
Albumin: 3.3 g/dL — ABNORMAL LOW (ref 3.5–5.0)
Alkaline Phosphatase: 39 U/L (ref 38–126)
Anion gap: 9 (ref 5–15)
BUN: 14 mg/dL (ref 6–20)
CO2: 23 mmol/L (ref 22–32)
Calcium: 8.7 mg/dL — ABNORMAL LOW (ref 8.9–10.3)
Chloride: 105 mmol/L (ref 98–111)
Creatinine, Ser: 0.92 mg/dL (ref 0.61–1.24)
GFR, Estimated: >60 mL/min (ref >60)
Glucose, Bld: 199 mg/dL — ABNORMAL HIGH (ref 70–99)
Potassium: 4 mmol/L (ref 3.5–5.1)
Sodium: 137 mmol/L (ref 135–145)
Total Bilirubin: 0.7 mg/dL (ref 0.0–1.2)
Total Protein: 6.6 g/dL (ref 6.5–8.1)

## 2023-10-08 LAB — ECHOCARDIOGRAM COMPLETE
AR max vel: 4.77 cm2
AV Area VTI: 4.41 cm2
AV Area mean vel: 4.51 cm2
AV Mean grad: 1 mmHg
AV Peak grad: 1.9 mmHg
Ao pk vel: 0.69 m/s
Height: 71 in
S' Lateral: 3.5 cm
Weight: 4994.74 [oz_av]

## 2023-10-08 LAB — CBC WITH DIFFERENTIAL/PLATELET
Abs Immature Granulocytes: 0.08 10*3/uL — ABNORMAL HIGH (ref 0.00–0.07)
Basophils Absolute: 0 10*3/uL (ref 0.0–0.1)
Basophils Relative: 0 %
Eosinophils Absolute: 0.2 10*3/uL (ref 0.0–0.5)
Eosinophils Relative: 2 %
HCT: 41.5 % (ref 39.0–52.0)
Hemoglobin: 13.9 g/dL (ref 13.0–17.0)
Immature Granulocytes: 1 %
Lymphocytes Relative: 21 %
Lymphs Abs: 2.2 10*3/uL (ref 0.7–4.0)
MCH: 29.7 pg (ref 26.0–34.0)
MCHC: 33.5 g/dL (ref 30.0–36.0)
MCV: 88.7 fL (ref 80.0–100.0)
Monocytes Absolute: 1 10*3/uL (ref 0.1–1.0)
Monocytes Relative: 9 %
Neutro Abs: 6.8 10*3/uL (ref 1.7–7.7)
Neutrophils Relative %: 67 %
Platelets: 211 10*3/uL (ref 150–400)
RBC: 4.68 MIL/uL (ref 4.22–5.81)
RDW: 12.7 % (ref 11.5–15.5)
WBC: 10.2 10*3/uL (ref 4.0–10.5)
nRBC: 0 % (ref 0.0–0.2)

## 2023-10-08 LAB — MRSA NEXT GEN BY PCR, NASAL: MRSA by PCR Next Gen: NOT DETECTED

## 2023-10-08 LAB — TSH: TSH: 1.93 u[IU]/mL (ref 0.350–4.500)

## 2023-10-08 LAB — GLUCOSE, CAPILLARY
Glucose-Capillary: 211 mg/dL — ABNORMAL HIGH (ref 70–99)
Glucose-Capillary: 276 mg/dL — ABNORMAL HIGH (ref 70–99)
Glucose-Capillary: 266 mg/dL — ABNORMAL HIGH (ref 70–99)
Glucose-Capillary: 278 mg/dL — ABNORMAL HIGH (ref 70–99)
Glucose-Capillary: 316 mg/dL — ABNORMAL HIGH (ref 70–99)

## 2023-10-08 LAB — MAGNESIUM: Magnesium: 2.4 mg/dL (ref 1.7–2.4)

## 2023-10-08 LAB — BRAIN NATRIURETIC PEPTIDE: B Natriuretic Peptide: 173 pg/mL — ABNORMAL HIGH (ref 0.0–100.0)

## 2023-10-08 MED ORDER — INSULIN ASPART 100 UNIT/ML IJ SOLN
10.0000 [IU] | Freq: Three times a day (TID) | INTRAMUSCULAR | Status: DC
  Administered 2023-10-08 – 2023-10-09 (×2): 10 [IU] via SUBCUTANEOUS

## 2023-10-08 MED ORDER — PERFLUTREN LIPID MICROSPHERE
1.0000 mL | INTRAVENOUS | Status: AC | PRN
  Administered 2023-10-08: 4 mL via INTRAVENOUS

## 2023-10-08 MED ORDER — INSULIN GLARGINE-YFGN 100 UNIT/ML ~~LOC~~ SOLN
15.0000 [IU] | Freq: Every day | SUBCUTANEOUS | Status: DC
Start: 2023-10-08 — End: 2023-10-09
  Administered 2023-10-08: 15 [IU] via SUBCUTANEOUS
  Filled 2023-10-08 (×2): qty 0.15

## 2023-10-08 MED ORDER — INSULIN GLARGINE 100 UNIT/ML ~~LOC~~ SOLN
15.0000 [IU] | Freq: Every day | SUBCUTANEOUS | Status: DC
  Filled 2023-10-08 (×2): qty 0.15

## 2023-10-08 MED ORDER — METOPROLOL SUCCINATE ER 50 MG PO TB24
50.0000 mg | ORAL_TABLET | Freq: Every day | ORAL | Status: DC
Start: 2023-10-08 — End: 2023-10-09
  Administered 2023-10-08 – 2023-10-09 (×2): 50 mg via ORAL
  Filled 2023-10-08 (×2): qty 1

## 2023-10-08 MED ORDER — INSULIN ASPART 100 UNIT/ML IJ SOLN
6.0000 [IU] | Freq: Three times a day (TID) | INTRAMUSCULAR | Status: DC
  Administered 2023-10-08 (×2): 6 [IU] via SUBCUTANEOUS

## 2023-10-08 MED ORDER — DILTIAZEM HCL 30 MG PO TABS
60.0000 mg | ORAL_TABLET | Freq: Four times a day (QID) | ORAL | Status: DC
  Administered 2023-10-08 – 2023-10-09 (×5): 60 mg via ORAL
  Filled 2023-10-08: qty 1
  Filled 2023-10-08 (×4): qty 2

## 2023-10-08 NOTE — Progress Notes (Signed)
 PROGRESS NOTE   Kenneth Bridges  NWG:[id number] DOB: 1966-04-04 DOA: 10/07/2023 PCP: [facility]   Chief Complaint  Patient presents with   AFIB rvr   Level of care: Telemetry  Brief Admission History:  58 year old gentleman with history of hypertension, type 2 diabetes mellitus, OSA, persistent atrial fibrillation status post DCCV 02/25/2022, fully anticoagulated with rivaroxaban 20 mg daily, dilation of ascending thoracic aorta reports that he had been doing fairly well with control of his atrial fibrillation.  He reports that he missed a dose of rivaroxaban about 1 week ago (10/01/23) but has taken it consistently since then.  He reports that for the past couple of weeks he has had episodes of shortness of breath especially with exertion.  Earlier today he was seen at the [facility] for scheduled labs and CT scan and he could only walk a few feet before having to sit down.  He denies having any symptoms of chest pain.  He has been having palpitations intermittently.  He denies syncope.  He denies increasing edema in the extremities.  He had an EKG at the [place] today and it demonstrated A-fib with RVR.  He was sent to the emergency department for more evaluation.  He reports that he has had fluctuating heart rate patterns in the past 2 weeks from the low 30s up to 140-150.  In the ED he has been started on IV diltiazem infusion.  He reports that when he was initially diagnosed with A-fib he was treated medically for 1 to 2 days and subsequently had to have a cardioversion to get the rate controlled.  He is going to be seen by our cardiology service today and he will be admitted to the stepdown ICU on a continuous IV diltiazem infusion.   Assessment and Plan:  Atrial Fibrillation with RVR - was treated with IV diltiazem infusion 15 mg /hr with lopressor 25 mg every 6 hours - fortunately HR is much better controlled this morning consistently less than 85  - BPs tolerating infusion so far -  rivaroxaban for full anticoagulation  - repeat Echo today now that heart rates better managed - follow up TSH testing  - follow electrolytes  - transition off IV diltiazem infusion to oral cardizem 60 mg every 6 hours, with metoprolol XL 50 mg daily, hold parameters added; if HR controlled after stopping IV diltiazem infusion, plan to transfer to telemetry monitored bed and out of ICU setting.    Type 2 Diabetes mellitus, insulin requiring, uncontrolled  - as evidenced by A1c of 8.6%  - continue SSI coverage, with prandial, basal coverage - frequent CBG monitoring ordered  - titrated up the prandial coverage today;   CBG (last 3)  Recent Labs    10/07/23 2123 10/08/23 0301 10/08/23 0801  GLUCAP 330* 211* 276*    Essential  hypertension  - diltiazem oral tabs and oral metoprolol as noted above    OSA - will offer nightly CPAP while in hospital    DVT prophylaxis: rivaroxaban  Code Status: Full  Family Communication:  Disposition: transfer to tele when off IV cardizem infusion    Consultants:  cardio  Procedures:   Antimicrobials:    Subjective: Pt reports he is pleased to see his HR better controlled on IV diltiazem infusion.   Objective: Vitals:   10/08/23 0515 10/08/23 0530 10/08/23 0700 10/08/23 0802  BP: (!) 113/91 (!) 136/97 106/84 111/69  Pulse: (!) 45 92 64 72  Resp: (!) 21 (!) 23 18  16  Temp:    97.9 F (36.6 C)  TempSrc:    Axillary  SpO2: 91% 91% 90% 95%  Weight:      Height:        Intake/Output Summary (Last 24 hours) at 10/08/2023 1000 Last data filed at 10/08/2023 0338 Gross per 24 hour  Intake 198.37 ml  Output --  Net 198.37 ml   Filed Weights   10/07/23 1220 10/07/23 1751  Weight: (!) 141.1 kg (!) 141.6 kg   Examination:  General exam: Appears calm and comfortable sitting up in chair.  Respiratory system: Clear to auscultation. Respiratory effort normal. Cardiovascular system: irregularly irregular normal S1 & S2 heard. No JVD,  murmurs, rubs, gallops or clicks. No pedal edema. Gastrointestinal system: Abdomen is nondistended, soft and nontender. No organomegaly or masses felt. Normal bowel sounds heard. Central nervous system: Alert and oriented. No focal neurological deficits. Extremities: Symmetric 5 x 5 power. Skin: No rashes, lesions or ulcers. Psychiatry: Judgement and insight appear normal. Mood & affect appropriate.   Data Reviewed: I have personally reviewed following labs and imaging studies  CBC: Recent Labs  Lab 10/07/23 1302 10/08/23 0344  WBC 11.1* 10.2  NEUTROABS  --  6.8  HGB 14.7 13.9  HCT 43.2 41.5  MCV 89.1 88.7  PLT 213 211    Basic Metabolic Panel: Recent Labs  Lab 10/07/23 1302 10/08/23 0344  NA 134* 137  K 3.9 4.0  CL 103 105  CO2 22 23  GLUCOSE 207* 199*  BUN 14 14  CREATININE 1.01 0.92  CALCIUM 8.8* 8.7*  MG 1.8 2.4    CBG: Recent Labs  Lab 10/07/23 1631 10/07/23 2123 10/08/23 0301 10/08/23 0801  GLUCAP 207* 330* 211* 276*    Recent Results (from the past 240 hours)  MRSA Next Gen by PCR, Nasal     Status: None   Collection Time: 10/07/23  9:29 PM   Specimen: Nasal Mucosa; Nasal Swab  Result Value Ref Range Status   MRSA by PCR Next Gen NOT DETECTED NOT DETECTED Final    Comment: (NOTE) The GeneXpert MRSA Assay (FDA approved for NASAL specimens only), is one component of a comprehensive MRSA colonization surveillance program. It is not intended to diagnose MRSA infection nor to guide or monitor treatment for MRSA infections. Test performance is not FDA approved in patients less than 45 years old. Performed at [facility], [address] [place], [place] [address]      Radiology Studies: DG Chest Portable 1 View Result Date: 10/07/2023 CLINICAL DATA:  Dyspnea.  AFib. EXAM: PORTABLE CHEST 1 VIEW COMPARISON:  03/03/2022. FINDINGS: Bilateral lung fields are clear. Bilateral costophrenic angles are clear. Stable cardio-mediastinal silhouette. No acute  osseous abnormalities. The soft tissues are within normal limits. IMPRESSION: No active disease. Electronically Signed   By: Jules Schick M.D.   On: 10/07/2023 14:50   Scheduled Meds:  Chlorhexidine Gluconate Cloth  6 each Topical Q0600   diltiazem  60 mg Oral Q6H   insulin aspart  0-20 Units Subcutaneous TID WC   insulin aspart  0-5 Units Subcutaneous QHS   insulin aspart  6 Units Subcutaneous TID WC   insulin glargine-yfgn  15 Units Subcutaneous Daily   metoprolol succinate  50 mg Oral Daily   rivaroxaban  20 mg Oral Q supper   Continuous Infusions:   LOS: 1 day   Critical Care Procedure Note Authorized and Performed by: Maryln Manuel MD  Total Critical Care time:  50 mins Due  to a high probability of clinically significant, life threatening deterioration, the patient required my highest level of preparedness to intervene emergently and I personally spent this critical care time directly and personally managing the patient.  This critical care time included obtaining a history; examining the patient, pulse oximetry; ordering and review of studies; arranging urgent treatment with development of a management plan; evaluation of patient's response of treatment; frequent reassessment; and discussions with other providers.  This critical care time was performed to assess and manage the high probability of imminent and life threatening deterioration that could result in multi-organ failure.  It was exclusive of separately billable procedures and treating other patients and teaching time.   Standley Dakins, MD How to contact the [facility] Attending or Consulting provider 7A - 7P or covering provider during after hours 7P -7A, for this patient?  Check the care team in [facility] and look for a) attending/consulting [facility] provider listed and b) the [facility] team listed Log into [email or web address] to find provider on call.  Locate the [facility] provider you are looking for under [facility] and page to a number that you  can be directly reached. If you still have difficulty reaching the provider, please page the [facility] (Director on Call) for the Hospitalists listed on amion for assistance.  10/08/2023, 10:00 AM

## 2023-10-08 NOTE — Progress Notes (Signed)
   10/08/23 1843  TOC Brief Assessment  Insurance and Status Reviewed  Patient has primary care physician Yes  Home environment has been reviewed From home with spouse  Prior level of function: Independent  Prior/Current Home Services No current home services  Social Drivers of Health Review SDOH reviewed no interventions necessary  Readmission risk has been reviewed Yes [place] Si)  Transition of care needs no transition of care needs at this time    [place] notification done.

## 2023-10-08 NOTE — Progress Notes (Signed)
*  PRELIMINARY RESULTS* Echocardiogram 2D Echocardiogram has been performed.  Kenneth Bridges 10/08/2023, 9:33 AM

## 2023-10-08 NOTE — Progress Notes (Signed)
 CPAP in room patient didn't use.

## 2023-10-08 NOTE — Plan of Care (Signed)
   Problem: Education: Goal: Ability to describe self-care measures that may prevent or decrease complications (Diabetes Survival Skills Education) will improve Outcome: Progressing   Problem: Coping: Goal: Ability to adjust to condition or change in health will improve Outcome: Progressing   Problem: Fluid Volume: Goal: Ability to maintain a balanced intake and output will improve Outcome: Progressing   Problem: Skin Integrity: Goal: Risk for impaired skin integrity will decrease Outcome: Progressing

## 2023-10-08 NOTE — Progress Notes (Signed)
 Pt declined CPAP at this time. I told him to call if he changes his mind

## 2023-10-09 DIAGNOSIS — I4891 Unspecified atrial fibrillation: Secondary | ICD-10-CM

## 2023-10-09 DIAGNOSIS — I4819 Other persistent atrial fibrillation: Secondary | ICD-10-CM

## 2023-10-09 DIAGNOSIS — E1165 Type 2 diabetes mellitus with hyperglycemia: Secondary | ICD-10-CM

## 2023-10-09 DIAGNOSIS — Z794 Long term (current) use of insulin: Secondary | ICD-10-CM

## 2023-10-09 DIAGNOSIS — I10 Essential (primary) hypertension: Secondary | ICD-10-CM | POA: Diagnosis not present

## 2023-10-09 LAB — CBC WITH DIFFERENTIAL/PLATELET
Abs Immature Granulocytes: 0.08 10*3/uL — ABNORMAL HIGH (ref 0.00–0.07)
Basophils Absolute: 0.1 10*3/uL (ref 0.0–0.1)
Basophils Relative: 1 %
Eosinophils Absolute: 0.2 10*3/uL (ref 0.0–0.5)
Eosinophils Relative: 2 %
HCT: 41.2 % (ref 39.0–52.0)
Hemoglobin: 13.8 g/dL (ref 13.0–17.0)
Immature Granulocytes: 1 %
Lymphocytes Relative: 28 %
Lymphs Abs: 2.7 10*3/uL (ref 0.7–4.0)
MCH: 30.3 pg (ref 26.0–34.0)
MCHC: 33.5 g/dL (ref 30.0–36.0)
MCV: 90.4 fL (ref 80.0–100.0)
Monocytes Absolute: 1 10*3/uL (ref 0.1–1.0)
Monocytes Relative: 10 %
Neutro Abs: 5.7 10*3/uL (ref 1.7–7.7)
Neutrophils Relative %: 58 %
Platelets: 172 10*3/uL (ref 150–400)
RBC: 4.56 MIL/uL (ref 4.22–5.81)
RDW: 12.7 % (ref 11.5–15.5)
WBC: 9.7 10*3/uL (ref 4.0–10.5)
nRBC: 0 % (ref 0.0–0.2)

## 2023-10-09 LAB — COMPREHENSIVE METABOLIC PANEL
ALT: 34 U/L (ref 0–44)
AST: 17 U/L (ref 15–41)
Albumin: 3.1 g/dL — ABNORMAL LOW (ref 3.5–5.0)
Alkaline Phosphatase: 39 U/L (ref 38–126)
Anion gap: 7 (ref 5–15)
BUN: 12 mg/dL (ref 6–20)
CO2: 22 mmol/L (ref 22–32)
Calcium: 8.2 mg/dL — ABNORMAL LOW (ref 8.9–10.3)
Chloride: 104 mmol/L (ref 98–111)
Creatinine, Ser: 0.86 mg/dL (ref 0.61–1.24)
GFR, Estimated: >60 mL/min (ref >60)
Glucose, Bld: 113 mg/dL — ABNORMAL HIGH (ref 70–99)
Potassium: 3.6 mmol/L (ref 3.5–5.1)
Sodium: 133 mmol/L — ABNORMAL LOW (ref 135–145)
Total Bilirubin: 0.9 mg/dL (ref 0.0–1.2)
Total Protein: 6.4 g/dL — ABNORMAL LOW (ref 6.5–8.1)

## 2023-10-09 LAB — MAGNESIUM: Magnesium: 2.2 mg/dL (ref 1.7–2.4)

## 2023-10-09 LAB — GLUCOSE, CAPILLARY
Glucose-Capillary: 116 mg/dL — ABNORMAL HIGH (ref 70–99)
Glucose-Capillary: 181 mg/dL — ABNORMAL HIGH (ref 70–99)

## 2023-10-09 LAB — HIV ANTIBODY (ROUTINE TESTING W REFLEX): HIV Screen 4th Generation wRfx: NONREACTIVE

## 2023-10-09 MED ORDER — INSULIN GLARGINE 100 UNIT/ML ~~LOC~~ SOLN
20.0000 [IU] | Freq: Every day | SUBCUTANEOUS | Status: DC
  Filled 2023-10-09 (×2): qty 0.2

## 2023-10-09 MED ORDER — INSULIN GLARGINE-YFGN 100 UNIT/ML ~~LOC~~ SOLN
20.0000 [IU] | Freq: Every day | SUBCUTANEOUS | Status: DC
  Administered 2023-10-09: 20 [IU] via SUBCUTANEOUS
  Filled 2023-10-09 (×2): qty 0.2

## 2023-10-09 MED ORDER — DILTIAZEM HCL ER COATED BEADS 120 MG PO CP24
240.0000 mg | ORAL_CAPSULE | Freq: Every day | ORAL | Status: DC
  Administered 2023-10-09: 240 mg via ORAL
  Filled 2023-10-09: qty 2

## 2023-10-09 MED ORDER — DILTIAZEM HCL ER COATED BEADS 240 MG PO CP24: 240.0000 mg | ORAL_CAPSULE | Freq: Every day | ORAL | 1 refills | Status: DC

## 2023-10-09 MED ORDER — DILTIAZEM HCL ER COATED BEADS 120 MG PO CP24: 240.0000 mg | ORAL_CAPSULE | Freq: Every day | ORAL | Status: DC

## 2023-10-09 MED ORDER — METOPROLOL SUCCINATE ER 50 MG PO TB24: 50.0000 mg | ORAL_TABLET | Freq: Every day | ORAL | 1 refills | Status: DC

## 2023-10-09 NOTE — Discharge Summary (Signed)
 Physician Discharge Summary  Kenneth Bridges NGE:[id number] DOB: [birth year] DOA: 10/07/2023  PCP: [facility]  Admit date: 10/07/2023 Discharge date: 10/09/2023  Admitted From:  HOME  Disposition:  HOME    Recommendations for Outpatient Follow-up:  Follow up with [facility] [place] in 2 weeks Follow up with PCP in 1 week   Discharge Condition: STABLE   CODE STATUS: FULL  DIET: resume heart healthy/carb modified    Brief Hospitalization Summary: Please see all hospital notes, images, labs for full details of the hospitalization. Admission provider HPI:  58 year old gentleman with history of hypertension, type 2 diabetes mellitus, OSA, persistent atrial fibrillation status post DCCV 02/25/2022, fully anticoagulated with rivaroxaban 20 mg daily, dilation of ascending thoracic aorta reports that he had been doing fairly well with control of his atrial fibrillation.  He reports that he missed a dose of rivaroxaban about 1 week ago (10/01/23) but has taken it consistently since then.  He reports that for the past couple of weeks he has had episodes of shortness of breath especially with exertion.  Earlier today he was seen at the [facility] for scheduled labs and CT scan and he could only walk a few feet before having to sit down.  He denies having any symptoms of chest pain.  He has been having palpitations intermittently.  He denies syncope.  He denies increasing edema in the extremities.  He had an EKG at the [place] today and it demonstrated A-fib with RVR.  He was sent to the emergency department for more evaluation.  He reports that he has had fluctuating heart rate patterns in the past 2 weeks from the low 30s up to 140-150.  In the ED he has been started on IV diltiazem infusion.  He reports that when he was initially diagnosed with A-fib he was treated medically for 1 to 2 days and subsequently had to have a cardioversion to get the rate controlled.  He is going to be seen by our cardiology service  today and he will be admitted to the stepdown ICU on a continuous IV diltiazem infusion.  Hospital course by listed problems  Atrial Fibrillation with RVR - Controlled With Treatments - was treated with IV diltiazem infusion 15 mg /hr with lopressor 25 mg every 6 hours - fortunately HR is much better controlled and consistently less than 85  - BPs tolerating well - rivaroxaban for full anticoagulation  - repeat Echo technically difficult, LVEF 50-55%  - follow up TSH testing -- 1.930 - followed electrolytes and remained stable - transitioned off IV diltiazem infusion to oral cardizem 60 mg every 6 hours, with metoprolol XL 50 mg daily, hold parameters added; transferred to telemetry bed.  - transitioned to cardizem CD 240 mg daily and metoprolol XL 50 mg daily, rivaroxaban 20 mg daily  - pt is eager to go home, HRs well controlled.  DC home with close outpatient follow up with cardiology.     Type 2 Diabetes mellitus, insulin requiring, uncontrolled  - as evidenced by A1c of 8.6%  - treated with SSI coverage, with prandial, basal coverage in hospital  - frequent CBG monitoring ordered in hospital - resume home treatments at discharge and follow up with PCP strongly recommended CBG (last 3)  Recent Labs    10/08/23 2112 10/09/23 0352 10/09/23 0742  GLUCAP 316* 116* 181*   Essential  hypertension  - diltiazem oral tabs and oral metoprolol as noted above    OSA - will offer nightly CPAP while  in hospital   Discharge Diagnoses:  Principal Problem:   Atrial fibrillation with RVR (HCC) Active Problems:   Obstructive sleep apnea   Diabetes (HCC)   Essential hypertension   Obesity, Class III, BMI 40-49.9 (morbid obesity) (HCC)   Persistent atrial fibrillation [facility])   Discharge Instructions: Discharge Instructions     Ambulatory referral to Cardiology   Complete by: As directed    Hospital follow up      Allergies as of 10/09/2023   No Known Allergies      Medication  List     STOP taking these medications    atorvastatin 10 MG tablet Commonly known as: LIPITOR   hydrochlorothiazide 25 MG tablet Commonly known as: HYDRODIURIL   telmisartan 80 MG tablet Commonly known as: MICARDIS       TAKE these medications    blood glucose meter kit and supplies Kit Dispense based on patient and insurance preference. Use up to four times daily as directed.   diltiazem 240 MG 24 hr capsule Commonly known as: CARDIZEM CD Take 1 capsule (240 mg total) by mouth daily. Start taking on: October 10, 2023   HumaLOG 100 UNIT/ML cartridge Generic drug: insulin lispro Inject 0.04 mLs (4 Units total) into the skin 3 (three) times daily with meals.   insulin glargine-yfgn 100 UNIT/ML Pen Commonly known as: SEMGLEE INJECT 24 UNITS SUBCUTANEOUSLY EVERY MORNING (USE WITHIN 28 DAYS AFTER OPENING PEN)   INSULIN SYRINGE .5CC/31GX5/16" 31G X 5/16" 0.5 ML Misc Take as directed   metFORMIN 750 MG 24 hr tablet Commonly known as: GLUCOPHAGE-XR Take 750 mg by mouth daily with breakfast.   metoprolol succinate 50 MG 24 hr tablet Commonly known as: Toprol XL Take 1 tablet (50 mg total) by mouth daily. Start taking on: October 10, 2023 What changed:  medication strength how much to take   rivaroxaban 20 MG Tabs tablet Commonly known as: Xarelto Take 1 tablet (20 mg total) by mouth daily with supper.   sildenafil 100 MG tablet Commonly known as: VIAGRA Take by mouth as needed.   spironolactone 25 MG tablet Commonly known as: ALDACTONE Take 12.5 mg by mouth daily.        Follow-up Information     Wendall Stade, MD. Schedule an appointment as soon as possible for a visit in 2 week(s).   Specialty: Cardiology Why: Hospital Follow Up Contact information: [address] [place] [address] [phone]         [facility].   Contact information: [address] [facility] [place] [address] [phone]                 No Known Allergies Allergies as of 10/09/2023   No Known Allergies      Medication List     STOP taking these medications    atorvastatin 10 MG tablet Commonly known as: LIPITOR   hydrochlorothiazide 25 MG tablet Commonly known as: HYDRODIURIL   telmisartan 80 MG tablet Commonly known as: MICARDIS       TAKE these medications    blood glucose meter kit and supplies Kit Dispense based on patient and insurance preference. Use up to four times daily as directed.   diltiazem 240 MG 24 hr capsule Commonly known as: CARDIZEM CD Take 1 capsule (240 mg total) by mouth daily. Start taking on: October 10, 2023   HumaLOG 100 UNIT/ML cartridge Generic drug: insulin lispro Inject 0.04 mLs (4 Units total) into the skin 3 (three) times  daily with meals.   insulin glargine-yfgn 100 UNIT/ML Pen Commonly known as: SEMGLEE INJECT 24 UNITS SUBCUTANEOUSLY EVERY MORNING (USE WITHIN 28 DAYS AFTER OPENING PEN)   INSULIN SYRINGE .5CC/31GX5/16" 31G X 5/16" 0.5 ML Misc Take as directed   metFORMIN 750 MG 24 hr tablet Commonly known as: GLUCOPHAGE-XR Take 750 mg by mouth daily with breakfast.   metoprolol succinate 50 MG 24 hr tablet Commonly known as: Toprol XL Take 1 tablet (50 mg total) by mouth daily. Start taking on: October 10, 2023 What changed:  medication strength how much to take   rivaroxaban 20 MG Tabs tablet Commonly known as: Xarelto Take 1 tablet (20 mg total) by mouth daily with supper.   sildenafil 100 MG tablet Commonly known as: VIAGRA Take by mouth as needed.   spironolactone 25 MG tablet Commonly known as: ALDACTONE Take 12.5 mg by mouth daily.        Procedures/Studies: ECHOCARDIOGRAM COMPLETE Result Date: 10/08/2023    ECHOCARDIOGRAM REPORT   Patient Name:   Kenneth Bridges Date of Exam: 10/08/2023 Medical Rec #:  [id number]           Height:       71.0 in Accession #:    [id number]          Weight:       312.2 lb Date of Birth:  [birth year]           BSA:          2.548 m Patient Age:    57 years            BP:           111/69 mmHg Patient Gender: M                   HR:           95 bpm. Exam Location:  Inpatient Procedure: 2D Echo and Intracardiac Opacification Agent (Both Spectral and Color            Flow Doppler were utilized during procedure). Indications:    A-Fib with RVR  History:        Patient has prior history of Echocardiogram examinations, most                 recent 11/03/2022. Arrythmias:Atrial Fibrillation; Risk                 Factors:Hypertension, Diabetes, Non-Smoker and Sleep Apnea.  Sonographer:    Dondra Prader RVT RCS Referring Phys: [id number] [facility] Cyndie Mull  Sonographer Comments: Technically challenging study due to limited acoustic windows, Technically difficult study due to poor echo windows, patient is obese, suboptimal parasternal window, suboptimal apical window and suboptimal subcostal window. Image acquisition challenging due to patient body habitus. IMPRESSIONS  1. Techincally difficult study, even with contrast. Left ventricular ejection fraction, by estimation, is 50 to 55%. The left ventricle has low normal function. Left ventricular endocardial border not optimally defined to evaluate regional wall motion. There is mild left ventricular hypertrophy. Left ventricular diastolic parameters are indeterminate.  2. Right ventricular systolic function is mildly reduced. The right ventricular size is mildly enlarged. There is normal pulmonary artery systolic pressure.  3. Left atrial size was mildly dilated.  4. The mitral valve is normal in structure. Trivial mitral valve regurgitation.  5. The aortic valve is tricuspid. Aortic valve regurgitation is not visualized. No aortic stenosis is present.  6. Aortic dilatation noted. There is dilatation of the ascending  aorta, measuring 41 mm.  7. The inferior vena cava is dilated in size with <50% respiratory variability, suggesting right atrial pressure of 15 mmHg. FINDINGS  Left  Ventricle: Left ventricular ejection fraction, by estimation, is 50 to 55%. The left ventricle has low normal function. Left ventricular endocardial border not optimally defined to evaluate regional wall motion. Strain imaging was not performed. The left ventricular internal cavity size was normal in size. There is mild left ventricular hypertrophy. Left ventricular diastolic parameters are indeterminate. Right Ventricle: The right ventricular size is mildly enlarged. No increase in right ventricular wall thickness. Right ventricular systolic function is mildly reduced. There is normal pulmonary artery systolic pressure. The tricuspid regurgitant velocity  is 2.08 m/s, and with an assumed right atrial pressure of 15 mmHg, the estimated right ventricular systolic pressure is 32.3 mmHg. Left Atrium: Left atrial size was mildly dilated. Right Atrium: Right atrial size was normal in size. Pericardium: Trivial pericardial effusion is present. Mitral Valve: The mitral valve is normal in structure. Trivial mitral valve regurgitation. Tricuspid Valve: The tricuspid valve is normal in structure. Tricuspid valve regurgitation is trivial. Aortic Valve: The aortic valve is tricuspid. Aortic valve regurgitation is not visualized. No aortic stenosis is present. Aortic valve mean gradient measures 1.0 mmHg. Aortic valve peak gradient measures 1.9 mmHg. Aortic valve area, by VTI measures 4.41 cm. Pulmonic Valve: The pulmonic valve was not well visualized. Pulmonic valve regurgitation is not visualized. Aorta: Aortic dilatation noted. There is dilatation of the ascending aorta, measuring 41 mm. Venous: The inferior vena cava is dilated in size with less than 50% respiratory variability, suggesting right atrial pressure of 15 mmHg. IAS/Shunts: The interatrial septum was not well visualized. Additional Comments: 3D imaging was not performed.  LEFT VENTRICLE PLAX 2D LVIDd:         4.90 cm LVIDs:         3.50 cm LV PW:         1.30 cm  LV IVS:        1.40 cm LVOT diam:     2.20 cm LV SV:         55 LV SV Index:   22 LVOT Area:     3.80 cm  RIGHT VENTRICLE            IVC RV S prime:     8.77 cm/s  IVC diam: 3.00 cm TAPSE (M-mode): 1.8 cm LEFT ATRIUM              Index        RIGHT ATRIUM           Index LA diam:        4.20 cm  1.65 cm/m   RA Area:     18.70 cm LA Vol (A2C):   99.5 ml  39.05 ml/m  RA Volume:   55.00 ml  21.59 ml/m LA Vol (A4C):   75.0 ml  29.44 ml/m LA Biplane Vol: 101.0 ml 39.64 ml/m  AORTIC VALVE                    PULMONIC VALVE AV Area (Vmax):    4.77 cm     PV Vmax:       0.35 m/s AV Area (Vmean):   4.51 cm     PV Peak grad:  0.5 mmHg AV Area (VTI):     4.41 cm AV Vmax:           68.80 cm/s AV  Vmean:          46.220 cm/s AV VTI:            0.126 m AV Peak Grad:      1.9 mmHg AV Mean Grad:      1.0 mmHg LVOT Vmax:         86.42 cm/s LVOT Vmean:        54.820 cm/s LVOT VTI:          0.146 m LVOT/AV VTI ratio: 1.16  AORTA Ao Root diam: 3.50 cm Ao Asc diam:  4.10 cm TRICUSPID VALVE TR Peak grad:   17.3 mmHg TR Vmax:        208.00 cm/s  SHUNTS Systemic VTI:  0.15 m Systemic Diam: 2.20 cm Epifanio Lesches MD Electronically signed by Epifanio Lesches MD Signature Date/Time: 10/08/2023/2:38:03 PM    Final    DG Chest Portable 1 View Result Date: 10/07/2023 CLINICAL DATA:  Dyspnea.  AFib. EXAM: PORTABLE CHEST 1 VIEW COMPARISON:  03/03/2022. FINDINGS: Bilateral lung fields are clear. Bilateral costophrenic angles are clear. Stable cardio-mediastinal silhouette. No acute osseous abnormalities. The soft tissues are within normal limits. IMPRESSION: No active disease. Electronically Signed   By: Jules Schick M.D.   On: 10/07/2023 14:50     Subjective: Pt says he is feeling well and he is eager to go home today.  He says he has no palpitations, no CP and no SOB.    Discharge Exam: Vitals:   10/09/23 0351 10/09/23 0504  BP: 113/69 116/67  Pulse: 69 94  Resp: 16   Temp: 97.6 F (36.4 C)   SpO2: 96%     Vitals:   10/08/23 2313 10/08/23 2345 10/09/23 0351 10/09/23 0504  BP: 119/69 104/68 113/69 116/67  Pulse: 88 94 69 94  Resp: 16 19 16    Temp: 97.8 F (36.6 C) 97.8 F (36.6 C) 97.6 F (36.4 C)   TempSrc: Oral Oral Oral   SpO2: 97% 96% 96%   Weight:      Height:       General: Pt is alert, awake, not in acute distress Cardiovascular: irregularly irregular, S1/S2 +, no rubs, no gallops Respiratory: CTA bilaterally, no wheezing, no rhonchi Abdominal: Soft, NT, ND, bowel sounds + Extremities: trace bilateral LE edema, no cyanosis   The results of significant diagnostics from this hospitalization (including imaging, microbiology, ancillary and laboratory) are listed below for reference.    Microbiology: Recent Results (from the past 240 hours)  MRSA Next Gen by PCR, Nasal     Status: None   Collection Time: 10/07/23  9:29 PM   Specimen: Nasal Mucosa; Nasal Swab  Result Value Ref Range Status   MRSA by PCR Next Gen NOT DETECTED NOT DETECTED Final    Comment: (NOTE) The GeneXpert MRSA Assay (FDA approved for NASAL specimens only), is one component of a comprehensive MRSA colonization surveillance program. It is not intended to diagnose MRSA infection nor to guide or monitor treatment for MRSA infections. Test performance is not FDA approved in patients less than 37 years old. Performed at [facility], [address], [place], [place] [address]      Labs: BNP (last 3 results) Recent Labs    11/03/22 0802 10/07/23 1302 10/08/23 0344  BNP 40.0 345.0* 173.0*   Basic Metabolic Panel: Recent Labs  Lab 10/07/23 1302 10/08/23 0344 10/09/23 0329  NA 134* 137 133*  K 3.9 4.0 3.6  CL 103 105 104  CO2 22 23 22  GLUCOSE 207* 199* 113*  BUN 14 14 12   CREATININE 1.01 0.92 0.86  CALCIUM 8.8* 8.7* 8.2*  MG 1.8 2.4 2.2   Liver Function Tests: Recent Labs  Lab 10/08/23 0344 10/09/23 0329  AST 22 17  ALT 43 34  ALKPHOS 39 39  BILITOT 0.7 0.9  PROT 6.6 6.4*  ALBUMIN  3.3* 3.1*   No results for input(s): "LIPASE", "AMYLASE" in the last 168 hours. No results for input(s): "AMMONIA" in the last 168 hours. CBC: Recent Labs  Lab 10/07/23 1302 10/08/23 0344 10/09/23 0329  WBC 11.1* 10.2 9.7  NEUTROABS  --  6.8 5.7  HGB 14.7 13.9 13.8  HCT 43.2 41.5 41.2  MCV 89.1 88.7 90.4  PLT 213 211 172   Cardiac Enzymes: No results for input(s): "CKTOTAL", "CKMB", "CKMBINDEX", "TROPONINI" in the last 168 hours. BNP: Invalid input(s): "POCBNP" CBG: Recent Labs  Lab 10/08/23 1136 10/08/23 1612 10/08/23 2112 10/09/23 0352 10/09/23 0742  GLUCAP 266* 278* 316* 116* 181*   D-Dimer No results for input(s): "DDIMER" in the last 72 hours. Hgb A1c Recent Labs    10/07/23 1302  HGBA1C 8.6*   Lipid Profile No results for input(s): "CHOL", "HDL", "LDLCALC", "TRIG", "CHOLHDL", "LDLDIRECT" in the last 72 hours. Thyroid function studies Recent Labs    10/08/23 0337  TSH 1.930   Anemia work up No results for input(s): "VITAMINB12", "FOLATE", "FERRITIN", "TIBC", "IRON", "RETICCTPCT" in the last 72 hours. Urinalysis    Component Value Date/Time   COLORURINE YELLOW 05/22/2016 1001   APPEARANCEUR CLEAR 05/22/2016 1001   LABSPEC 1.010 05/22/2016 1001   PHURINE 7.0 05/22/2016 1001   GLUCOSEU NEGATIVE 05/22/2016 1001   HGBUR NEGATIVE 05/22/2016 1001   BILIRUBINUR NEGATIVE 05/22/2016 1001   KETONESUR 40 (A) 05/22/2016 1001   PROTEINUR NEGATIVE 05/22/2016 1001   NITRITE NEGATIVE 05/22/2016 1001   LEUKOCYTESUR NEGATIVE 05/22/2016 1001   Sepsis Labs Recent Labs  Lab 10/07/23 1302 10/08/23 0344 10/09/23 0329  WBC 11.1* 10.2 9.7   Microbiology Recent Results (from the past 240 hours)  MRSA Next Gen by PCR, Nasal     Status: None   Collection Time: 10/07/23  9:29 PM   Specimen: Nasal Mucosa; Nasal Swab  Result Value Ref Range Status   MRSA by PCR Next Gen NOT DETECTED NOT DETECTED Final    Comment: (NOTE) The GeneXpert MRSA Assay (FDA approved for  NASAL specimens only), is one component of a comprehensive MRSA colonization surveillance program. It is not intended to diagnose MRSA infection nor to guide or monitor treatment for MRSA infections. Test performance is not FDA approved in patients less than 85 years old. Performed at [facility], [address], [place], [place] [address]    Time coordinating discharge:  39 mins   SIGNED:  Standley Dakins, MD  [facility] 10/09/2023, 10:31 AM How to contact the [facility] Attending or Consulting provider 7A - 7P or covering provider during after hours 7P -7A, for this patient?  Check the care team in [facility] and look for a) attending/consulting [facility] provider listed and b) the [facility] team listed Log into [email or web address] and use [place]'s universal password to access. If you do not have the password, please contact the hospital operator. Locate the [facility] provider you are looking for under [facility] and page to a number that you can be directly reached. If you still have difficulty reaching the provider, please page the [facility] (Director on Call) for the Hospitalists listed on amion for assistance.

## 2023-10-09 NOTE — Progress Notes (Signed)
 Nsg Discharge Note  Admit Date:  10/07/2023 Discharge date: 10/09/2023   Kenneth Bridges to be D/C'd Home per MD order.  AVS completed.   Patient/caregiver able to verbalize understanding.  Discharge Medication: Allergies as of 10/09/2023   No Known Allergies      Medication List     STOP taking these medications    atorvastatin 10 MG tablet Commonly known as: LIPITOR   hydrochlorothiazide 25 MG tablet Commonly known as: HYDRODIURIL   telmisartan 80 MG tablet Commonly known as: MICARDIS       TAKE these medications    blood glucose meter kit and supplies Kit Dispense based on patient and insurance preference. Use up to four times daily as directed.   diltiazem 240 MG 24 hr capsule Commonly known as: CARDIZEM CD Take 1 capsule (240 mg total) by mouth daily. Start taking on: October 10, 2023   HumaLOG 100 UNIT/ML cartridge Generic drug: insulin lispro Inject 0.04 mLs (4 Units total) into the skin 3 (three) times daily with meals.   insulin glargine-yfgn 100 UNIT/ML Pen Commonly known as: SEMGLEE INJECT 24 UNITS SUBCUTANEOUSLY EVERY MORNING (USE WITHIN 28 DAYS AFTER OPENING PEN)   INSULIN SYRINGE .5CC/31GX5/16" 31G X 5/16" 0.5 ML Misc Take as directed   metFORMIN 750 MG 24 hr tablet Commonly known as: GLUCOPHAGE-XR Take 750 mg by mouth daily with breakfast.   metoprolol succinate 50 MG 24 hr tablet Commonly known as: Toprol XL Take 1 tablet (50 mg total) by mouth daily. Start taking on: October 10, 2023 What changed:  medication strength how much to take   rivaroxaban 20 MG Tabs tablet Commonly known as: Xarelto Take 1 tablet (20 mg total) by mouth daily with supper.   sildenafil 100 MG tablet Commonly known as: VIAGRA Take by mouth as needed.   spironolactone 25 MG tablet Commonly known as: ALDACTONE Take 12.5 mg by mouth daily.        Discharge Assessment: Vitals:   10/09/23 0504 10/09/23 1043  BP: 116/67 (!) 124/96  Pulse: 94   Resp:     Temp:    SpO2:     Skin clean, dry and intact without evidence of skin break down, no evidence of skin tears noted. IV catheter discontinued intact. Site without signs and symptoms of complications - no redness or edema noted at insertion site, patient denies c/o pain - only slight tenderness at site.  Dressing with slight pressure applied.  D/c Instructions-Education: Discharge instructions given to patient/family with verbalized understanding. D/c education completed with patient/family including follow up instructions, medication list, d/c activities limitations if indicated, with other d/c instructions as indicated by MD - patient able to verbalize understanding, all questions fully answered. Patient instructed to return to ED, call 911, or call MD for any changes in condition.  Patient escorted via WC, and D/C home via private auto.  Laurena Spies, RN 10/09/2023 10:49 AM

## 2023-10-09 NOTE — Discharge Instructions (Signed)
IMPORTANT INFORMATION: PAY CLOSE ATTENTION   PHYSICIAN DISCHARGE INSTRUCTIONS  Follow with Primary care provider  [facility]  and other consultants as instructed by your Hospitalist Physician  SEEK MEDICAL CARE OR RETURN TO EMERGENCY ROOM IF SYMPTOMS COME BACK, WORSEN OR NEW PROBLEM DEVELOPS   Please note: You were cared for by a hospitalist during your hospital stay. Every effort will be made to forward records to your primary care provider.  You can request that your primary care provider send for your hospital records if they have not received them.  Once you are discharged, your primary care physician will handle any further medical issues. Please note that NO REFILLS for any discharge medications will be authorized once you are discharged, as it is imperative that you return to your primary care physician (or establish a relationship with a primary care physician if you do not have one) for your post hospital discharge needs so that they can reassess your need for medications and monitor your lab values.  Please get a complete blood count and chemistry panel checked by your Primary MD at your next visit, and again as instructed by your Primary MD.  Get Medicines reviewed and adjusted: Please take all your medications with you for your next visit with your Primary MD  Laboratory/radiological data: Please request your Primary MD to go over all hospital tests and procedure/radiological results at the follow up, please ask your primary care provider to get all Hospital records sent to his/her office.  In some cases, they will be blood work, cultures and biopsy results pending at the time of your discharge. Please request that your primary care provider follow up on these results.  If you are diabetic, please bring your blood sugar readings with you to your follow up appointment with primary care.    Please call and make your follow up appointments as soon as possible.    Also  Note the following: If you experience worsening of your admission symptoms, develop shortness of breath, life threatening emergency, suicidal or homicidal thoughts you must seek medical attention immediately by calling 911 or calling your MD immediately  if symptoms less severe.  You must read complete instructions/literature along with all the possible adverse reactions/side effects for all the Medicines you take and that have been prescribed to you. Take any new Medicines after you have completely understood and accpet all the possible adverse reactions/side effects.   Do not drive when taking Pain medications or sleeping medications (Benzodiazepines)  Do not take more than prescribed Pain, Sleep and Anxiety Medications. It is not advisable to combine anxiety,sleep and pain medications without talking with your primary care practitioner  Special Instructions: If you have smoked or chewed Tobacco  in the last 2 yrs please stop smoking, stop any regular Alcohol  and or any Recreational drug use.  Wear Seat belts while driving.  Do not drive if taking any narcotic, mind altering or controlled substances or recreational drugs or alcohol.       

## 2023-10-09 NOTE — Plan of Care (Signed)

## 2023-10-10 NOTE — Progress Notes (Unsigned)
 Cardiology Office Note    Date:  10/13/2023   ID:  Kenneth Bridges, DOB 1966-03-10, MRN [id number]  PCP:  [facility]  Cardiologist: Charlton Haws, MD    No chief complaint on file.   History of Present Illness:    Kenneth Bridges is a 58 y.o. male with past medical history of HTN, Type II DM and OSA who presents to the office today for follow-up.  He was admitted to [facility] 03/03/22  for new-onset atrial fibrillation with RVR and was initially started on IV Cardizem. Echocardiogram did show his EF was mildly reduced at 50 to 55% with apical hypokinesis and mildly dilated left atria. Was also noted to have dilatation of the aortic root measuring 45 mm and dilatation of the ascending aorta at 40 mm. He ultimately required TEE/DCCV which was performed on 03/05/2022 with conversion to NSR with a 200 J biphasic shock. Seen by PA on d/c 03/23/22 and beta blocker d/c diuretics lowered   He has been using his CPAP at night. Also reduced his alcohol use. No  Missed doses of eliquis CHADVASC 3  Could not tolerate Ozempic with stomach upset and no longer on it   CTA 07/16/23 showed stable 4.3 cm ascending thoracic aneurysm.   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 Knee/ankle pain with prolonged standing   Had recurrent afib with hospitalization in [place] 2.28.25 TTE with EF 50-55% d/c on Toprol 50 mg and cardizem 240 mg with xarelto 20 mg He was not cardioverted and sent home in rate controlled afib.   ECG today afib rate 117 nonspecific ST changes   Long discussion about need for weight loss, compliance with meds and better BS control Still with edema and exertional dyspnea with poor rate control of his afib today  Discussed arranging DCC in 2 weeks since he has been compliant with his eliquis since d/c   Past Medical History:  Diagnosis Date   Atrial fibrillation (HCC)    a. s/p DCCV in 02/2022    Diabetes mellitus without complication (HCC)    Hypertension    Nasal septal deviation    with turbinate hypertrophy ( bilateral)   Sleep apnea    Wears glasses     Past Surgical History:  Procedure Laterality Date   ANKLE FRACTURE SURGERY Left 1988   CARDIOVERSION N/A 03/05/2022   Procedure: CARDIOVERSION;  Surgeon: Christell Constant, MD;  Location: [facility] ORS;  Service: Cardiovascular;  Laterality: N/A;   COLONOSCOPY N/A 05/19/2016   Procedure: COLONOSCOPY;  Surgeon: Corbin Ade, MD;  Location: [facility];  Service: Endoscopy;  Laterality: N/A;  9:30 am   HERNIA REPAIR Left    inguinal   KNEE ARTHROSCOPY WITH MEDIAL MENISECTOMY Right 08/28/2019   Procedure: KNEE ARTHROSCOPY WITH MEDIAL MENISCECTOMY;  Surgeon: Vickki Hearing, MD;  Location: [facility];  Service: Orthopedics;  Laterality: Right;   NASAL SEPTOPLASTY W/ TURBINOPLASTY  02/13/2016   NASAL SEPTOPLASTY WITH TURBINATE REDUCTION (Bilateral)   NASAL SEPTOPLASTY W/ TURBINOPLASTY Bilateral 02/13/2016   Procedure: NASAL SEPTOPLASTY WITH TURBINATE REDUCTION;  Surgeon: Christia Reading, MD;  Location: [facility];  Service: ENT;  Laterality: Bilateral;   SYNDESMOSIS REPAIR Left 11/07/2020   Procedure: SYNDESMOSIS REPAIR AND MEDIAL DELTOID REPAIR LEFT ANKLE;  Surgeon: Vickki Hearing, MD;  Location: [facility];  Service: Orthopedics;  Laterality: Left;   TEE WITHOUT CARDIOVERSION N/A 03/05/2022  Procedure: TRANSESOPHAGEAL ECHOCARDIOGRAM (TEE);  Surgeon: Christell Constant, MD;  Location: [facility];  Service: Cardiovascular;  Laterality: N/A;    Current Medications: Outpatient Medications Prior to Visit  Medication Sig Dispense Refill   blood glucose meter kit and supplies KIT Dispense based on patient and insurance preference. Use up to four times daily as directed. 1 each 0   diltiazem (CARDIZEM CD) 240 MG 24 hr capsule Take 1 capsule (240 mg total) by mouth daily. 30 capsule 1   insulin glargine-yfgn (SEMGLEE) 100 UNIT/ML Pen INJECT  24 UNITS SUBCUTANEOUSLY EVERY MORNING (USE WITHIN 28 DAYS AFTER OPENING PEN)     insulin lispro (HUMALOG) 100 UNIT/ML cartridge Inject 0.04 mLs (4 Units total) into the skin 3 (three) times daily with meals. 15 mL 11   Insulin Syringe-Needle U-100 (INSULIN SYRINGE .5CC/31GX5/16") 31G X 5/16" 0.5 ML MISC Take as directed 100 each 3   metFORMIN (GLUCOPHAGE-XR) 750 MG 24 hr tablet Take 750 mg by mouth daily with breakfast.     rivaroxaban (XARELTO) 20 MG TABS tablet Take 1 tablet (20 mg total) by mouth daily with supper. 90 tablet 3   sildenafil (VIAGRA) 100 MG tablet Take by mouth as needed.     metoprolol succinate (TOPROL XL) 50 MG 24 hr tablet Take 1 tablet (50 mg total) by mouth daily. 30 tablet 1   spironolactone (ALDACTONE) 25 MG tablet Take 12.5 mg by mouth daily.     No facility-administered medications prior to visit.     Allergies:   Patient has no known allergies.   Social History   Socioeconomic History   Marital status: Married    Spouse name: Not on file   Number of children: Not on file   Years of education: Not on file   Highest education level: Not on file  Occupational History   Not on file  Tobacco Use   Smoking status: Never   Smokeless tobacco: Never  Vaping Use   Vaping status: Never Used  Substance and Sexual Activity   Alcohol use: Not Currently    Comment: social beer drinker on the weekends (drinks 2 beers on Friday and 2 beers on Saturday)    Drug use: No   Sexual activity: Not on file  Other Topics Concern   Not on file  Social History Narrative   Not on file   Social Drivers of Health   Financial Resource Strain: Not on file  Food Insecurity: No Food Insecurity (10/07/2023)   Hunger Vital Sign    Worried About Running Out of Food in the Last Year: Never true    Ran Out of Food in the Last Year: Never true  Transportation Needs: No Transportation Needs (10/07/2023)   PRAPARE - Administrator, Civil Service (Medical): No    Lack of  Transportation (Non-Medical): No  Physical Activity: Not on file  Stress: Not on file  Social Connections: Unknown (09/27/2022)   Received from [facility]   Social Network    Social Network: Not on file     Family History:  The patient's family history includes Cancer in his mother.   Review of Systems:    Please see the history of present illness.     All other systems reviewed and are otherwise negative except as noted above.   Physical Exam:    VS:  BP (!) 126/96   Pulse (!) 117   Ht 5\' 10"  (1.778 m)   Wt (!) 317 lb  3.2 oz (143.9 kg)   SpO2 94%   BMI 45.51 kg/m    General: Well developed, well nourished,male appearing in no acute distress. Head: Normocephalic, atraumatic. Neck: No carotid bruits. JVD not elevated.  Lungs: Respirations regular and unlabored, without wheezes or rales.  Heart: Regular rate and rhythm. No S3 or S4.  No murmur, no rubs, or gallops appreciated. Abdomen: Appears non-distended. No obvious abdominal masses. Msk:  Strength and tone appear normal for age. No obvious joint deformities or effusions. Extremities: No clubbing or cyanosis. No pitting edema.  Distal pedal pulses are 2+ bilaterally. Neuro: Alert and oriented X 3. Moves all extremities spontaneously. No focal deficits noted. Psych:  Responds to questions appropriately with a normal affect. Skin: No rashes or lesions noted  Wt Readings from Last 3 Encounters:  10/13/23 (!) 317 lb 3.2 oz (143.9 kg)  10/07/23 (!) 312 lb 2.7 oz (141.6 kg)  08/12/23 (!) 306 lb (138.8 kg)     Studies/Labs Reviewed:   EKG:  EKG is ordered today.  The ekg ordered today demonstrates NSR, HR 69 with RAD and incomplete RBBB.   Recent Labs: 10/08/2023: B Natriuretic Peptide 173.0; TSH 1.930 10/09/2023: ALT 34; BUN 12; Creatinine, Ser 0.86; Hemoglobin 13.8; Magnesium 2.2; Platelets 172; Potassium 3.6; Sodium 133   Lipid Panel No results found for: "CHOL", "TRIG", "HDL", "CHOLHDL", "VLDL",  "LDLCALC", "LDLDIRECT"  Additional studies/ records that were reviewed today include:   Echocardiogram: 03/04/2022 IMPRESSIONS     1. Left ventricular ejection fraction, by estimation, is 50 to 55%. The  left ventricle has low normal function. The left ventricle demonstrates  regional wall motion abnormalities. Apical hypokinesis. There is mild left  ventricular hypertrophy. Left  ventricular diastolic parameters are indeterminate.   2. Right ventricular systolic function is normal. The right ventricular  size is normal.   3. Left atrial size was mildly dilated.   4. Right atrial size was mildly dilated.   5. The mitral valve is normal in structure. Trivial mitral valve  regurgitation. No evidence of mitral stenosis.   6. The aortic valve was not well visualized. Aortic valve regurgitation  is not visualized. No aortic stenosis is present.   7. Aortic dilatation noted. There is dilatation of the aortic root,  measuring 45 mm. There is dilatation of the ascending aorta, measuring 40  mm.   8. The inferior vena cava is dilated in size with >50% respiratory  variability, suggesting right atrial pressure of 8 mmHg.    Plan:   In order of problems listed above:  1. Persistent Atrial Fibrillation - He underwent successful TEE/DCCV on 03/05/2022 with conversion to NSR with a 200 J biphasic shock. [place] VASC 3 for low EF, HTN and aortic plaque on TEE Recurrence 10/07/23 not DCC due to missed doses of xarelto. TTE with similar EF 50-55% Increase Toprol to 100 mg for rate control DCC in 2 weeks will do at [facility] and if not successful will need to be hospitalized for AAT Lab called orders written Scheduled with Dr Jens Som for 10/27/23   2. Aortic Root Dilatation - Stable 4.3 cm aneurysm by CTA 07/16/23   3. HTN - [facility]  on [facility]ardis , Toprol and diuretic      4. Type 2 DM - Hgb A1c was elevated to 11.0 during his recent admission. He has been taking Metformin along with Insulin.  Ozempic d/c Trying to do low carb diet  Speak with primary about Wegovy or other GLP-1  due to intolerance to Ozempic   5. CHF:  he has weight gain, edema and dyspnea likely related to rapid afib. Increase aldactone to 25 mg add PRN lasix Check labs today  BMET/CBC/BNP Increase Toprol to 100 mg Increase aldactone to 25 mg Add PRN Lasix 20 mg Speak with primary about alternative to Ozempic    F/U post [facility]  Medication Adjustments/Labs and Tests Ordered: Current medicines are reviewed at length with the patient today.  Concerns regarding medicines are outlined above.  Medication changes, Labs and Tests ordered today are listed in the Patient Instructions below. Patient Instructions  Medication Instructions:  Your physician recommends that you continue on your current medications as directed. Please refer to the Current Medication list given to you today.  Take Lasix 20 mg daily as needed  Increase Toprol XL to 100 mg Daily  Increase Aldactone to 25 mg Daily   *If you need a refill on your cardiac medications before your next appointment, please call your pharmacy*   Lab Work: Your physician recommends that you return for lab work in: Today   If you have labs (blood work) drawn today and your tests are completely normal, you will receive your results only by: MyChart Message (if you have MyChart) OR A paper copy in the mail If you have any lab test that is abnormal or we need to change your treatment, we will call you to review the results.   Testing/Procedures: Your physician has recommended that you have a Cardioversion (DCCV). Electrical Cardioversion uses a jolt of electricity to your heart either through paddles or wired patches attached to your chest. This is a controlled, usually prescheduled, procedure. Defibrillation is done under light anesthesia in the hospital, and you usually go home the day of the procedure. This is done to get your heart back into a normal rhythm. You  are not awake for the procedure. Please see the instruction sheet given to you today.    Follow-Up: At [facility], you and your health needs are our priority.  As part of our continuing mission to provide you with exceptional heart care, we have created designated Provider Care Teams.  These Care Teams include your primary Cardiologist (physician) and Advanced Practice Providers (APPs -  Physician Assistants and Nurse Practitioners) who all work together to provide you with the care you need, when you need it.  We recommend signing up for the patient portal called "MyChart".  Sign up information is provided on this After Visit Summary.  MyChart is used to connect with patients for Virtual Visits (Telemedicine).  Patients are able to view lab/test results, encounter notes, upcoming appointments, etc.  Non-urgent messages can be sent to your provider as well.   To learn more about what you can do with MyChart, go to [email or web address].    Your next appointment:   1 month(s)  Provider:   You may see Charlton Haws, MD or one of the following Advanced Practice Providers on your designated Care Team:   Randall An, PA-C  Jacolyn Reedy, PA-C     Other Instructions Thank you for choosing [facility]!         Signed, Charlton Haws, MD  10/13/2023 1:48 PM    [facility] [address] [place], [place] [address] Phone: [phone] Fax: [phone]

## 2023-10-13 ENCOUNTER — Encounter: Payer: Self-pay | Admitting: *Deleted

## 2023-10-13 ENCOUNTER — Encounter: Payer: Self-pay | Admitting: Cardiovascular Disease

## 2023-10-13 ENCOUNTER — Ambulatory Visit: Attending: Cardiovascular Disease | Admitting: Cardiovascular Disease

## 2023-10-13 ENCOUNTER — Other Ambulatory Visit: Payer: Self-pay | Admitting: Cardiovascular Disease

## 2023-10-13 ENCOUNTER — Other Ambulatory Visit (HOSPITAL_COMMUNITY)
Admission: RE | Admit: 2023-10-13 | Discharge: 2023-10-13 | Disposition: A | Discharge status: Home / Self Care | Source: Ambulatory Visit | Attending: Cardiovascular Disease | Admitting: Cardiovascular Disease

## 2023-10-13 VITALS — BP 126/96 | HR 117 | Ht 70.0 in | Wt 317.2 lb

## 2023-10-13 DIAGNOSIS — Z794 Long term (current) use of insulin: Secondary | ICD-10-CM

## 2023-10-13 DIAGNOSIS — I77810 Thoracic aortic ectasia: Secondary | ICD-10-CM

## 2023-10-13 DIAGNOSIS — I4819 Other persistent atrial fibrillation: Secondary | ICD-10-CM

## 2023-10-13 DIAGNOSIS — I5043 Acute on chronic combined systolic (congestive) and diastolic (congestive) heart failure: Secondary | ICD-10-CM

## 2023-10-13 DIAGNOSIS — E119 Type 2 diabetes mellitus without complications: Secondary | ICD-10-CM

## 2023-10-13 DIAGNOSIS — I7121 Aneurysm of the ascending aorta, without rupture: Secondary | ICD-10-CM

## 2023-10-13 LAB — CBC WITH DIFFERENTIAL/PLATELET
Abs Immature Granulocytes: 0.07 10*3/uL (ref 0.00–0.07)
Basophils Absolute: 0.1 10*3/uL (ref 0.0–0.1)
Basophils Relative: 1 %
Eosinophils Absolute: 0.2 10*3/uL (ref 0.0–0.5)
Eosinophils Relative: 2 %
HCT: 43.9 % (ref 39.0–52.0)
Hemoglobin: 15.1 g/dL (ref 13.0–17.0)
Immature Granulocytes: 1 %
Lymphocytes Relative: 20 %
Lymphs Abs: 1.8 10*3/uL (ref 0.7–4.0)
MCH: 30.1 pg (ref 26.0–34.0)
MCHC: 34.4 g/dL (ref 30.0–36.0)
MCV: 87.6 fL (ref 80.0–100.0)
Monocytes Absolute: 0.9 10*3/uL (ref 0.1–1.0)
Monocytes Relative: 10 %
Neutro Abs: 6.2 10*3/uL (ref 1.7–7.7)
Neutrophils Relative %: 66 %
Platelets: 225 10*3/uL (ref 150–400)
RBC: 5.01 MIL/uL (ref 4.22–5.81)
RDW: 12.7 % (ref 11.5–15.5)
WBC: 9.2 10*3/uL (ref 4.0–10.5)
nRBC: 0 % (ref 0.0–0.2)

## 2023-10-13 LAB — BASIC METABOLIC PANEL
Anion gap: 8 (ref 5–15)
BUN: 15 mg/dL (ref 6–20)
CO2: 25 mmol/L (ref 22–32)
Calcium: 9.1 mg/dL (ref 8.9–10.3)
Chloride: 99 mmol/L (ref 98–111)
Creatinine, Ser: 0.9 mg/dL (ref 0.61–1.24)
GFR, Estimated: >60 mL/min (ref >60)
Glucose, Bld: 277 mg/dL — ABNORMAL HIGH (ref 70–99)
Potassium: 4.7 mmol/L (ref 3.5–5.1)
Sodium: 132 mmol/L — ABNORMAL LOW (ref 135–145)

## 2023-10-13 LAB — BRAIN NATRIURETIC PEPTIDE: B Natriuretic Peptide: 414 pg/mL — ABNORMAL HIGH (ref 0.0–100.0)

## 2023-10-13 MED ORDER — SPIRONOLACTONE 25 MG PO TABS: 25.0000 mg | ORAL_TABLET | Freq: Every day | ORAL | 3 refills | Status: DC

## 2023-10-13 MED ORDER — METOPROLOL SUCCINATE ER 100 MG PO TB24: 100.0000 mg | ORAL_TABLET | Freq: Every day | ORAL | 3 refills | Status: AC

## 2023-10-13 MED ORDER — FUROSEMIDE 20 MG PO TABS: 20.0000 mg | ORAL_TABLET | Freq: Every day | ORAL | 11 refills | Status: DC | PRN

## 2023-10-13 NOTE — Patient Instructions (Addendum)
 Medication Instructions:  Your physician recommends that you continue on your current medications as directed. Please refer to the Current Medication list given to you today.  Take Lasix 20 mg daily as needed  Increase Toprol XL to 100 mg Daily  Increase Aldactone to 25 mg Daily   *If you need a refill on your cardiac medications before your next appointment, please call your pharmacy*   Lab Work: Your physician recommends that you return for lab work in: Today   If you have labs (blood work) drawn today and your tests are completely normal, you will receive your results only by: MyChart Message (if you have MyChart) OR A paper copy in the mail If you have any lab test that is abnormal or we need to change your treatment, we will call you to review the results.   Testing/Procedures: Your physician has recommended that you have a Cardioversion (DCCV). Electrical Cardioversion uses a jolt of electricity to your heart either through paddles or wired patches attached to your chest. This is a controlled, usually prescheduled, procedure. Defibrillation is done under light anesthesia in the hospital, and you usually go home the day of the procedure. This is done to get your heart back into a normal rhythm. You are not awake for the procedure. Please see the instruction sheet given to you today.    Follow-Up: At [facility], you and your health needs are our priority.  As part of our continuing mission to provide you with exceptional heart care, we have created designated Provider Care Teams.  These Care Teams include your primary Cardiologist (physician) and Advanced Practice Providers (APPs -  Physician Assistants and Nurse Practitioners) who all work together to provide you with the care you need, when you need it.  We recommend signing up for the patient portal called "MyChart".  Sign up information is provided on this After Visit Summary.  MyChart is used to connect with patients  for Virtual Visits (Telemedicine).  Patients are able to view lab/test results, encounter notes, upcoming appointments, etc.  Non-urgent messages can be sent to your provider as well.   To learn more about what you can do with MyChart, go to [email or web address].    Your next appointment:   1 month(s)  Provider:   You may see Charlton Haws, MD or one of the following Advanced Practice Providers on your designated Care Team:   Randall An, PA-C  Jacolyn Reedy, PA-C     Other Instructions Thank you for choosing [facility]!

## 2023-10-14 ENCOUNTER — Encounter: Payer: Self-pay | Admitting: Cardiovascular Disease

## 2023-10-14 ENCOUNTER — Other Ambulatory Visit: Payer: Self-pay

## 2023-10-14 MED ORDER — FUROSEMIDE 20 MG PO TABS: 20.0000 mg | ORAL_TABLET | Freq: Every day | ORAL | 0 refills | Status: AC | PRN

## 2023-10-24 NOTE — Progress Notes (Signed)
 Patient's wife, Corrie Dandy, called inquiring about appointment for Thursday October 27, 2023 due to patient's cell phone not working properly. Pre procedure instructions given including when to arrive, 0800, at front entrance of [facility], verified Xarelto has been taken with no missed doses. Instructed to not take metformin or insulin before procedure. Verified patient will have ride home post procedure and someone will remain with him for the next 24hrs. Wife informed to expect a phone call with same instructions the day before appointment,

## 2023-10-26 NOTE — Progress Notes (Signed)
 Spoke to patient and instructed them to come at 0745  and to be NPO after 0000.  Medications reviewed.    Confirmed that patient will have a ride home and someone to stay with them for 24 hours after the procedure.

## 2023-10-26 NOTE — Anesthesia Preprocedure Evaluation (Signed)
 Anesthesia Evaluation  Patient identified by MRN, date of birth, ID band Patient awake    Reviewed: Allergy & Precautions, NPO status , Patient's Chart, lab work & pertinent test results, reviewed documented beta blocker date and time   Airway Mallampati: III  TM Distance: >3 FB Neck ROM: Full    Dental no notable dental hx. (+) Teeth Intact, Dental Advisory Given   Pulmonary sleep apnea (does not use CPAP)    Pulmonary exam normal breath sounds clear to auscultation       Cardiovascular hypertension, Pt. on home beta blockers and Pt. on medications Normal cardiovascular exam+ dysrhythmias Atrial Fibrillation  Rhythm:Irregular Rate:Normal  TTE 2025 1. Techincally difficult study, even with contrast. Left ventricular  ejection fraction, by estimation, is 50 to 55%. The left ventricle has low  normal function. Left ventricular endocardial border not optimally defined  to evaluate regional wall motion.  There is mild left ventricular hypertrophy. Left ventricular diastolic  parameters are indeterminate.   2. Right ventricular systolic function is mildly reduced. The right  ventricular size is mildly enlarged. There is normal pulmonary artery  systolic pressure.   3. Left atrial size was mildly dilated.   4. The mitral valve is normal in structure. Trivial mitral valve  regurgitation.   5. The aortic valve is tricuspid. Aortic valve regurgitation is not  visualized. No aortic stenosis is present.   6. Aortic dilatation noted. There is dilatation of the ascending aorta,  measuring 41 mm.   7. The inferior vena cava is dilated in size with <50% respiratory  variability, suggesting right atrial pressure of 15 mmHg.     Neuro/Psych negative neurological ROS  negative psych ROS   GI/Hepatic negative GI ROS, Neg liver ROS,,,  Endo/Other  diabetes, Type 2, Insulin Dependent  Class 3 obesity (BMI 46)  Renal/GU negative Renal ROS   negative genitourinary   Musculoskeletal negative musculoskeletal ROS (+)    Abdominal   Peds  Hematology negative hematology ROS (+)   Anesthesia Other Findings   Reproductive/Obstetrics                             Anesthesia Physical Anesthesia Plan  ASA: 3  Anesthesia Plan: General   Post-op Pain Management:    Induction: Intravenous  PONV Risk Score and Plan: Propofol infusion and Treatment may vary due to age or medical condition  Airway Management Planned: Natural Airway  Additional Equipment:   Intra-op Plan:   Post-operative Plan:   Informed Consent: I have reviewed the patients History and Physical, chart, labs and discussed the procedure including the risks, benefits and alternatives for the proposed anesthesia with the patient or authorized representative who has indicated his/her understanding and acceptance.     Dental advisory given  Plan Discussed with: CRNA  Anesthesia Plan Comments:        Anesthesia Quick Evaluation

## 2023-10-27 ENCOUNTER — Other Ambulatory Visit: Payer: Self-pay

## 2023-10-27 ENCOUNTER — Ambulatory Visit (HOSPITAL_COMMUNITY): Admitting: Anesthesiology

## 2023-10-27 ENCOUNTER — Ambulatory Visit (HOSPITAL_COMMUNITY)
Admission: RE | Admit: 2023-10-27 | Discharge: 2023-10-27 | Disposition: A | Discharge status: Home / Self Care | Attending: Cardiology | Admitting: Cardiology

## 2023-10-27 ENCOUNTER — Encounter (HOSPITAL_COMMUNITY)
Admission: RE | Disposition: A | Discharge status: Home / Self Care | Payer: Self-pay | Source: Home / Self Care | Attending: Cardiology

## 2023-10-27 ENCOUNTER — Encounter (HOSPITAL_COMMUNITY): Payer: Self-pay | Admitting: Cardiology

## 2023-10-27 DIAGNOSIS — I4891 Unspecified atrial fibrillation: Secondary | ICD-10-CM

## 2023-10-27 DIAGNOSIS — E66813 Obesity, class 3: Secondary | ICD-10-CM | POA: Insufficient documentation

## 2023-10-27 DIAGNOSIS — G4733 Obstructive sleep apnea (adult) (pediatric): Secondary | ICD-10-CM

## 2023-10-27 DIAGNOSIS — I4819 Other persistent atrial fibrillation: Secondary | ICD-10-CM | POA: Insufficient documentation

## 2023-10-27 DIAGNOSIS — I7121 Aneurysm of the ascending aorta, without rupture: Secondary | ICD-10-CM | POA: Diagnosis not present

## 2023-10-27 DIAGNOSIS — I110 Hypertensive heart disease with heart failure: Secondary | ICD-10-CM | POA: Diagnosis not present

## 2023-10-27 DIAGNOSIS — Z7901 Long term (current) use of anticoagulants: Secondary | ICD-10-CM | POA: Diagnosis not present

## 2023-10-27 DIAGNOSIS — Z7984 Long term (current) use of oral hypoglycemic drugs: Secondary | ICD-10-CM | POA: Diagnosis not present

## 2023-10-27 DIAGNOSIS — Z794 Long term (current) use of insulin: Secondary | ICD-10-CM | POA: Diagnosis not present

## 2023-10-27 DIAGNOSIS — I509 Heart failure, unspecified: Secondary | ICD-10-CM | POA: Diagnosis not present

## 2023-10-27 DIAGNOSIS — I10 Essential (primary) hypertension: Secondary | ICD-10-CM | POA: Diagnosis not present

## 2023-10-27 DIAGNOSIS — Z79899 Other long term (current) drug therapy: Secondary | ICD-10-CM | POA: Insufficient documentation

## 2023-10-27 DIAGNOSIS — Z6841 Body Mass Index (BMI) 40.0 and over, adult: Secondary | ICD-10-CM | POA: Diagnosis not present

## 2023-10-27 DIAGNOSIS — E119 Type 2 diabetes mellitus without complications: Secondary | ICD-10-CM | POA: Diagnosis not present

## 2023-10-27 HISTORY — PX: CARDIOVERSION: EP1203

## 2023-10-27 SURGERY — CARDIOVERSION (CATH LAB)
Anesthesia: General

## 2023-10-27 MED ORDER — LIDOCAINE 2% (20 MG/ML) 5 ML SYRINGE
INTRAMUSCULAR | Status: DC | PRN
  Administered 2023-10-27: 40 mg via INTRAVENOUS

## 2023-10-27 MED ORDER — PROPOFOL 10 MG/ML IV BOLUS
INTRAVENOUS | Status: DC | PRN
  Administered 2023-10-27 (×2): 40 mg via INTRAVENOUS

## 2023-10-27 MED ORDER — SODIUM CHLORIDE 0.9% FLUSH
3.0000 mL | Freq: Two times a day (BID) | INTRAVENOUS | Status: DC
Start: 2023-10-27 — End: 2023-10-27

## 2023-10-27 MED ORDER — SODIUM CHLORIDE 0.9% FLUSH: 3.0000 mL | INTRAVENOUS | Status: DC | PRN

## 2023-10-27 SURGICAL SUPPLY — 1 items: PAD DEFIB RADIO PHYSIO CONN (PAD) ×1 IMPLANT

## 2023-10-27 NOTE — Interval H&P Note (Signed)
 History and Physical Interval Note:  10/27/2023 8:00 AM  Kenneth Bridges  has presented today for surgery, with the diagnosis of afib.  The various methods of treatment have been discussed with the patient and family. After consideration of risks, benefits and other options for treatment, the patient has consented to  Procedure(s): CARDIOVERSION (N/A) as a surgical intervention.  The patient's history has been reviewed, patient examined, no change in status, stable for surgery.  I have reviewed the patient's chart and labs.  Questions were answered to the patient's satisfaction.     [name]

## 2023-10-27 NOTE — Procedures (Signed)
 Electrical Cardioversion Procedure Note Kenneth Bridges [id number] 15-Jun-1966  Procedure: Electrical Cardioversion Indications:  Atrial Fibrillation  Procedure Details Consent: Risks of procedure as well as the alternatives and risks of each were explained to the (patient/caregiver).  Consent for procedure obtained. Time Out: Verified patient identification, verified procedure, site/side was marked, verified correct patient position, special equipment/implants available, medications/allergies/relevent history reviewed, required imaging and test results available.  Performed  Patient placed on cardiac monitor, pulse oximetry, supplemental oxygen as necessary.  Sedation given:  Pt sedated by anesthesia with lidocaine 100 mg and diprovan 70 mg IV. Pacer pads placed anterior and posterior chest.  Cardioverted with 300J resulting in transient sinus but atrial fibrillation recurred. Second attempt with 300J resulted in sinus rhythm.  Evaluation Findings: Post procedure EKG shows: NSR Complications: None Patient did tolerate procedure well.   Kenneth Bridges 10/27/2023, 7:59 AM

## 2023-10-27 NOTE — Anesthesia Postprocedure Evaluation (Signed)
 Anesthesia Post Note  Patient: Kenneth Bridges  Procedure(s) Performed: CARDIOVERSION     Patient location during evaluation: Cath Lab Anesthesia Type: General Level of consciousness: awake and alert Pain management: pain level controlled Vital Signs Assessment: post-procedure vital signs reviewed and stable Respiratory status: spontaneous breathing, nonlabored ventilation, respiratory function stable and patient connected to nasal cannula oxygen Cardiovascular status: blood pressure returned to baseline and stable Postop Assessment: no apparent nausea or vomiting Anesthetic complications: no  No notable events documented.  Last Vitals:  Vitals:   10/27/23 0920 10/27/23 0921  BP: 126/87 126/87  Pulse: 69 61  Resp: (!) 22 19  Temp:    SpO2: 92% 92%    Last Pain:  Vitals:   10/27/23 0902  TempSrc:   PainSc: 0-No pain                 Delonte Musich L Keron Koffman

## 2023-10-27 NOTE — H&P (Signed)
 Office Visit 10/13/2023 [facility], Noralyn Pick, MD Cardiology Persistent atrial fibrillation [facility]) +5 more Dx Referred by Clinic, [facility] Reason for Visit   Additional Documentation  Vitals: BP 126/96 Important    Pulse 117 Important    Ht 5\' 10"  (1.778 m)   Wt 143.9 kg Important    SpO2 94%   BMI 45.51 kg/m   BSA 2.67 m      More Vitals  Flowsheets: Anthropometrics,   NEWS,   MEWS Score,   Vital Signs  Encounter Info: Billing Info,   History,   Allergies,   Detailed Report   All Notes   Progress Notes by Wendall Stade, MD at 10/13/2023 1:00 PM  Author: Wendall Stade, MD Author Type: Physician Filed: 10/13/2023  1:48 PM  Note Status: Signed Cosign: Cosign Not Required Encounter Date: 10/13/2023  Editor: Wendall Stade, MD (Physician)             Expand All Collapse All     Cardiology Office Note     Date:  10/13/2023    ID:  Kenneth Bridges, DOB April 20, 1966, MRN [id number]   PCP:  Clinic, [facility]   Cardiologist: Charlton Haws, MD     No chief complaint on file.     History of Present Illness:     Kenneth Bridges is a 58 y.o. male with past medical history of HTN, Type II DM and OSA who presents to the office today for follow-up.   He was admitted to [facility] 03/03/22  for new-onset atrial fibrillation with RVR and was initially started on IV Cardizem. Echocardiogram did show his EF was mildly reduced at 50 to 55% with apical hypokinesis and mildly dilated left atria. Was also noted to have dilatation of the aortic root measuring 45 mm and dilatation of the ascending aorta at 40 mm. He ultimately required TEE/DCCV which was performed on 03/05/2022 with conversion to NSR with a 200 J biphasic shock. Seen by PA on d/c 03/23/22 and beta blocker d/c diuretics lowered    He has been using his CPAP at night. Also reduced his alcohol use. No  Missed doses of eliquis CHADVASC 3   Could not tolerate  Ozempic with stomach upset and no longer on it    CTA 07/16/23 showed stable 4.3 cm ascending thoracic aneurysm.    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 Knee/ankle pain with prolonged standing    Had recurrent afib with hospitalization in [place] 2.28.25 TTE with EF 50-55% d/c on Toprol 50 mg and cardizem 240 mg with xarelto 20 mg He was not cardioverted and sent home in rate controlled afib.    ECG today afib rate 117 nonspecific ST changes    Long discussion about need for weight loss, compliance with meds and better BS control Still with edema and exertional dyspnea with poor rate control of his afib today   Discussed arranging DCC in 2 weeks since he has been compliant with his eliquis since d/c         Past Medical History:  Diagnosis Date   Atrial fibrillation (HCC)      a. s/p DCCV in 02/2022   Diabetes mellitus without complication (HCC)     Hypertension     Nasal septal deviation      with turbinate hypertrophy ( bilateral)  Sleep apnea     Wears glasses                 Past Surgical History:  Procedure Laterality Date   ANKLE FRACTURE SURGERY Left 1988   CARDIOVERSION N/A 03/05/2022    Procedure: CARDIOVERSION;  Surgeon: Christell Constant, MD;  Location: [facility] ORS;  Service: Cardiovascular;  Laterality: N/A;   COLONOSCOPY N/A 05/19/2016    Procedure: COLONOSCOPY;  Surgeon: Corbin Ade, MD;  Location: [facility] ENDO SUITE;  Service: Endoscopy;  Laterality: N/A;  9:30 am   HERNIA REPAIR Left      inguinal   KNEE ARTHROSCOPY WITH MEDIAL MENISECTOMY Right 08/28/2019    Procedure: KNEE ARTHROSCOPY WITH MEDIAL MENISCECTOMY;  Surgeon: Vickki Hearing, MD;  Location: [facility] ORS;  Service: Orthopedics;  Laterality: Right;   NASAL SEPTOPLASTY W/ TURBINOPLASTY   02/13/2016    NASAL SEPTOPLASTY WITH TURBINATE REDUCTION (Bilateral)   NASAL SEPTOPLASTY W/ TURBINOPLASTY Bilateral 02/13/2016    Procedure: NASAL  SEPTOPLASTY WITH TURBINATE REDUCTION;  Surgeon: Christia Reading, MD;  Location: [facility] OR;  Service: ENT;  Laterality: Bilateral;   SYNDESMOSIS REPAIR Left 11/07/2020    Procedure: SYNDESMOSIS REPAIR AND MEDIAL DELTOID REPAIR LEFT ANKLE;  Surgeon: Vickki Hearing, MD;  Location: [facility] ORS;  Service: Orthopedics;  Laterality: Left;   TEE WITHOUT CARDIOVERSION N/A 03/05/2022    Procedure: TRANSESOPHAGEAL ECHOCARDIOGRAM (TEE);  Surgeon: Christell Constant, MD;  Location: [facility] ORS;  Service: Cardiovascular;  Laterality: N/A;          Current Medications:       Outpatient Medications Prior to Visit  Medication Sig Dispense Refill   blood glucose meter kit and supplies KIT Dispense based on patient and insurance preference. Use up to four times daily as directed. 1 each 0   diltiazem (CARDIZEM CD) 240 MG 24 hr capsule Take 1 capsule (240 mg total) by mouth daily. 30 capsule 1   insulin glargine-yfgn (SEMGLEE) 100 UNIT/ML Pen INJECT 24 UNITS SUBCUTANEOUSLY EVERY MORNING (USE WITHIN 28 DAYS AFTER OPENING PEN)       insulin lispro (HUMALOG) 100 UNIT/ML cartridge Inject 0.04 mLs (4 Units total) into the skin 3 (three) times daily with meals. 15 mL 11   Insulin Syringe-Needle U-100 (INSULIN SYRINGE .5CC/31GX5/16") 31G X 5/16" 0.5 ML MISC Take as directed 100 each 3   metFORMIN (GLUCOPHAGE-XR) 750 MG 24 hr tablet Take 750 mg by mouth daily with breakfast.       rivaroxaban (XARELTO) 20 MG TABS tablet Take 1 tablet (20 mg total) by mouth daily with supper. 90 tablet 3   sildenafil (VIAGRA) 100 MG tablet Take by mouth as needed.       metoprolol succinate (TOPROL XL) 50 MG 24 hr tablet Take 1 tablet (50 mg total) by mouth daily. 30 tablet 1   spironolactone (ALDACTONE) 25 MG tablet Take 12.5 mg by mouth daily.          No facility-administered medications prior to visit.        Allergies:   Patient has no known allergies.    Social History         Socioeconomic History   Marital status: Married       Spouse name: Not on file   Number of children: Not on file   Years of education: Not on file   Highest education level: Not on file  Occupational History   Not on file  Tobacco Use   Smoking status: Never   Smokeless  tobacco: Never  Vaping Use   Vaping status: Never Used  Substance and Sexual Activity   Alcohol use: Not Currently      Comment: social beer drinker on the weekends (drinks 2 beers on Friday and 2 beers on Saturday)    Drug use: No   Sexual activity: Not on file  Other Topics Concern   Not on file  Social History Narrative   Not on file    Social Drivers of Health        Financial Resource Strain: Not on file  Food Insecurity: No Food Insecurity (10/07/2023)    Hunger Vital Sign     Worried About Running Out of Food in the Last Year: Never true     Ran Out of Food in the Last Year: Never true  Transportation Needs: No Transportation Needs (10/07/2023)    PRAPARE - Therapist, art (Medical): No     Lack of Transportation (Non-Medical): No  Physical Activity: Not on file  Stress: Not on file  Social Connections: Unknown (09/27/2022)    Received from [facility]    Social Network     Social Network: Not on file      Family History:  The patient's family history includes Cancer in his mother.    Review of Systems:     Please see the history of present illness.      All other systems reviewed and are otherwise negative except as noted above.     Physical Exam:     VS:  BP (!) 126/96   Pulse (!) 117   Ht 5\' 10"  (1.778 m)   Wt (!) 317 lb 3.2 oz (143.9 kg)   SpO2 94%   BMI 45.51 kg/m    General: Well developed, well nourished,male appearing in no acute distress. Head: Normocephalic, atraumatic. Neck: No carotid bruits. JVD not elevated.  Lungs: Respirations regular and unlabored, without wheezes or rales.  Heart: Regular rate and rhythm. No S3 or S4.  No murmur, no rubs, or gallops appreciated. Abdomen:  Appears non-distended. No obvious abdominal masses. Msk:  Strength and tone appear normal for age. No obvious joint deformities or effusions. Extremities: No clubbing or cyanosis. No pitting edema.  Distal pedal pulses are 2+ bilaterally. Neuro: Alert and oriented X 3. Moves all extremities spontaneously. No focal deficits noted. Psych:  Responds to questions appropriately with a normal affect. Skin: No rashes or lesions noted      Wt Readings from Last 3 Encounters:  10/13/23 (!) 317 lb 3.2 oz (143.9 kg)  10/07/23 (!) 312 lb 2.7 oz (141.6 kg)  08/12/23 (!) 306 lb (138.8 kg)      Studies/Labs Reviewed:    EKG:  EKG is ordered today.  The ekg ordered today demonstrates NSR, HR 69 with RAD and incomplete RBBB.    Recent Labs: 10/08/2023: B Natriuretic Peptide 173.0; TSH 1.930 10/09/2023: ALT 34; BUN 12; Creatinine, Ser 0.86; Hemoglobin 13.8; Magnesium 2.2; Platelets 172; Potassium 3.6; Sodium 133    Lipid Panel Labs (Brief)  No results found for: "CHOL", "TRIG", "HDL", "CHOLHDL", "VLDL", "LDLCALC", "LDLDIRECT"     Additional studies/ records that were reviewed today include:    Echocardiogram: 03/04/2022 IMPRESSIONS     1. Left ventricular ejection fraction, by estimation, is 50 to 55%. The  left ventricle has low normal function. The left ventricle demonstrates  regional wall motion abnormalities. Apical hypokinesis. There is mild left  ventricular hypertrophy. Left  ventricular diastolic parameters are indeterminate.   2. Right ventricular systolic function is normal. The right ventricular  size is normal.   3. Left atrial size was mildly dilated.   4. Right atrial size was mildly dilated.   5. The mitral valve is normal in structure. Trivial mitral valve  regurgitation. No evidence of mitral stenosis.   6. The aortic valve was not well visualized. Aortic valve regurgitation  is not visualized. No aortic stenosis is present.   7. Aortic dilatation noted. There is dilatation  of the aortic root,  measuring 45 mm. There is dilatation of the ascending aorta, measuring 40  mm.   8. The inferior vena cava is dilated in size with >50% respiratory  variability, suggesting right atrial pressure of 8 mmHg.      Plan:    In order of problems listed above:   1. Persistent Atrial Fibrillation - He underwent successful TEE/DCCV on 03/05/2022 with conversion to NSR with a 200 J biphasic shock. [place] VASC 3 for low EF, HTN and aortic plaque on TEE Recurrence 10/07/23 not DCC due to missed doses of xarelto. TTE with similar EF 50-55% Increase Toprol to 100 mg for rate control DCC in 2 weeks will do at [facility] and if not successful will need to be hospitalized for AAT Lab called orders written Scheduled with Dr Jens Som for 10/27/23    2. Aortic Root Dilatation - Stable 4.3 cm aneurysm by CTA 07/16/23    3. HTN - [facility]  on micardis , Toprol and diuretic       4. Type 2 DM - Hgb A1c was elevated to 11.0 during his recent admission. He has been taking Metformin along with Insulin. Ozempic d/c Trying to do low carb diet  Speak with primary about Wegovy or other GLP-1 due to intolerance to Ozempic     5. CHF:  he has weight gain, edema and dyspnea likely related to rapid afib. Increase aldactone to 25 mg add PRN lasix Check labs today   BMET/CBC/BNP Increase Toprol to 100 mg Increase aldactone to 25 mg Add PRN Lasix 20 mg Speak with primary about alternative to Ozempic      F/U post [facility]   Medication Adjustments/Labs and Tests Ordered: Current medicines are reviewed at length with the patient today.  Concerns regarding medicines are outlined above.  Medication changes, Labs and Tests ordered today are listed in the Patient Instructions below. Patient Instructions  Medication Instructions:  Your physician recommends that you continue on your current medications as directed. Please refer to the Current Medication list given to you today.   Take Lasix 20 mg daily  as needed  Increase Toprol XL to 100 mg Daily  Increase Aldactone to 25 mg Daily    *If you need a refill on your cardiac medications before your next appointment, please call your pharmacy*     Lab Work: Your physician recommends that you return for lab work in: Today    If you have labs (blood work) drawn today and your tests are completely normal, you will receive your results only by: MyChart Message (if you have MyChart) OR A paper copy in the mail If you have any lab test that is abnormal or we need to change your treatment, we will call you to review the results.     Testing/Procedures: Your physician has recommended that you have a Cardioversion (DCCV). Electrical Cardioversion uses a jolt of electricity to your heart either through  paddles or wired patches attached to your chest. This is a controlled, usually prescheduled, procedure. Defibrillation is done under light anesthesia in the hospital, and you usually go home the day of the procedure. This is done to get your heart back into a normal rhythm. You are not awake for the procedure. Please see the instruction sheet given to you today.       Follow-Up: At [facility], you and your health needs are our priority.  As part of our continuing mission to provide you with exceptional heart care, we have created designated Provider Care Teams.  These Care Teams include your primary Cardiologist (physician) and Advanced Practice Providers (APPs -  Physician Assistants and Nurse Practitioners) who all work together to provide you with the care you need, when you need it.   We recommend signing up for the patient portal called "MyChart".  Sign up information is provided on this After Visit Summary.  MyChart is used to connect with patients for Virtual Visits (Telemedicine).  Patients are able to view lab/test results, encounter notes, upcoming appointments, etc.  Non-urgent messages can be sent to your provider as well.   To  learn more about what you can do with MyChart, go to [email or web address].     Your next appointment:   1 month(s)   Provider:   You may see Charlton Haws, MD or one of the following Advanced Practice Providers on your designated Care Team:   Randall An, PA-C  Jacolyn Reedy, PA-C      Other Instructions Thank you for choosing [facility]!           Signed, Charlton Haws, MD  10/13/2023 1:48 PM    [facility] [address] [place], [place] [address] Phone: [phone] Fax: [phone]      For DCCV; continue xarelto; no changes [name]

## 2023-10-27 NOTE — Transfer of Care (Signed)
 Immediate Anesthesia Transfer of Care Note  Patient: Kenneth Bridges  Procedure(s) Performed: CARDIOVERSION  Patient Location: PACU  Anesthesia Type:MAC  Level of Consciousness: awake, alert , and oriented  Airway & Oxygen Therapy: Patient Spontanous Breathing and Patient connected to nasal cannula oxygen  Post-op Assessment: Report given to RN and Post -op Vital signs reviewed and stable  Post vital signs: Reviewed  Last Vitals:  Vitals Value Taken Time  BP    Temp    Pulse    Resp    SpO2      Last Pain:  Vitals:   10/27/23 0800  TempSrc:   PainSc: 0-No pain         Complications: No notable events documented.

## 2023-10-28 ENCOUNTER — Encounter (HOSPITAL_COMMUNITY): Payer: Self-pay | Admitting: Cardiology

## 2023-11-22 ENCOUNTER — Encounter: Payer: Self-pay | Admitting: Cardiovascular Disease

## 2023-11-22 ENCOUNTER — Other Ambulatory Visit: Payer: Self-pay

## 2023-11-22 MED ORDER — DILTIAZEM HCL ER COATED BEADS 240 MG PO CP24: 240.0000 mg | ORAL_CAPSULE | Freq: Every day | ORAL | 3 refills | Status: AC

## 2023-11-29 ENCOUNTER — Ambulatory Visit: Admitting: Student

## 2023-12-09 ENCOUNTER — Ambulatory Visit: Attending: Student | Admitting: Student

## 2023-12-09 ENCOUNTER — Encounter: Payer: Self-pay | Admitting: Student

## 2023-12-09 VITALS — BP 148/82 | HR 77 | Ht 71.0 in | Wt 313.0 lb

## 2023-12-09 DIAGNOSIS — G4733 Obstructive sleep apnea (adult) (pediatric): Secondary | ICD-10-CM

## 2023-12-09 DIAGNOSIS — R600 Localized edema: Secondary | ICD-10-CM

## 2023-12-09 DIAGNOSIS — I4819 Other persistent atrial fibrillation: Secondary | ICD-10-CM | POA: Diagnosis not present

## 2023-12-09 DIAGNOSIS — I7121 Aneurysm of the ascending aorta, without rupture: Secondary | ICD-10-CM | POA: Diagnosis not present

## 2023-12-09 DIAGNOSIS — I10 Essential (primary) hypertension: Secondary | ICD-10-CM

## 2023-12-09 DIAGNOSIS — Z7901 Long term (current) use of anticoagulants: Secondary | ICD-10-CM | POA: Diagnosis not present

## 2023-12-09 DIAGNOSIS — Z79899 Other long term (current) drug therapy: Secondary | ICD-10-CM

## 2023-12-09 MED ORDER — TELMISARTAN 40 MG PO TABS: 40.0000 mg | ORAL_TABLET | Freq: Every day | ORAL | 3 refills | Status: AC

## 2023-12-09 NOTE — Progress Notes (Signed)
 Cardiology Office Note    Date:  12/09/2023  ID:  Kenneth Bridges, DOB [birth year], MRN [id number] Cardiologist: Janelle Mediate, MD    History of Present Illness:    Kenneth Bridges is a 58 y.o. male with past medical history of persistent atrial fibrillation (s/p DCCV in 02/2022), aortic root dilatation, HTN, Type 2 DM and OSA who presents to the office today for follow-up from his recent cardioversion.  He was admitted to [facility] in 09/2023 for evaluation of worsening shortness of breath and fatigue for the past few weeks. He had missed doses of Xarelto  and was not a candidate for DCCV while in the ED. Initially required IV Cardizem  but was transitioned to Cardizem  CD 240 mg daily and Toprol -XL 50 mg daily at discharge while being continued on Xarelto  for anticoagulation. He did follow-up with Dr. Stann Earnest on 10/13/2023 and was still in atrial fibrillation with RVR with heart rate at 117 bpm. He had been compliant with anticoagulation and was recommended to arrange for DCCV in 2 weeks and Toprol -XL was increased to 100 mg daily. Was also felt to be volume overloaded by examination and Spironolactone  was increased to 25 mg daily and he was started on PRN Lasix .  He underwent DCCV by Dr. Audery Blazing on 10/27/2023 and was initially shocked at 300 J with transient sinus rhythm but recurrent atrial fibrillation. Underwent second attempt with 300 J and converted to normal sinus rhythm and maintained NSR while monitored after the procedure.   In talking with the patient today, he reports overall feeling well since his recent cardioversion. Denies any recurrent shortness of breath or fatigue resembling when he was in atrial fibrillation. No recent chest pain or palpitations. HR has been in the mid-50's to 90's when checked at home. No significant bradycardia. Still having some lower extremity edema but this is mostly isolated along his left ankle and he has had surgery there before. Reports good compliance  with his medications but is concerned that his blood pressure has been elevated over the past several weeks. Reports Telmisartan  was previously discontinued and he questions if this to be restarted. Remains on Xarelto  for anticoagulation with no reports of melena, hematochezia or hematuria.   Studies Reviewed:   EKG: EKG is ordered today and demonstrates:   EKG Interpretation Date/Time:  Friday Dec 09 2023 13:11:54 EDT Ventricular Rate:  70 PR Interval:  180 QRS Duration:  84 QT Interval:  396 QTC Calculation: 427 R Axis:   132  Text Interpretation: Normal sinus rhythm Right axis deviation No acute ST changes. Confirmed by Woodfin Hays ([id number]) on 12/09/2023 1:16:17 PM       Echocardiogram: 10/2023 IMPRESSIONS     1. Techincally difficult study, even with contrast. Left ventricular  ejection fraction, by estimation, is 50 to 55%. The left ventricle has low  normal function. Left ventricular endocardial border not optimally defined  to evaluate regional wall motion.  There is mild left ventricular hypertrophy. Left ventricular diastolic  parameters are indeterminate.   2. Right ventricular systolic function is mildly reduced. The right  ventricular size is mildly enlarged. There is normal pulmonary artery  systolic pressure.   3. Left atrial size was mildly dilated.   4. The mitral valve is normal in structure. Trivial mitral valve  regurgitation.   5. The aortic valve is tricuspid. Aortic valve regurgitation is not  visualized. No aortic stenosis is present.   6. Aortic dilatation noted. There is dilatation of the ascending aorta,  measuring 41 mm.   7. The inferior vena cava is dilated in size with <50% respiratory  variability, suggesting right atrial pressure of 15 mmHg.    Risk Assessment/Calculations:    CHA2DS2-VASc Score = 3   This indicates a 3.2% annual risk of stroke. The patient's score is based upon: CHF History: 0 HTN History: 1 Diabetes History:  1 Stroke History: 0 Vascular Disease History: 1 Age Score: 0 Gender Score: 0    HYPERTENSION CONTROL Vitals:   12/09/23 1302 12/09/23 1336  BP: (!) 150/98 (!) 148/82    The patient's blood pressure is elevated above target today.  In order to address the patient's elevated BP: A new medication was prescribed today.       Physical Exam:   VS:  BP (!) 148/82   Pulse 77   Ht 5\' 11"  (1.803 m)   Wt (!) 313 lb (142 kg)   SpO2 97%   BMI 43.65 kg/m    Wt Readings from Last 3 Encounters:  12/09/23 (!) 313 lb (142 kg)  10/13/23 (!) 317 lb 3.2 oz (143.9 kg)  10/07/23 (!) 312 lb 2.7 oz (141.6 kg)     GEN: Well nourished, well developed male appearing in no acute distress NECK: No JVD; No carotid bruits CARDIAC: RRR, no murmurs, rubs, gallops RESPIRATORY:  Clear to auscultation without rales, wheezing or rhonchi  ABDOMEN: Appears non-distended. No obvious abdominal masses. EXTREMITIES: No clubbing or cyanosis. Trace ankle edema bilaterally. Distal pedal pulses are 2+ bilaterally.   Assessment and Plan:   1. Persistent Atrial Fibrillation/Use of Long-term Anticoagulation - He recently underwent DCCV in 10/2023 and is maintaining normal sinus rhythm by examination and repeat EKG today. Denies any recent palpitations and heart rate has been well-controlled when checked at home. Continue Cardizem  CD 240 mg daily and Toprol -XL 100 mg daily for rate-control. - No reports of active bleeding. Continue Xarelto  20 mg daily for anticoagulation which is the appropriate dose given his CrCl of 177 mL/min.   2. Thoracic Aortic Aneurysm - This measured 4.3 cm by CTA in 07/2023. 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 Would plan for follow-up imaging in 07/2024 for reassessment.  3. Lower Extremity Edema - He reports symptoms have  improved since his recent cardioversion. He does have Lasix  to take as needed but does not utilize this regularly. Will continue on Spironolactone  25 mg daily. Will recheck a BNP and BMET with his next set of labs (BNP previously elevated in the setting of atrial fibrillation with RVR).   4. HTN - Blood pressure was initially recorded at 150/98, rechecked and at 148/82. He reports it has been elevated when checked at other office visits within the past few weeks. He was previously on Telmisartan  80 mg daily but this was discontinued during his hospitalization. Will restart at 40 mg daily and obtain a follow-up BMET in 2 weeks. Offered to arrange a nurse visit for BP check in a few weeks but he prefers to follow BP in the ambulatory setting and return a BP log. Based off readings, may need to titrate to his prior dose of 80 mg daily. Continue Cardizem  CD 240 mg daily, Toprol -XL 100 mg daily and Spironolactone  25 mg daily.  5. OSA - Reports he has been intolerant to CPAP due to difficulty breathing with the machine  due to having his turbinate scraped in the past. He is followed by Pulmonology at the [facility] and recently underwent a repeat sleep study. He is planning to review the Inspire device with them in the future as he is open to pursuing this if a candidate.   Signed, Dorma Gash, PA-C

## 2023-12-09 NOTE — Patient Instructions (Signed)
 Medication Instructions:  Your physician has recommended you make the following change in your medication:   Start Telmisartan  40 mg Daily   Monitor Blood Pressure for 1 month and report readings   *If you need a refill on your cardiac medications before your next appointment, please call your pharmacy*  Lab Work: Your physician recommends that you return for lab work in: 2 Weeks   If you have labs (blood work) drawn today and your tests are completely normal, you will receive your results only by: [employer] (if you have MyChart) OR A paper copy in the mail If you have any lab test that is abnormal or we need to change your treatment, we will call you to review the results.  Testing/Procedures: NONE   Follow-Up: At [facility], you and your health needs are our priority.  As part of our continuing mission to provide you with exceptional heart care, our providers are all part of one team.  This team includes your primary Cardiologist (physician) and Advanced Practice Providers or APPs (Physician Assistants and Nurse Practitioners) who all work together to provide you with the care you need, when you need it.  Your next appointment:   3-4 month(s)  Provider:   Janelle Mediate, MD or Woodfin Hays, PA-C    We recommend signing up for the patient portal called "MyChart".  Sign up information is provided on this After Visit Summary.  MyChart is used to connect with patients for Virtual Visits (Telemedicine).  Patients are able to view lab/test results, encounter notes, upcoming appointments, etc.  Non-urgent messages can be sent to your provider as well.   To learn more about what you can do with MyChart, go to [email or web address].   Other Instructions Thank you for choosing [facility]!

## 2024-01-05 ENCOUNTER — Ambulatory Visit: Payer: Self-pay | Admitting: Student

## 2024-01-05 ENCOUNTER — Encounter: Payer: Self-pay | Admitting: Student

## 2024-01-05 ENCOUNTER — Other Ambulatory Visit (HOSPITAL_COMMUNITY)
Admission: RE | Admit: 2024-01-05 | Discharge: 2024-01-05 | Disposition: A | Discharge status: Home / Self Care | Source: Ambulatory Visit | Attending: Student | Admitting: Student

## 2024-01-05 DIAGNOSIS — R600 Localized edema: Secondary | ICD-10-CM | POA: Insufficient documentation

## 2024-01-05 DIAGNOSIS — Z79899 Other long term (current) drug therapy: Secondary | ICD-10-CM | POA: Diagnosis present

## 2024-01-05 LAB — BASIC METABOLIC PANEL WITH GFR
Anion gap: 11 (ref 5–15)
BUN: 20 mg/dL (ref 6–20)
CO2: 24 mmol/L (ref 22–32)
Calcium: 9.2 mg/dL (ref 8.9–10.3)
Chloride: 97 mmol/L — ABNORMAL LOW (ref 98–111)
Creatinine, Ser: 1.08 mg/dL (ref 0.61–1.24)
GFR, Estimated: >60 mL/min (ref >60)
Glucose, Bld: 212 mg/dL — ABNORMAL HIGH (ref 70–99)
Potassium: 5.2 mmol/L — ABNORMAL HIGH (ref 3.5–5.1)
Sodium: 132 mmol/L — ABNORMAL LOW (ref 135–145)

## 2024-01-05 LAB — BRAIN NATRIURETIC PEPTIDE: B Natriuretic Peptide: 40 pg/mL (ref 0.0–100.0)

## 2024-01-05 MED ORDER — SPIRONOLACTONE 25 MG PO TABS: 12.5000 mg | ORAL_TABLET | Freq: Every day | ORAL | 3 refills | Status: AC

## 2024-01-05 NOTE — Telephone Encounter (Signed)
 The patient has been notified of the result and verbalized understanding.  All questions (if any) were answered. Carole Churches, LPN 03/19/9146 82:95 AM

## 2024-01-05 NOTE — Telephone Encounter (Signed)
-----   Message from Dorma Gash sent at 01/05/2024  9:31 AM EDT ----- Please let the patient know that his fluid level has normalized with BNP at 40. Kidney function remains stable.  Potassium is mildly elevated at 5.2 and sodium low at 132. He is not listed as taking potassium supplementation but would confirm this. If not taking potassium supplementation, would reduce Spironolactone  to 12.5 mg daily as this can cause elevated potassium. Recheck BMET next week.

## 2024-01-06 ENCOUNTER — Ambulatory Visit: Payer: Self-pay | Admitting: Student

## 2024-01-31 ENCOUNTER — Telehealth: Payer: Self-pay | Admitting: Cardiovascular Disease

## 2024-01-31 NOTE — Telephone Encounter (Signed)
 Requesting cb (one on one w/ attorney) from Dr. Delford to discuss pt's medical history, answer questions for S.S./Disability claim

## 2024-02-17 NOTE — Telephone Encounter (Signed)
 MD message relayed to law firm.

## 2024-02-17 NOTE — Telephone Encounter (Signed)
 The Attorney office is calling back to check the status of this message. Please advise

## 2024-03-07 ENCOUNTER — Other Ambulatory Visit (HOSPITAL_COMMUNITY): Payer: Self-pay | Admitting: Internal Medicine

## 2024-03-07 DIAGNOSIS — E279 Disorder of adrenal gland, unspecified: Secondary | ICD-10-CM

## 2024-03-24 ENCOUNTER — Ambulatory Visit (HOSPITAL_BASED_OUTPATIENT_CLINIC_OR_DEPARTMENT_OTHER)
Admission: RE | Admit: 2024-03-24 | Discharge: 2024-03-24 | Disposition: A | Discharge status: Home / Self Care | Source: Ambulatory Visit | Attending: Internal Medicine | Admitting: Internal Medicine

## 2024-03-24 DIAGNOSIS — E279 Disorder of adrenal gland, unspecified: Secondary | ICD-10-CM | POA: Diagnosis present

## 2024-03-24 LAB — POCT I-STAT CREATININE: Creatinine, Ser: 1.1 mg/dL (ref 0.61–1.24)

## 2024-03-24 MED ORDER — IOHEXOL 300 MG/ML  SOLN
100.0000 mL | Freq: Once | INTRAMUSCULAR | Status: AC | PRN
  Administered 2024-03-24: 100 mL via INTRAVENOUS

## 2024-04-10 NOTE — Progress Notes (Signed)
 Cardiology Office Note    Date:  04/19/2024  ID:  Kenneth Bridges, DOB October 24, 1965, MRN [id number] Cardiologist: Maude Emmer, MD    History of Present Illness:    Kenneth Bridges is a 58 y.o. male with past medical history of persistent atrial fibrillation post [facility], aortic root dilatation, HTN, Type 2 DM and OSA who presents to the office today for follow-up   He was admitted to [facility] in 09/2023 for evaluation of worsening shortness of breath and fatigue for the past few weeks. He had missed doses of Xarelto  and was not a candidate for DCCV while in the ED. Initially required IV Cardizem  but was transitioned to Cardizem  CD 240 mg daily and Toprol -XL 100 mg.   He underwent DCCV by Dr. Pietro on 10/27/2023 and was initially shocked at 300 J with transient sinus rhythm but recurrent atrial fibrillation. Underwent second attempt with 300 J and converted to normal sinus rhythm and maintained NSR while monitored after the procedure.   In talking with the patient today, he reports overall feeling well No palpitations or high HR;s. Still having some lower extremity edema but this is mostly isolated along his left ankle and he has had surgery there before. Reports good compliance with his medications Micardis  restarted for BP 12/09/23   Been on Zepbound for 3 months weight still up. Going to [place] to inquire about CPAP /Aspire devices.   Married but lives apart from wife Two older kids Driving senior citizen vehicle to doctor appointments   Studies Reviewed:   EKG: SR rate 70 RAD 12/09/23    Echocardiogram: 10/2023 IMPRESSIONS     1. Techincally difficult study, even with contrast. Left ventricular  ejection fraction, by estimation, is 50 to 55%. The left ventricle has low  normal function. Left ventricular endocardial border not optimally defined  to evaluate regional wall motion.  There is mild left ventricular hypertrophy. Left ventricular diastolic  parameters are  indeterminate.   2. Right ventricular systolic function is mildly reduced. The right  ventricular size is mildly enlarged. There is normal pulmonary artery  systolic pressure.   3. Left atrial size was mildly dilated.   4. The mitral valve is normal in structure. Trivial mitral valve  regurgitation.   5. The aortic valve is tricuspid. Aortic valve regurgitation is not  visualized. No aortic stenosis is present.   6. Aortic dilatation noted. There is dilatation of the ascending aorta,  measuring 41 mm.   7. The inferior vena cava is dilated in size with <50% respiratory  variability, suggesting right atrial pressure of 15 mmHg.    Risk Assessment/Calculations:    CHA2DS2-VASc Score =   3  This indicates a 3.2 % annual risk of stroke. The patient's score is based upon:     Physical Exam:   VS:  There were no vitals taken for this visit.   Wt Readings from Last 3 Encounters:  12/09/23 (!) 313 lb (142 kg)  10/13/23 (!) 317 lb 3.2 oz (143.9 kg)  10/07/23 (!) 312 lb 2.7 oz (141.6 kg)     Affect appropriate Obese male HEENT: normal Neck supple with no adenopathy JVP normal no bruits no thyromegaly Lungs clear with no wheezing and good diaphragmatic motion Heart:  S1/S2 no murmur, no rub, gallop or click PMI normal Abdomen: benighn, BS positve, no tenderness, no AAA no bruit.  No HSM or HJR Distal pulses intact with no bruits Plus one LLE edema Neuro non-focal Skin warm  and dry No muscular weakness    Assessment and Plan:   1. Persistent Atrial Fibrillation/Use of Long-term Anticoagulation - DCCV in 10/2023 and is maintaining normal sinus rhythm by examination and repeat EKG today. Denies any recent palpitations and heart rate has been well-controlled when checked at home. Continue Cardizem  CD 240 mg daily and Toprol -XL 100 mg daily for rate-control. - No reports of active bleeding. Continue Xarelto  20 mg daily for anticoagulation which is the appropriate dose   2.  Thoracic Aortic Aneurysm - This measured 4.3 cm by CTA in 07/2023. 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 Lower Extremity Edema - He reports symptoms have improved since his cardioversion. He does have Lasix  to take as needed but does not utilize this regularly. Will continue on Spironolactone  25 mg daily. Limit salt, and weight loss    4. HTN - improved on current Rx - needs to lose weight  5. OSA - Reports he has been intolerant to CPAP due to difficulty breathing with the machine Again discussed Inspire device F/U with ENT   Aortic MRA 07/2024 BMET at that time  F/U in a year   Signed, Maude Emmer, MD

## 2024-04-19 ENCOUNTER — Ambulatory Visit: Attending: Cardiovascular Disease | Admitting: Cardiovascular Disease

## 2024-04-19 VITALS — BP 138/85 | HR 75 | Wt 309.2 lb

## 2024-04-19 DIAGNOSIS — I480 Paroxysmal atrial fibrillation: Secondary | ICD-10-CM | POA: Diagnosis not present

## 2024-04-19 DIAGNOSIS — R600 Localized edema: Secondary | ICD-10-CM

## 2024-04-19 DIAGNOSIS — I10 Essential (primary) hypertension: Secondary | ICD-10-CM

## 2024-04-19 DIAGNOSIS — I77810 Thoracic aortic ectasia: Secondary | ICD-10-CM

## 2024-04-19 NOTE — Patient Instructions (Addendum)
 Medication Instructions:   Your physician recommends that you continue on your current medications as directed. Please refer to the Current Medication list given to you today.   Labwork: BMET  1  week before MRA  Testing/Procedures: Schedule Aortic MRA  Follow-Up:  1 year  Any Other Special Instructions Will Be Listed Below (If Applicable).  If you need a refill on your cardiac medications before your next appointment, please call your pharmacy.

## 2024-07-16 ENCOUNTER — Ambulatory Visit (HOSPITAL_COMMUNITY)
Admission: RE | Admit: 2024-07-16 | Discharge: 2024-07-16 | Payer: Self-pay | Attending: Cardiovascular Disease | Admitting: Cardiovascular Disease

## 2024-07-16 DIAGNOSIS — I77810 Thoracic aortic ectasia: Secondary | ICD-10-CM

## 2024-07-16 MED ORDER — GADOBUTROL 1 MMOL/ML IV SOLN
10.0000 mL | Freq: Once | INTRAVENOUS | Status: AC | PRN
  Administered 2024-07-16: 10 mL via INTRAVENOUS

## 2024-07-17 ENCOUNTER — Ambulatory Visit: Payer: Self-pay | Admitting: Cardiovascular Disease
